# Patient Record
Sex: Male | Born: 1935 | Race: White | Hispanic: No | Marital: Single | Smoking: Current every day smoker
Health system: Southern US, Community
[De-identification: ages and names within clinical notes are randomized; demographics above are authoritative.]

## PROBLEM LIST (undated history)

## (undated) DIAGNOSIS — K219 Gastro-esophageal reflux disease without esophagitis: Secondary | ICD-10-CM

## (undated) DIAGNOSIS — J410 Simple chronic bronchitis: Secondary | ICD-10-CM

## (undated) DIAGNOSIS — N529 Male erectile dysfunction, unspecified: Secondary | ICD-10-CM

## (undated) DIAGNOSIS — N400 Enlarged prostate without lower urinary tract symptoms: Secondary | ICD-10-CM

## (undated) DIAGNOSIS — J449 Chronic obstructive pulmonary disease, unspecified: Secondary | ICD-10-CM

## (undated) DIAGNOSIS — I10 Essential (primary) hypertension: Secondary | ICD-10-CM

## (undated) HISTORY — PX: INGUINAL HERNIA REPAIR: SUR1180

---

## 2012-12-14 ENCOUNTER — Inpatient Hospital Stay: Payer: Self-pay | Admitting: Internal Medicine

## 2012-12-14 LAB — DIFFERENTIAL
Basophil #: 0 10*3/uL (ref 0.0–0.1)
Eosinophil %: 0.3 %
Lymphocyte #: 1.1 10*3/uL (ref 1.0–3.6)
Lymphocyte %: 9.8 %
Monocyte #: 0.6 x10 3/mm (ref 0.2–1.0)
Monocyte %: 5.3 %

## 2012-12-14 LAB — COMPREHENSIVE METABOLIC PANEL
Albumin: 3.3 g/dL — ABNORMAL LOW (ref 3.4–5.0)
Alkaline Phosphatase: 80 U/L (ref 50–136)
Anion Gap: 8 (ref 7–16)
Calcium, Total: 6.8 mg/dL — CL (ref 8.5–10.1)
Chloride: 99 mmol/L (ref 98–107)
Creatinine: 1 mg/dL (ref 0.60–1.30)
EGFR (African American): >60
EGFR (Non-African Amer.): >60
Glucose: 186 mg/dL — ABNORMAL HIGH (ref 65–99)
Potassium: 2.7 mmol/L — ABNORMAL LOW (ref 3.5–5.1)
SGPT (ALT): 22 U/L (ref 12–78)

## 2012-12-14 LAB — CBC
MCH: 30.6 pg (ref 26.0–34.0)
Platelet: 254 10*3/uL (ref 150–440)
RBC: 4.39 10*6/uL — ABNORMAL LOW (ref 4.40–5.90)
RDW: 14.2 % (ref 11.5–14.5)
WBC: 10.9 10*3/uL — ABNORMAL HIGH (ref 3.8–10.6)

## 2012-12-14 LAB — URINALYSIS, COMPLETE
Bacteria: NONE SEEN
Glucose,UR: NEGATIVE mg/dL (ref 0–75)
Ketone: NEGATIVE
Leukocyte Esterase: NEGATIVE
Nitrite: NEGATIVE
Ph: 6 (ref 4.5–8.0)
Protein: NEGATIVE
RBC,UR: <1 /HPF (ref 0–5)
WBC UR: 1 /HPF (ref 0–5)

## 2012-12-14 LAB — MAGNESIUM: Magnesium: <0.3 mg/dL

## 2012-12-14 LAB — TROPONIN I: Troponin-I: 0.03 ng/mL

## 2012-12-14 LAB — ETHANOL: Ethanol %: <0.003 % (ref 0.000–0.080)

## 2012-12-15 LAB — COMPREHENSIVE METABOLIC PANEL
Albumin: 2.9 g/dL — ABNORMAL LOW (ref 3.4–5.0)
Alkaline Phosphatase: 59 U/L (ref 50–136)
BUN: 8 mg/dL (ref 7–18)
Calcium, Total: 7.3 mg/dL — ABNORMAL LOW (ref 8.5–10.1)
Co2: 29 mmol/L (ref 21–32)
Creatinine: 0.88 mg/dL (ref 0.60–1.30)
EGFR (Non-African Amer.): >60
Osmolality: 272 (ref 275–301)
Potassium: 3.8 mmol/L (ref 3.5–5.1)
SGPT (ALT): 20 U/L (ref 12–78)

## 2012-12-15 LAB — CBC WITH DIFFERENTIAL/PLATELET
Basophil %: 0.5 %
Eosinophil #: 0.1 10*3/uL (ref 0.0–0.7)
HCT: 34.8 % — ABNORMAL LOW (ref 40.0–52.0)
Lymphocyte #: 1 10*3/uL (ref 1.0–3.6)
MCH: 30.6 pg (ref 26.0–34.0)
MCHC: 34.2 g/dL (ref 32.0–36.0)
Monocyte %: 6.5 %
Neutrophil #: 7.4 10*3/uL — ABNORMAL HIGH (ref 1.4–6.5)
Neutrophil %: 81.1 %
Platelet: 222 10*3/uL (ref 150–440)
WBC: 9.1 10*3/uL (ref 3.8–10.6)

## 2012-12-15 LAB — MAGNESIUM: Magnesium: 1.6 mg/dL — ABNORMAL LOW

## 2012-12-16 LAB — BASIC METABOLIC PANEL
Calcium, Total: 7.6 mg/dL — ABNORMAL LOW (ref 8.5–10.1)
Chloride: 103 mmol/L (ref 98–107)
EGFR (African American): >60
EGFR (Non-African Amer.): >60
Glucose: 112 mg/dL — ABNORMAL HIGH (ref 65–99)
Potassium: 3.9 mmol/L (ref 3.5–5.1)
Sodium: 134 mmol/L — ABNORMAL LOW (ref 136–145)

## 2013-01-26 ENCOUNTER — Inpatient Hospital Stay: Payer: Self-pay | Admitting: Internal Medicine

## 2013-01-26 LAB — URINALYSIS, COMPLETE
Bilirubin,UR: NEGATIVE
Blood: NEGATIVE
Glucose,UR: NEGATIVE mg/dL (ref 0–75)
Hyaline Cast: 1
Ketone: NEGATIVE
Nitrite: NEGATIVE
Protein: NEGATIVE
Specific Gravity: 1.009 (ref 1.003–1.030)
Squamous Epithelial: NONE SEEN

## 2013-01-26 LAB — CBC
HCT: 29.6 % — ABNORMAL LOW (ref 40.0–52.0)
HGB: 9.2 g/dL — ABNORMAL LOW (ref 13.0–18.0)
MCH: 27.9 pg (ref 26.0–34.0)
MCHC: 31.1 g/dL — ABNORMAL LOW (ref 32.0–36.0)
Platelet: 228 10*3/uL (ref 150–440)

## 2013-01-26 LAB — DRUG SCREEN, URINE
Amphetamines, Ur Screen: NEGATIVE (ref ?–1000)
Barbiturates, Ur Screen: NEGATIVE (ref ?–200)
Methadone, Ur Screen: NEGATIVE (ref ?–300)
Opiate, Ur Screen: NEGATIVE (ref ?–300)
Phencyclidine (PCP) Ur S: NEGATIVE (ref ?–25)
Tricyclic, Ur Screen: NEGATIVE (ref ?–1000)

## 2013-01-26 LAB — COMPREHENSIVE METABOLIC PANEL
Albumin: 2.5 g/dL — ABNORMAL LOW (ref 3.4–5.0)
Alkaline Phosphatase: 167 U/L — ABNORMAL HIGH
Anion Gap: 17 — ABNORMAL HIGH (ref 7–16)
Bilirubin,Total: 0.8 mg/dL (ref 0.2–1.0)
Calcium, Total: 5.6 mg/dL — CL (ref 8.5–10.1)
Creatinine: 1.03 mg/dL (ref 0.60–1.30)
EGFR (Non-African Amer.): >60
Glucose: 142 mg/dL — ABNORMAL HIGH (ref 65–99)
Osmolality: 273 (ref 275–301)
SGOT(AST): 52 U/L — ABNORMAL HIGH (ref 15–37)
SGPT (ALT): 35 U/L (ref 12–78)
Total Protein: 6.7 g/dL (ref 6.4–8.2)

## 2013-01-26 LAB — PRO B NATRIURETIC PEPTIDE: B-Type Natriuretic Peptide: 22886 pg/mL — ABNORMAL HIGH (ref 0–450)

## 2013-01-26 LAB — KETONES, URINE: Ketone: NEGATIVE

## 2013-01-26 LAB — MAGNESIUM: Magnesium: <0.2 mg/dL — ABNORMAL LOW

## 2013-01-26 LAB — ETHANOL: Ethanol %: 0.007 % (ref 0.000–0.080)

## 2013-01-26 LAB — ACETAMINOPHEN LEVEL: Acetaminophen: <2 ug/mL

## 2013-01-26 LAB — TROPONIN I
Troponin-I: 0.09 ng/mL — ABNORMAL HIGH
Troponin-I: 0.08 ng/mL — ABNORMAL HIGH

## 2013-01-26 LAB — SALICYLATE LEVEL: Salicylates, Serum: 4.3 mg/dL — ABNORMAL HIGH

## 2013-01-26 LAB — LIPASE, BLOOD: Lipase: 88 U/L (ref 73–393)

## 2013-01-26 LAB — TSH: Thyroid Stimulating Horm: 1.9 u[IU]/mL

## 2013-01-27 DIAGNOSIS — I059 Rheumatic mitral valve disease, unspecified: Secondary | ICD-10-CM

## 2013-01-27 LAB — MAGNESIUM: Magnesium: 2.2 mg/dL

## 2013-01-27 LAB — HEMOGLOBIN A1C: Hemoglobin A1C: 5 % (ref 4.2–6.3)

## 2013-01-27 LAB — CBC WITH DIFFERENTIAL/PLATELET
Basophil %: 0.2 %
Eosinophil #: 0.1 10*3/uL (ref 0.0–0.7)
Eosinophil %: 0.6 %
HCT: 25.1 % — ABNORMAL LOW (ref 40.0–52.0)
HGB: 8.3 g/dL — ABNORMAL LOW (ref 13.0–18.0)
MCH: 29.5 pg (ref 26.0–34.0)
Neutrophil #: 8.3 10*3/uL — ABNORMAL HIGH (ref 1.4–6.5)
Neutrophil %: 87.7 %
Platelet: 143 10*3/uL — ABNORMAL LOW (ref 150–440)
RBC: 2.81 10*6/uL — ABNORMAL LOW (ref 4.40–5.90)
RDW: 19.8 % — ABNORMAL HIGH (ref 11.5–14.5)
WBC: 9.4 10*3/uL (ref 3.8–10.6)

## 2013-01-27 LAB — BASIC METABOLIC PANEL
BUN: 15 mg/dL (ref 7–18)
Creatinine: 1.01 mg/dL (ref 0.60–1.30)
EGFR (African American): >60
EGFR (Non-African Amer.): >60
Glucose: 84 mg/dL (ref 65–99)
Potassium: 2.8 mmol/L — ABNORMAL LOW (ref 3.5–5.1)

## 2013-01-27 LAB — TSH: Thyroid Stimulating Horm: 0.8 u[IU]/mL

## 2013-01-28 LAB — CALCIUM: Calcium, Total: 6.2 mg/dL — CL (ref 8.5–10.1)

## 2013-01-31 LAB — CULTURE, BLOOD (SINGLE)

## 2013-02-23 LAB — BASIC METABOLIC PANEL
Anion Gap: 4 — ABNORMAL LOW (ref 7–16)
BUN: 11 mg/dL (ref 7–18)
CO2: 31 mmol/L (ref 21–32)
CREATININE: 0.91 mg/dL (ref 0.60–1.30)
Calcium, Total: 8.4 mg/dL — ABNORMAL LOW (ref 8.5–10.1)
Chloride: 95 mmol/L — ABNORMAL LOW (ref 98–107)
EGFR (African American): >60
EGFR (Non-African Amer.): >60
GLUCOSE: 125 mg/dL — AB (ref 65–99)
Osmolality: 262 (ref 275–301)
POTASSIUM: 3.6 mmol/L (ref 3.5–5.1)
SODIUM: 130 mmol/L — AB (ref 136–145)

## 2013-02-23 LAB — CBC
HCT: 32.5 % — ABNORMAL LOW (ref 40.0–52.0)
HGB: 10.6 g/dL — ABNORMAL LOW (ref 13.0–18.0)
MCH: 29.5 pg (ref 26.0–34.0)
MCHC: 32.5 g/dL (ref 32.0–36.0)
MCV: 91 fL (ref 80–100)
PLATELETS: 212 10*3/uL (ref 150–440)
RBC: 3.58 10*6/uL — ABNORMAL LOW (ref 4.40–5.90)
RDW: 19.2 % — ABNORMAL HIGH (ref 11.5–14.5)
WBC: 9.4 10*3/uL (ref 3.8–10.6)

## 2013-02-23 LAB — TROPONIN I: TROPONIN-I: 0.04 ng/mL

## 2013-02-23 LAB — PRO B NATRIURETIC PEPTIDE: B-TYPE NATIURETIC PEPTID: 24147 pg/mL — AB (ref 0–450)

## 2013-02-24 ENCOUNTER — Observation Stay: Payer: Self-pay | Admitting: Internal Medicine

## 2014-05-30 NOTE — Discharge Summary (Signed)
PATIENT NAME:  Daniel Edwards, Daniel Edwards MR#:  [id number] DATE OF BIRTH:  09-29-1934  DATE OF ADMISSION:  01/26/2013 DATE OF DISCHARGE:  01/28/2013  PRIMARY CARE PHYSICIAN:  None local.   DISCHARGE DIAGNOSES:   1.  Severe hypomagnesemia.  2.  Hypocalcemia.  3.  Sepsis with urinary tract infection.  4.  Left-sided pneumonia.  5.  Tobacco abuse.   6.  Anxiety.   7.  Anemia of chronic disease.  8.  Transaminitis secondary to alcohol abuse.  9.  Hypertension.   CONDITION: Stable.   CODE STATUS: Full code.   HOME MEDICATIONS: Please refer to the [facility] physician discharge instruction medication reconciliation list.    The patient needs home health and with physical therapy, rolling walker.   DIET: Low sodium, low fat, low cholesterol diet.   ACTIVITY: As tolerated.   FOLLOW-UP CARE: Follow with PCP within 1 to 2 weeks.   REASON FOR ADMISSION: Weakness and panic attacks.   HOSPITAL COURSE: The patient is a 80 year old Caucasian male with a history of alcohol abuse, hypertension and recent admission for nausea and vomiting, electrolyte abnormality, who presented to the ED with panic attack. In the ED, the patient received 1 gram of Ativan. He was found to have severe hypomagnesemia. Magnesium level is 0.2 for detailed history and physical examination, please refer to the admission note dictated by Dr. Darvin Neighbours.   LABORATORY DATA ON ADMISSION: Showed glucose 140 and BNP 22,806, BUN 13, creatinine 1.03, sodium 135, potassium 3.9, magnesium less than 0.2, calcium 5.6, anion gap 17,  ALT 35, alkaline phosphatase 167. Troponin 0.09. Urine drug screen is negative. WBC 15.3, hemoglobin 9.2. Urinalysis showed 1+ bacteriuria, WBC 50.   MEDICAL PROBLEM LIST:  1.  For severe electrolyte abnormality. After admission, the patient was treated with aggressive supplement of magnesium. The patient's magnesium level increased to 2.0.  2.  For hypokalemia the patient's potassium was at 2.8 and increased to 4.1 today.   3.  Hypocalcemia. The patient's calcium level was 5.6 and increased to 6.3. Corrected calcium is about 7.5. The patient was treated with a vitamin D with calcium supplement.  4. Sepsis with urinary tract infection and left-sided pneumonia. The patient has been treated with Levaquin and clindamycin. The patient has no complaints of cough, sputum, no fever, chills. The patient will be given Levaquin for 7 days to cover both pneumonia and urinary tract infection.  5.  The patient's WBC was decreased to normal range the antibiotics of treatment.  6.  For alcohol abuse. The patient has been treated with CIWA protocol.  7.  Elevated troponin, which is possibly due to cardiomyopathy, the patient got an echocardiograph, which showed ejection fraction 45% to 50%. The patient has been treated with aspirin, lisinopril and atenolol.  8.  Tobacco abuse. The patient was counseled to quit smoking.  9.  Anemia, stable.  10.  The patient has no complaints. Vital signs are stable. He is clinically stable and will be discharged to home with home health and physical therapy today, according to physical therapy recommendations. I discussed the patient's discharge plan with the patient, nurse and case manager.   TIME SPENT: About 38 minutes.   ____________________________ Demetrios Loll, MD qc:cc D: 01/28/2013 15:56:54 ET T: 01/28/2013 22:38:40 ET JOB#: [id number]  cc: Demetrios Loll, MD, <Dictator> Demetrios Loll MD ELECTRONICALLY SIGNED 01/29/2013 17:04

## 2014-05-30 NOTE — H&P (Signed)
PATIENT NAME:  Daniel Edwards, Daniel Edwards MR#:  [id number] DATE OF BIRTH:  [birth year]  DATE OF ADMISSION:  12/14/2012  PRIMARY CARE PHYSICIAN: None.  CHIEF COMPLAINT: Not feeling well.   HISTORY OF PRESENT ILLNESS: This is a 80 year old man who states that he has not been feeling well for the past 3 or 4 weeks. He has been feeling worse and worse. Some vague symptoms of nausea, vomiting once per day, most of the time in the morning. Some dizziness, trouble walking around and steadiness with his walking. He has had poor appetite and he has been drained with very low energy. On further questioning, he does drink 4 to 5 beers per day. In the ER, he was found to have a low potassium of 2.7, calcium that was low at 6.8 and hospitalist services were contacted for further evaluation for electrolyte abnormalities.   PAST MEDICAL HISTORY: Anemia in the past with blood transfusion and acid reflux.   PAST SURGICAL HISTORY: Hernia repair.   ALLERGIES: No known drug allergies.   HOME MEDICATIONS: Include: Omeprazole 20 mg b.i.d., TUMS p.r.n., ferrous sulfate 1 tablet daily, aspirin 325 mg, I am not sure if it is 2 tablets daily. I am not sure why he is taking that.  Lipo-Flavonoid vitamin.   SOCIAL HISTORY: Smokes 1 pack per day for numerous years. Drinks 4 to 5 beers per day. No drug use. He did work in Engineer, building services. He lives alone.   FAMILY HISTORY: Father died at 6 of an MI. Mother died at 67 of lung cancer.   REVIEW OF SYSTEMS:  No fever. Positive for hot and cold feeling. No weight loss. Positive for weakness, generalized.  EYES: He does wear glasses.  EARS, NOSE, MOUTH, AND THROAT:  Positive buzzing in the ears and allergies. No sore throat. No difficulty swallowing.  CARDIOVASCULAR: No chest pain. No palpitations.  RESPIRATORY: Occasional shortness of breath. No coughing. No sputum. No hemoptysis.  GASTROINTESTINAL: Positive for nausea and vomiting usually in the morning. No hematemesis. No abdominal  pain. Occasional diarrhea. No bright red blood per rectum. No melena.  GENITOURINARY: No burning on urination. No hematuria.  MUSCULOSKELETAL: Positive for leg cramps.  NEUROLOGIC: No fainting or blackouts, but unsteadiness with walking  PSYCHIATRIC: No anxiety, depression.  ENDOCRINE: No thyroid problems.  HEMATOLOGIC/LYMPHATIC: History of anemia in the past.   PHYSICAL EXAMINATION: VITAL SIGNS: Temperature 97.4, pulse 86, respirations 20, blood pressure 199/104, pulse oximetry 93% on room air.  GENERAL: No respiratory distress.  EYES: Conjunctivae and lids normal. Pupils equal, round, and reactive to light. Extraocular muscles intact. No nystagmus.  EARS, NOSE, MOUTH, AND THROAT: Tympanic membranes: No erythema. Nasal mucosa: No erythema. Throat: No erythema, no exudate seen. Lips and gums: No lesions.  NECK: No JVD. No bruits. No lymphadenopathy. No thyromegaly. No thyroid nodules palpated.  RESPIRATORY:  Lungs clear to auscultation. No use of accessory muscles to breathe. No rhonchi, rales, or wheeze heard.  CARDIOVASCULAR: S1, S2 normal. No gallops, rubs or murmurs heard. Carotid upstroke 2+ bilaterally. No bruits. Dorsalis pedis pulses 2+ bilaterally. Trace edema of the lower extremity.  ABDOMEN: Soft, nontender. No organomegaly/splenomegaly. Normoactive bowel sounds. No masses felt.  LYMPHATIC: No lymph nodes in the neck.  MUSCULOSKELETAL: Trace edema. No clubbing. No cyanosis.  NEUROLOGIC: Cranial nerves II through XII grossly intact. Deep tendon reflexes 2+ bilateral lower extremities. Power 5/5 upper and lower extremities bilaterally. Finger-nose intact bilaterally. Babinski negative.  PSYCHIATRIC: The patient is oriented to person, place and time.  LABORATORY AND RADIOLOGICAL DATA: EKG shows sinus tachycardia at 102 beats per minute. No acute ST-T wave changes. Troponin negative. Lipase 55, glucose 186, BUN 8, creatinine 1.0, sodium 135, potassium 2.7, chloride 99, CO2 of 28,  calcium 6.8. Liver function tests normal range. Albumin slightly low at 3.3. White blood cell count 10.9, hemoglobin and hematocrit 13.4 and 39.4, platelet count of 254. Chest x-ray: Mild COPD.  No acute abnormality noted. CT scan of the head: Atrophy and chronic microvascular ischemia. I added on ethanol level negative. TSH 0.79, phosphorus 3.2 and a magnesium less than 0.3. Urinalysis negative.   ASSESSMENT AND PLAN: 1.  Severe electrolyte abnormalities with severe hypomagnesemia, hypocalcemia and hypokalemia. ER physician replaced calcium IV. I will also give p.o., calcium supplementation. I will replace potassium IV orally and then IV fluids. For this severe magnesium deficiency, I will give 4 grams IV stat, another 2 grams later, and oral supplementation 400 mg of magnesium sulfate 3 times a day with a dose stat now. The electrolyte abnormalities could be secondary to alcohol abuse. I think a lot of the patient's symptoms are probably secondary to the electrolyte abnormalities. We will replace them and see how he feels.  2.  Alcohol abuse. Will give a stat dose of IV thiamine and put on oral CIWA protocol. No signs of tremor at this time. We will get a physical therapy evaluation for his unsteady gait.  3.  Nausea and vomiting. Supportive care with IV fluids. Could also be secondary to the electrolyte abnormalities. P.r.n. nausea medications. Will hold the aspirin at this time and give IV Protonix and guaiac stools.  4.  Unsteady gait. Could be secondary to alcohol abuse or the electrolyte abnormalities. We will get a physical therapy evaluation.  5.  Tobacco abuse. Smoking cessation counseling done 3 minutes by me. Nicotine patch p.r.n.  6.  Malignant hypertension. We will give a dose of lisinopril. We will add low-dose beta blocker if blood pressure is still elevated and with The CIWA protocol has p.r.n. clonidine.  7.  Impaired fasting glucose. We will put on sliding scale and check a hemoglobin  A1c.   TIME SPENT ON ADMISSION: 55 minutes.    ____________________________ Tana Conch. Leslye Peer, MD rjw:dp D: 12/14/2012 14:27:49 ET T: 12/14/2012 15:15:03 ET JOB#: [id number]  cc: Tana Conch. Leslye Peer, MD, <Dictator> Marisue Brooklyn MD ELECTRONICALLY SIGNED 12/14/2012 19:01

## 2014-05-30 NOTE — H&P (Signed)
PATIENT NAME:  Daniel Edwards, WOMBLES MR#:  [id number] DATE OF BIRTH:  1934/08/24  DATE OF ADMISSION:  01/26/2013  PRIMARY CARE PHYSICIAN:  None.   CHIEF COMPLAINT:  Weakness, panic attack.   HISTORY OF PRESENTING ILLNESS:  This is a 80 year old Caucasian male patient with a history of alcohol abuse, hypertension, a recent admission for nausea or vomiting, electrolyte abnormalities presents to the Emergency Room today complaining of a panic attack. This lasted a few minutes. In the Emergency Room the patient did received 1 g of Ativan. He has been found to be severely hypomagnesemic, hypocalcemic, and is being admitted to the hospitalist service. He mentions he drinks more than a 6 pack every day and states that he drinks a beer whenever he is thirsty.   The patient has been doing well over the last 6 weeks with a loss of weight, although not quantified. He has had gait disturbances, feeling extremely weak and had some nausea a few weeks prior but this has resolved.   He does have poor appetite, mostly drinks beer  all day.   PAST MEDICAL HISTORY:  1.  GERD.  2.  Alcohol abuse.  3.  Tobacco abuse.  4.  Hypertension.    PAST SURGICAL HISTORY:  Hernia repair.   ALLERGIES:  No known drug allergies.   SOCIAL HISTORY:  The patient smokes a pack a day. Drinks more than a 6 pack a day. He used to work in the past on rental properties. He presently lives alone. No illicit drug use.   FAMILY HISTORY:  Father died at age 60 of a MI. Mother died with lung cancer when she was 47.   HOME MEDICATIONS:  Include:  1.  Atenolol 100 mg oral once a day.  2.  Lisinopril 10 mg oral once a day.  3.  Unisom Sleep Gel 150 mg oral once a day at bedtime as needed.  4.  Ventolin HFA 2 puffs inhaled 4 times a day as needed.   REVIEW OF SYSTEMS:  CONSTITUTIONAL:  Complains of fatigue, weakness and anxiety.  EYES:  No blurred vision, pain, redness.  EARS, NOSE, THROAT:  No tinnitus, ear pain, hearing loss.   CARDIOVASCULAR:  No chest pain, orthopnea, did have lower extremity edema, which is better.  RESPIRATORY:  Occasional shortness of breath, wheezing, cough.  GASTROINTESTINAL:  Had nausea and vomiting previously, now resolved. No abdominal pain, diarrhea.  GENITOURINARY:  Has dysuria. No frequency.  MUSCULOSKELETAL:  Has some cramps on and off.  NEUROLOGIC:  Has decreased sensations in the legs with unsteady walk.  PSYCHIATRIC:  Has anxiety and panic attack today.  ENDOCRINE:  No polyuria, nocturia, thyroid problems. HEMATOLOGY AND LYMPHATIC:  Had anemia in the past.   PHYSICAL EXAMINATION:  VITAL SIGNS: Temperature 98.3, pulse of 115, respirations 26, blood pressure 173/103, saturating 97% on room air.  GENERAL:  Elderly Caucasian male patient lying in bed with fine tremors. Seems anxious, restless and fidgety. HEENT:  Atraumatic, normocephalic. Oral mucosa dry and pink. Pallor positive. No icterus. Pupils bilaterally equal and reactive to light.  NECK:  Supple. No thyromegaly or palpable lymph nodes. Trachea midline. No carotid bruits or JVD.  CARDIOVASCULAR:  S1, S2, tachycardic. No murmurs. Peripheral pulses 2+, 1+edema.  RESPIRATORY:  Has normal work of breathing, but left lower lobe bronchial breath sounds. Mild rhonchi.  GASTROINTESTINAL:  Soft abdomen, nontender. Bowel sounds present.  SKIN:  No petechiae, rash, ulcers.  NEUROLOGICAL:  Decreased sensations in the feet. Motor strength 5/5  in upper and lower extremities. Gait not tested. Cerebellar function normal.  LYMPHATIC:  No cervical lymphadenopathy.   LABORATORY STUDIES: Show glucose of 142. BNP of 22,886, BUN 13, creatinine 1.03, sodium 135, potassium 3.9, chloride 100, bicarb of 18 anion gap 17, calcium 5.6, magnesium less than 0.2. Lipase of 88, ethanol level of 7. AST of 52, ALT 35, alk phos of 167, bilirubin 0.8, albumin 2.5 with troponin 0.09. TSH of 1.9. Urine drug screen negative.   CBC shows WBC 15.3, hemoglobin 9.2,  platelets of 228. Urinalysis shows 1+ bacteria and 50. WBC.   Acetaminophen less than 2, salicylate 4.3.   The EKG shows sinus tachycardia with PVCs. Nonspecific ST-T wave changes.   Chest x-ray shows left-sided pneumonia.   ASSESSMENT AND PLAN:  1.  Severe electrolyte abnormalities, which includes severe hypomagnesemia and hypocalcemia. The patient is at high risk for cardiac arrest and death. Will be admitted to Critical Care Unit. We will aggressively replace both IV and p.o. This is likely secondary to his significant beer drinking. We will repeat once replaced to confirm that these are replenished. Vitamin D level was low recently. We will start him on 50,000 units vitamin D weekly along with calcium vitamin D pills. We will repeat an EKG in the morning to see his EKG changes have resolved.  2.  Sepsis with urinary tract infection and left-sided pneumonia. The patient has not had any vomiting but with his gastroesophageal reflux disease and significant alcohol intake, he could have aspiration pneumonia. We will start him on Levaquin but also clindamycin to this.  3.  Sepsis secondary to urinary tract infection and pneumonia.  4.  Generalized weakness secondary to the electrolyte abnormalities.  5.  Anxiety attack, likely alcohol withdrawals. Put him on CIWA protocol. I have counseled him to quit alcohol.  6.  Elevated troponin. I suspect the patient might have underlying cardiomyopathy with his elevated BNP of 22,000 and significant alcohol intake. We will check an echocardiogram. Presently he does not seem fluid overloaded, will need IV fluids. But he is on a beta blocker and ACE inhibitor.  7.  Tobacco abuse. The patient has been counseled to quit smoking greater than 3 minutes. I stressed to him the importance of quitting considering he has a family history of lung cancer and he continues to smoke.  8.  Anemia of chronic disease, stable.   9.  Increased anion gap with low bicarbonate. I  suspect the patient has acute anemia secondary to his alcohol intake and decreased food intake. We will check ketone levels. Glucose levels are normal.  10.  Transaminitis secondary to alcohol intake.  11.  Deep vein thrombosis prophylaxis with Lovenox.   CODE STATUS:  FULL CODE.   TIME SPENT TODAY ON THIS CRITICALLY ILL PATIENT:  Was 68 minutes.   ____________________________ Leia Alf Kayla Deshaies, MD srs:jm D: 01/26/2013 14:25:17 ET T: 01/26/2013 15:52:32 ET JOB#: [id number]  cc: Alveta Heimlich R. Karis Emig, MD, <Dictator> Neita Carp MD ELECTRONICALLY SIGNED 01/27/2013 10:34

## 2014-05-30 NOTE — Discharge Summary (Signed)
PATIENT NAME:  Daniel Edwards, Daniel Edwards MR#:  [id number] DATE OF BIRTH:  [birth year]  DATE OF ADMISSION:  12/14/2012 DATE OF DISCHARGE:  12/17/2012  ADMITTING DIAGNOSIS: Generalized weakness, unsteady gait.   DISCHARGE DIAGNOSES: 1.  Generalized weakness due to severe electrolyte abnormalities.  2.  Severe hypomagnesemia. 3.  Hypocalcemia.   4.  Severe hypokalemia.  5.  Alcohol abuse.  6.  malingant  hypertension.  7.  Nausea and vomiting possibly due to gastroenteritis, now resolved.  8.  Unsteady gait, likely related to electrolyte imbalance as well as dehydration. The patient's ambulation is improved. 9.  Tobacco abuse.  10.  Impaired fasting glucose with normal hemoglobin A1c.  11.  Possible chronic obstructive pulmonary disease.  CONSULTANTS: Physical therapy.   LABS:  Glucose 186, BUN 8, creatinine 1.0, sodium 135, potassium 2.7, chloride 99, CO2 was 28. His calcium was 6.8 on admission. Lipase was 55.  His LFTs were normal except albumin at 3.3. Troponin 0.03.  WBC count 10.9, hemoglobin 13.4. Platelet count was 254.   calcium ionized was low at 3.6.  His vitamin D level was low at 14.2.    CT scan of the head showed atrophy and chronic microvascular ischemia. PA and lateral chest x-ray showed mild COPD. No acute abnormality noted.    HOSPITAL COURSE:  Please refer to H and P done by the admitting physician.  The patient is a 80 year old white male who has not been feeling well for the past 3 to 4 weeks. He had some nausea, vomiting and diarrhea. The patient does drink 4 to 5 beers per day, also has a drink on a daily basis. Prior to that, he reports that 3 weeks prior he was drinking much more. The patient came to the ED with these symptoms and he was noted to have significant electrolyte imbalances, including potassium of 2.7, calcium of 6.8 and magnesium that was low. The patient was unsteady on his feet. He was also dehydrated. The patient was admitted and given IV fluids. Also, his  electrolytes were replaced. The patient started to feel much better once this was done. He had nausea and vomiting that had resolved. The patient also was noted to have elevated blood pressure. He had no history of hypertension, according to him; however, he does not follow up with any primary care provider. His blood pressure was noted to be elevated during hospitalization, and he had to be started on antihypertensives. I strongly recommended to him and his son that he needs to get a primary care physician to follow up with, so his blood pressure and other electrolyte imbalances could be followed. At this time, the patient is stable for discharge.   DISCHARGE MEDICATIONS: Aspirin 325, 2 tabs daily; lisinopril 10 daily, calcium carbonate 500, 3 tabs 4 times a day; atenolol 100 daily, iron sulfate 325 daily, mag-oxide 400 mg 1 tab p.o. q.8, Combivent 1 puff 4 times a day as needed.   Home health services and nurse referral is made.  The patient to have serial blood pressure measurements and these need to be reported to his primary care provider.     DIET: Low sodium.   ACTIVITY: As tolerated. The patient should use a cane at all times.   Follow up with Dr. Elijio Miles as a new patient.   The patient instructed to stop smoking and drinking.   NOTE:  35 minutes spent on this discharge.    ____________________________ Lafonda Mosses. Posey Pronto, MD shp:dmm D: 12/17/2012 20:03:00 ET T: 12/17/2012  21:00:15 ET JOB#: [id number]  cc: Isabellarose Kope H. Posey Pronto, MD, <Dictator> Alric Seton MD ELECTRONICALLY SIGNED 12/21/2012 16:50

## 2014-05-31 NOTE — Discharge Summary (Signed)
PATIENT NAME:  Daniel Edwards, Daniel Edwards MR#:  [id number] DATE OF BIRTH:  [birth year]  DATE OF ADMISSION:  02/24/2013 DATE OF DISCHARGE:  02/25/2013  ADMITTING PHYSICIAN: Dr. Valentino Nose.  DISCHARGING PHYSICIAN: Gladstone Lighter.   PRIMARY CARE PHYSICIAN: Non-local.   [place]: None.   DISCHARGE DIAGNOSES: 1.  Acute on chronic obstructive pulmonary disease exacerbation.  2.  Hypertension.  3.  Tobacco use disorder.   DISCHARGE HOME MEDICATIONS:  1.  Prednisone taper.  2.  Unisom SleepGels over-the-counter at bedtime p.r.n.  3.  Aspirin 81 mg p.o. daily.  4.  Magnesium oxide 400 mg p.o. b.i.d.  5.  Calcium with vitamin D 1 tablet p.o. b.i.d. 500 mg/200 International Unit strength.  6.  Lisinopril 10 mg p.o. daily.  7.  Atenolol 100 mg p.o. daily.  8.  Ventolin inhaler 2 puffs 4 times a day as needed for shortness of breath.  9.  Levaquin 500 mg p.o. once a day for 7 days.  10.  Advair 250/50, 1 puff b.i.d.   DISCHARGE DIET: Low-sodium diet.   DISCHARGE ACTIVITY: As tolerated.     FOLLOWUP INSTRUCTIONS:  PCP followup in 2 to 4 weeks.   LABORATORY DATA AND IMAGING STUDIES: Prior to discharge, sodium 130, potassium 3.6, chloride 95, bicarb 31, BUN 11, creatinine 0.91, glucose 125 and calcium of 8.4. BNP on admission was 24,147.   WBC 9.4, hemoglobin 10.6, hematocrit 32.5, platelet count is 212. Troponins were negative. Chest x-ray on admission showing mild patchy opacity along the lateral view. Lower lobe pneumonia cannot be excluded.   BRIEF HOSPITAL COURSE: [name] is a very pleasant 80 year old man with known history of COPD, ongoing smoking and hypertension, presents to the hospital secondary to worsening of breathing.  1.  Acute on chronic obstructive pulmonary disease exacerbation. Started on IV steroids, inhalers and nebs; improved his breathing tremendously. He was also requiring O2 when he initially came in and has been tapered off to room air. He has been  ambulating well without any further distress, and he is being discharged home on prednisone taper and inhalers as well. However, the patient's x-ray showed some left lower lobe possible opacity, though he did not have any fevers. Because of the cough and his underlying lung disease, he is being discharged on Levaquin.  2.  Hypertension, was well controlled in the hospital. His home medications of lisinopril and atenolol  were continued at the time of discharge.   DISCHARGE CONDITION: Stable.   DISCHARGE DISPOSITION: Home.   TIME SPENT ON DISCHARGE: 40 minutes.   ____________________________ Gladstone Lighter, MD rk:dmm D: 02/27/2013 15:33:34 ET T: 02/27/2013 20:32:03 ET JOB#: [id number]  cc: Gladstone Lighter, MD, <Dictator> Gladstone Lighter MD ELECTRONICALLY SIGNED 03/07/2013 13:33

## 2014-05-31 NOTE — H&P (Signed)
PATIENT NAME:  Daniel Edwards, Daniel Edwards MR#:  [id number] DATE OF BIRTH:  February 20, 1934  DATE OF ADMISSION:  02/24/2013  REFERRING PHYSICIAN:  Dr. Lisa Roca.   PRIMARY CARE PHYSICIAN:  Nonlocal.   CHIEF COMPLAINT:  Shortness of breath.   HISTORY OF PRESENT ILLNESS:  A 80 year old Caucasian gentleman with history of COPD, hypertension, presenting with shortness of breath, described one week duration of gradual onset worsening shortness of breath, mainly described as dyspnea on exertion, however worse the day of admission prompting him to come to the Emergency Department, said he just could not catch his breath.  He had associated nonproductive cough which is chronic in nature.  No fevers, chills, no orthopnea or PND; however, he does have positive edema which has been greater for many weeks, stating that it started whenever he was started on new medication for his hypertension.  When he was attempting to ambulate in the Emergency Department O2 saturations dropping down into the 70s requiring supplemental O2 and thus prompting admission.   REVIEW OF SYSTEMS:  CONSTITUTIONAL:  Denies fever, fatigue, weakness, pain. EYES:  Denies blurred vision, double vision, eye pain. EARS, NOSE, THROAT:  Denies ear pain, hearing loss.  RESPIRATORY:  Positive for chronic cough and shortness of breath.  Denies wheeze, hemoptysis. CARDIOVASCULAR:  Denies chest pain, palpitations.  Positive for edema.  Denies orthopnea.  GASTROINTESTINAL:  Denies nausea, vomiting, diarrhea, abdominal pain. GENITOURINARY:  Denies dysuria, hematuria.  ENDOCRINE:  Denies nocturia or thyroid problems. HEMATOLOGY AND LYMPHATIC:  Denies easy bruising or bleeding.  SKIN:  Denies rash or lesions.  MUSCULOSKELETAL:  Pain in neck, back, shoulder, knees, hips or arthritic symptoms.  NEUROLOGIC:  Denies paralysis, paresthesia.  PSYCHIATRIC:  Denies any anxiety or depressive symptoms.  Otherwise, full review of systems performed by me is negative.    PAST MEDICAL HISTORY:  Hypertension, COPD.   SOCIAL HISTORY:  Every day tobacco use.  Occasional alcohol use.  No drug usage.   FAMILY HISTORY:  Positive for asthma.  No other known cardiovascular or pulmonary diseases.   ALLERGIES:  No known allergies.   HOME MEDICATIONS:  Include lisinopril 10 mg by mouth daily, aspirin 81 mg by mouth daily, atenolol 100 mg by mouth daily.   PHYSICAL EXAMINATION: VITAL SIGNS:  Temperature 97.4, heart rate 88, respirations 24, blood pressure 169/109, saturating 99% on supplemental O2.  Weight 79.4 kg, BMI 25.1.  GENERAL:  Well-nourished, well-developed Caucasian gentleman in no acute distress.  HEAD:  Normocephalic, atraumatic.  EYES:  Pupils equal, round, reactive to light.  Extraocular muscles intact.  No scleral icterus.  MOUTH:  Moist mucous membranes.  Dentition intact.  No abscess noted.  EAR, NOSE, THROAT:  Throat clear without exudates.  No external lesions.  NECK:  Supple.  No thyromegaly.  No nodules.  No JVD.  PULMONARY:  Clear to auscultation bilaterally without wheezes, rubs or rhonchi, however greatly diminished breath sounds throughout.  No use of accessory muscles.  Good effort.  CHEST:  Nontender to palpation.  CARDIOVASCULAR:  S1, S2, regular rate and rhythm.  No murmurs, rubs, or gallops.  No edema.  Pedal pulses 2+ bilaterally.  GASTROINTESTINAL:  Soft, nontender, nondistended.  No masses.  Positive bowel sounds.  No hepatosplenomegaly.  MUSCULOSKELETAL:  No swelling, clubbing.  Positive edema 2+ bilaterally to the shin.  Range of motion full in all extremities.  NEUROLOGIC:  Cranial nerves II through XII intact.  No gross focal neurological deficits.  Reflexes intact.  Sensation intact.  SKIN:  No ulcerations, lesions, rashes, cyanosis.  Skin warm, dry.  Turgor is intact.  PSYCHIATRIC:  Mood and affect within normal limits.  The patient was alert and oriented x 3.  Insight and judgment intact.   LABORATORY DATA:  Chest x-ray  revealing mild patchy opacity along the posterior costophrenic sulcus, only present in the lateral view.  Sodium 130, potassium 3.6, chloride 95, bicarb 31, BUN 11, creatinine 0.91, glucose 125, BNP 24,147, troponin I 0.04, WBC 9.4, hemoglobin is 10.6, platelets of 212.  EKG, normal sinus rhythm.   ASSESSMENT AND PLAN:  A 80 year old gentleman with history of chronic obstructive pulmonary disease, hypertension, presenting with shortness of breath.  1.  Chronic obstructive pulmonary disease exacerbation.  Provide DuoNeb therapy q. 4 hours, supplemental O2 to keep oxygen saturation greater than 92%, Solu-Medrol 60 mg daily.  No indication for azithromycin as no new change in sputum production or cough.  Add incentive spirometry.  The patient recently had echocardiogram revealing ejection fraction of 50%.  Doubt this is changed in the last week.  2.  Hypertension.  Continue lisinopril and atenolol.  3.  VTE prophylaxis with heparin subQ. 4.  CODE STATUS:  THE PATIENT IS A FULL CODE.  TIME SPENT:  45 minutes.   ____________________________ Aaron Mose. Fuquan Wilson, MD dkh:ea D: 02/24/2013 00:51:07 ET T: 02/24/2013 01:46:27 ET JOB#: [id number]  cc: Aaron Mose. Aubryana Vittorio, MD, <Dictator> Jerricka Carvey Woodfin Ganja MD ELECTRONICALLY SIGNED 02/25/2013 1:10

## 2015-02-11 DIAGNOSIS — R0602 Shortness of breath: Secondary | ICD-10-CM | POA: Diagnosis not present

## 2015-02-11 DIAGNOSIS — I10 Essential (primary) hypertension: Secondary | ICD-10-CM | POA: Diagnosis not present

## 2015-02-11 DIAGNOSIS — Z23 Encounter for immunization: Secondary | ICD-10-CM | POA: Diagnosis not present

## 2015-02-11 DIAGNOSIS — F1010 Alcohol abuse, uncomplicated: Secondary | ICD-10-CM | POA: Diagnosis not present

## 2015-02-11 DIAGNOSIS — Z720 Tobacco use: Secondary | ICD-10-CM | POA: Diagnosis not present

## 2015-02-11 DIAGNOSIS — N529 Male erectile dysfunction, unspecified: Secondary | ICD-10-CM | POA: Diagnosis not present

## 2015-02-11 DIAGNOSIS — J410 Simple chronic bronchitis: Secondary | ICD-10-CM | POA: Diagnosis not present

## 2015-02-18 ENCOUNTER — Encounter: Payer: Self-pay | Admitting: *Deleted

## 2015-02-18 ENCOUNTER — Emergency Department: Payer: PPO

## 2015-02-18 ENCOUNTER — Observation Stay
Admission: EM | Admit: 2015-02-18 | Discharge: 2015-02-20 | Disposition: A | Discharge status: Home / Self Care | Payer: PPO | Attending: Internal Medicine | Admitting: Internal Medicine

## 2015-02-18 DIAGNOSIS — K7689 Other specified diseases of liver: Secondary | ICD-10-CM | POA: Diagnosis not present

## 2015-02-18 DIAGNOSIS — R000 Tachycardia, unspecified: Secondary | ICD-10-CM | POA: Insufficient documentation

## 2015-02-18 DIAGNOSIS — I214 Non-ST elevation (NSTEMI) myocardial infarction: Secondary | ICD-10-CM | POA: Diagnosis not present

## 2015-02-18 DIAGNOSIS — R0789 Other chest pain: Secondary | ICD-10-CM | POA: Diagnosis not present

## 2015-02-18 DIAGNOSIS — R0902 Hypoxemia: Secondary | ICD-10-CM | POA: Insufficient documentation

## 2015-02-18 DIAGNOSIS — R531 Weakness: Secondary | ICD-10-CM | POA: Diagnosis not present

## 2015-02-18 DIAGNOSIS — E876 Hypokalemia: Secondary | ICD-10-CM | POA: Insufficient documentation

## 2015-02-18 DIAGNOSIS — R079 Chest pain, unspecified: Secondary | ICD-10-CM | POA: Diagnosis present

## 2015-02-18 DIAGNOSIS — I10 Essential (primary) hypertension: Secondary | ICD-10-CM | POA: Diagnosis not present

## 2015-02-18 DIAGNOSIS — K219 Gastro-esophageal reflux disease without esophagitis: Secondary | ICD-10-CM | POA: Diagnosis not present

## 2015-02-18 DIAGNOSIS — Z7982 Long term (current) use of aspirin: Secondary | ICD-10-CM | POA: Diagnosis not present

## 2015-02-18 DIAGNOSIS — K8020 Calculus of gallbladder without cholecystitis without obstruction: Secondary | ICD-10-CM | POA: Diagnosis not present

## 2015-02-18 DIAGNOSIS — N2889 Other specified disorders of kidney and ureter: Secondary | ICD-10-CM | POA: Diagnosis not present

## 2015-02-18 DIAGNOSIS — R42 Dizziness and giddiness: Secondary | ICD-10-CM | POA: Diagnosis not present

## 2015-02-18 DIAGNOSIS — N281 Cyst of kidney, acquired: Secondary | ICD-10-CM | POA: Diagnosis not present

## 2015-02-18 DIAGNOSIS — I248 Other forms of acute ischemic heart disease: Secondary | ICD-10-CM | POA: Diagnosis not present

## 2015-02-18 DIAGNOSIS — E860 Dehydration: Secondary | ICD-10-CM | POA: Diagnosis not present

## 2015-02-18 DIAGNOSIS — K5730 Diverticulosis of large intestine without perforation or abscess without bleeding: Secondary | ICD-10-CM | POA: Insufficient documentation

## 2015-02-18 DIAGNOSIS — R160 Hepatomegaly, not elsewhere classified: Secondary | ICD-10-CM

## 2015-02-18 DIAGNOSIS — R748 Abnormal levels of other serum enzymes: Secondary | ICD-10-CM | POA: Insufficient documentation

## 2015-02-18 DIAGNOSIS — D1803 Hemangioma of intra-abdominal structures: Secondary | ICD-10-CM | POA: Insufficient documentation

## 2015-02-18 DIAGNOSIS — K529 Noninfective gastroenteritis and colitis, unspecified: Secondary | ICD-10-CM | POA: Diagnosis not present

## 2015-02-18 DIAGNOSIS — R05 Cough: Secondary | ICD-10-CM | POA: Insufficient documentation

## 2015-02-18 DIAGNOSIS — K5790 Diverticulosis of intestine, part unspecified, without perforation or abscess without bleeding: Secondary | ICD-10-CM | POA: Insufficient documentation

## 2015-02-18 DIAGNOSIS — E871 Hypo-osmolality and hyponatremia: Secondary | ICD-10-CM | POA: Diagnosis not present

## 2015-02-18 DIAGNOSIS — R61 Generalized hyperhidrosis: Secondary | ICD-10-CM | POA: Diagnosis not present

## 2015-02-18 DIAGNOSIS — F1010 Alcohol abuse, uncomplicated: Secondary | ICD-10-CM | POA: Diagnosis not present

## 2015-02-18 DIAGNOSIS — Z9889 Other specified postprocedural states: Secondary | ICD-10-CM | POA: Insufficient documentation

## 2015-02-18 HISTORY — DX: Gastro-esophageal reflux disease without esophagitis: K21.9

## 2015-02-18 HISTORY — DX: Essential (primary) hypertension: I10

## 2015-02-18 LAB — TROPONIN I: TROPONIN I: 0.04 ng/mL — AB (ref <0.031)

## 2015-02-18 LAB — COMPREHENSIVE METABOLIC PANEL
ALT: 16 U/L — AB (ref 17–63)
ANION GAP: 14 (ref 5–15)
AST: 25 U/L (ref 15–41)
Albumin: 4 g/dL (ref 3.5–5.0)
Alkaline Phosphatase: 59 U/L (ref 38–126)
BUN: 8 mg/dL (ref 6–20)
CHLORIDE: 93 mmol/L — AB (ref 101–111)
CO2: 24 mmol/L (ref 22–32)
Calcium: 7.2 mg/dL — ABNORMAL LOW (ref 8.9–10.3)
Creatinine, Ser: 0.82 mg/dL (ref 0.61–1.24)
Glucose, Bld: 163 mg/dL — ABNORMAL HIGH (ref 65–99)
POTASSIUM: 3 mmol/L — AB (ref 3.5–5.1)
Sodium: 131 mmol/L — ABNORMAL LOW (ref 135–145)
TOTAL PROTEIN: 7.7 g/dL (ref 6.5–8.1)
Total Bilirubin: 0.8 mg/dL (ref 0.3–1.2)

## 2015-02-18 LAB — CBC WITH DIFFERENTIAL/PLATELET
BASOS ABS: 0.1 10*3/uL (ref 0–0.1)
BASOS PCT: 1 %
Eosinophils Absolute: 0 10*3/uL (ref 0–0.7)
Eosinophils Relative: 0 %
HCT: 41.6 % (ref 40.0–52.0)
HEMOGLOBIN: 13.7 g/dL (ref 13.0–18.0)
Lymphocytes Relative: 4 %
Lymphs Abs: 0.5 10*3/uL — ABNORMAL LOW (ref 1.0–3.6)
MCH: 29 pg (ref 26.0–34.0)
MCHC: 32.9 g/dL (ref 32.0–36.0)
MCV: 87.9 fL (ref 80.0–100.0)
MONOS PCT: 3 %
Monocytes Absolute: 0.5 10*3/uL (ref 0.2–1.0)
NEUTROS PCT: 92 %
Neutro Abs: 12.7 10*3/uL — ABNORMAL HIGH (ref 1.4–6.5)
Platelets: 249 10*3/uL (ref 150–440)
RBC: 4.73 MIL/uL (ref 4.40–5.90)
RDW: 13.5 % (ref 11.5–14.5)
WBC: 13.8 10*3/uL — AB (ref 3.8–10.6)

## 2015-02-18 LAB — LIPASE, BLOOD: LIPASE: 17 U/L (ref 11–51)

## 2015-02-18 MED ORDER — ONDANSETRON 4 MG PO TBDP
4.0000 mg | ORAL_TABLET | Freq: Once | ORAL | Status: AC | PRN
  Administered 2015-02-18: 4 mg via ORAL

## 2015-02-18 MED ORDER — IOHEXOL 240 MG/ML SOLN
25.0000 mL | Freq: Once | INTRAMUSCULAR | Status: AC | PRN
  Administered 2015-02-18: 25 mL via ORAL

## 2015-02-18 MED ORDER — ONDANSETRON HCL 4 MG/2ML IJ SOLN
4.0000 mg | Freq: Once | INTRAMUSCULAR | Status: AC
  Administered 2015-02-18: 4 mg via INTRAVENOUS
  Filled 2015-02-18: qty 2

## 2015-02-18 MED ORDER — SODIUM CHLORIDE 0.9 % IV BOLUS (SEPSIS)
1000.0000 mL | Freq: Once | INTRAVENOUS | Status: AC
  Administered 2015-02-18: 1000 mL via INTRAVENOUS

## 2015-02-18 MED ORDER — POTASSIUM CHLORIDE 20 MEQ PO PACK
40.0000 meq | PACK | Freq: Two times a day (BID) | ORAL | Status: DC
  Administered 2015-02-19 (×3): 40 meq via ORAL
  Filled 2015-02-18 (×3): qty 2

## 2015-02-18 MED ORDER — ONDANSETRON 4 MG PO TBDP
ORAL_TABLET | ORAL | Status: AC
  Filled 2015-02-18: qty 1

## 2015-02-18 NOTE — ED Notes (Signed)
Pt with nausea , meds given.

## 2015-02-18 NOTE — ED Provider Notes (Signed)
[facility] Emergency Department Provider Note  ____________________________________________  Time seen: 11:20 PM  I have reviewed the triage vital signs and the nursing notes.   HISTORY  Chief Complaint Emesis and Diarrhea    HPI Daniel Edwards is a 81 y.o. male presents with episodes of nonbloody vomiting and diarrhea today. Patient also admits to generalized weakness and dizziness. Patient denies any chest pain no shortness of breath or headache.   No past medical history on file.  There are no active problems to display for this patient.   No past surgical history on file.  No current outpatient prescriptions on file.  Allergies Review of patient's allergies indicates no known allergies.  No family history on file.  Social History Social History  Substance Use Topics  . Smoking status: Current Every Day Smoker  . Smokeless tobacco: Not on file  . Alcohol Use: Yes    Review of Systems  Constitutional: Negative for fever. Eyes: Negative for visual changes. ENT: Negative for sore throat. Cardiovascular: Negative for chest pain. Respiratory: Negative for shortness of breath. Gastrointestinal: Negative for abdominal pain, vomiting and diarrhea. Genitourinary: Negative for dysuria. Musculoskeletal: Negative for back pain. Skin: Negative for rash. Neurological:  positive for generalized weakness and dizziness point ROS otherwise negative.  ____________________________________________   PHYSICAL EXAM:  VITAL SIGNS: ED Triage Vitals  Enc Vitals Group     BP 02/18/15 2046 156/75 mmHg     Pulse Rate 02/18/15 2046 98     Resp 02/18/15 2046 20     Temp 02/18/15 2046 98.5 F (36.9 C)     Temp Source 02/18/15 2046 Oral     SpO2 02/18/15 2046 92 %     Weight 02/18/15 2046 186 lb (84.369 kg)     Height 02/18/15 2046 5\' 10"  (1.778 m)     Head Cir --      Peak Flow --      Pain Score 02/18/15 2050 10     Pain Loc --      Pain Edu?  --      Excl. in [place]? --      Constitutional: Alert and oriented. Well appearing and in no distress. Eyes: Conjunctivae are normal. PERRL. Normal extraocular movements. ENT   Head: Normocephalic and atraumatic.   Nose: No congestion/rhinnorhea.   Mouth/Throat: Mucous membranes are moist.   Neck: No stridor. Hematological/Lymphatic/Immunilogical: No cervical lymphadenopathy. Cardiovascular: Normal rate, regular rhythm. Normal and symmetric distal pulses are present in all extremities. No murmurs, rubs, or gallops. Respiratory: Normal respiratory effort without tachypnea nor retractions. Breath sounds are clear and equal bilaterally. No wheezes/rales/rhonchi. Gastrointestinal: Soft and nontender. No distention. There is no CVA tenderness. Genitourinary: deferred Musculoskeletal: Nontender with normal range of motion in all extremities. No joint effusions.  No lower extremity tenderness nor edema. Neurologic:  Normal speech and language. No gross focal neurologic deficits are appreciated. Speech is normal.  Skin:  Skin is warm, dry and intact. No rash noted. Psychiatric: Mood and affect are normal. Speech and behavior are normal. Patient exhibits appropriate insight and judgment.  ____________________________________________    LABS (pertinent positives/negatives)  Labs Reviewed  CBC WITH DIFFERENTIAL/PLATELET - Abnormal; Notable for the following:    WBC 13.8 (*)    Neutro Abs 12.7 (*)    Lymphs Abs 0.5 (*)    All other components within normal limits  COMPREHENSIVE METABOLIC PANEL - Abnormal; Notable for the following:    Sodium 131 (*)    Potassium 3.0 (*)  Chloride 93 (*)    Glucose, Bld 163 (*)    Calcium 7.2 (*)    ALT 16 (*)    All other components within normal limits  TROPONIN I - Abnormal; Notable for the following:    Troponin I 0.04 (*)    All other components within normal limits  LIPASE, BLOOD  URINALYSIS COMPLETEWITH MICROSCOPIC ([facility] ONLY)   FIBRIN DERIVATIVES D-DIMER ([facility] ONLY)     ____________________________________________   EKG  ED ECG REPORT I, BROWN, [place] N, the attending physician, personally viewed and interpreted this ECG.   Date: 02/20/2015  EKG Time: 8:59 PM  Rate: 95  Rhythm: Normal sinus rhythm  Axis: None  Intervals: Normal  ST&T Change: None   ____________________________________________    RADIOLOGY  CT Abdomen Pelvis W Contrast (Final result) Result time: 02/19/15 01:16:29   Final result by Rad Results In Interface (02/19/15 01:16:29)   Narrative:   CLINICAL DATA: Vomiting and diarrhea, onset yesterday. Weakness and dizziness.  EXAM: CT ABDOMEN AND PELVIS WITH CONTRAST  TECHNIQUE: Multidetector CT imaging of the abdomen and pelvis was performed using the standard protocol following bolus administration of intravenous contrast.  CONTRAST: 19mL OMNIPAQUE IOHEXOL 350 MG/ML SOLN  COMPARISON: None.  FINDINGS: Lower chest: The included lung bases are clear.  Liver: There are multiple hypodense liver lesions scattered throughout the right and left lobe. Largest lesion in the dome measures 1.5 cm, with slightly irregular margins. There is question of capsular retraction on the coronal reformat. There is a 1.3 cm lesion in the subcapsular right lobe. Additional smaller lesions are seen scattered throughout both lobes. These are incompletely characterized.  Hepatobiliary: Gallbladder physiologically distended, no calcified stone. No biliary dilatation.  Pancreas: Atrophic pancreas without ductal dilatation or inflammation.  Spleen: Normal.  Adrenal glands: No nodule. Mild thickening bilaterally.  Kidneys: Symmetric renal enhancement. No hydronephrosis. Multiple cystic lesions within both kidneys. Arising from the lower left kidney is a 4.1 cm cyst with septation versus 2 adjacent cysts. Questionable peripheral calcification about the inferior aspect. Exophytic  cystic lesion arises from the upper left kidney measuring 3 cm. All of the cysts measure simple fluid density.  Stomach/Bowel: Stomach distended with enteric contrast There are no dilated or thickened small bowel loops. Oral contrast reaches to the distal small bowel moderate volume of stool in the proximal colon, small volume of stool with distal colon. There is distal colonic diverticulosis without diverticulitis. The appendix is not seen, no pericecal or right lower quadrant inflammatory change.  Vascular/Lymphatic: No retroperitoneal adenopathy. Abdominal aorta is normal in caliber. Dense atherosclerosis of the abdominal aorta without aneurysm.  Reproductive: Heterogeneous prostate gland, prominent size.  Bladder: No wall thickening. Probable left lateral bladder diverticulum.  Other: No free air, free fluid, or intra-abdominal fluid collection.  Musculoskeletal: There are no acute or suspicious osseous abnormalities. Degenerative change in scoliotic curvature in the spine.  IMPRESSION: 1. No acute abnormality in the abdomen/pelvis. 2. Diverticulosis without diverticulitis. No signs of bowel inflammation. 3. Multiple hypodense hepatic lesions. Largest in the hepatic dome measures 1.5 cm with questionable but not definitive subcapsular retraction. These lesions are indeterminate, and further characterization recommended with hepatic protocol MRI without and with contrast. 4. Multiple hypodense renal lesions, majority of represent simple cysts. Lesion arising from the lower left kidney contains internal septa, with question of fine calcification in the periphery. This could be a Bosniak 2 cyst, and no dedicated further follow-up is needed, however could also be assessed at the time of hepatic  lesion characterization with MRI. 5. Atherosclerosis.   Electronically Signed By: Jeb Levering M.D. On: 02/19/2015 01:16          DG Chest Portable 1 View (Final  result) Result time: 02/19/15 00:32:33   Final result by Rad Results In Interface (02/19/15 00:32:33)   Narrative:   CLINICAL DATA: Dyspnea. Hypoxia and cough.  EXAM: PORTABLE CHEST 1 VIEW  COMPARISON: 02/23/2013, report chest radiograph from 02/11/2015, images not available  FINDINGS: The lungs are hyperinflated, and this change. Heart at the upper limits of normal in size. No pulmonary edema, confluent airspace disease, pleural effusion or pneumothorax. No acute osseous abnormalities.  IMPRESSION: Hyperinflation with likely emphysema. No focal abnormality.   Electronically Signed By: Jeb Levering M.D. On: 02/19/2015 00:32          INITIAL IMPRESSION / ASSESSMENT AND PLAN / ED COURSE  Pertinent labs & imaging results that were available during my care of the patient were reviewed by me and considered in my medical decision making (see chart for details).  Patient with vomiting and diarrhea accompanied by generalized weakness and dizziness. Patient received IV normal saline 1 L as well as IV Zofran 4 mg. Patient's troponin noted to be positive as such patient discussed with the hospitalist staff for hospital admission for serial cardiac enzymes and further evaluation and management  ____________________________________________   FINAL CLINICAL IMPRESSION(S) / ED DIAGNOSES  Final diagnoses:  NSTEMI (non-ST elevated myocardial infarction) ([place])      Gregor Hams, MD 02/20/15 [phone]

## 2015-02-18 NOTE — ED Notes (Signed)
Pt reports vomiting and diarrhea today.  No abd pain.  Pt reports feeling weak and dizzy.  No chest pain or sob. Pt alert.  Speech clear.

## 2015-02-19 ENCOUNTER — Emergency Department: Payer: PPO

## 2015-02-19 ENCOUNTER — Encounter: Payer: Self-pay | Admitting: Radiology

## 2015-02-19 ENCOUNTER — Observation Stay: Payer: PPO

## 2015-02-19 DIAGNOSIS — N2889 Other specified disorders of kidney and ureter: Secondary | ICD-10-CM | POA: Diagnosis not present

## 2015-02-19 DIAGNOSIS — N281 Cyst of kidney, acquired: Secondary | ICD-10-CM | POA: Diagnosis not present

## 2015-02-19 DIAGNOSIS — R0789 Other chest pain: Secondary | ICD-10-CM | POA: Diagnosis present

## 2015-02-19 DIAGNOSIS — R7989 Other specified abnormal findings of blood chemistry: Secondary | ICD-10-CM | POA: Diagnosis not present

## 2015-02-19 DIAGNOSIS — K7689 Other specified diseases of liver: Secondary | ICD-10-CM | POA: Diagnosis not present

## 2015-02-19 DIAGNOSIS — R160 Hepatomegaly, not elsewhere classified: Secondary | ICD-10-CM | POA: Diagnosis not present

## 2015-02-19 DIAGNOSIS — R079 Chest pain, unspecified: Secondary | ICD-10-CM | POA: Diagnosis not present

## 2015-02-19 DIAGNOSIS — R112 Nausea with vomiting, unspecified: Secondary | ICD-10-CM | POA: Diagnosis not present

## 2015-02-19 DIAGNOSIS — R000 Tachycardia, unspecified: Secondary | ICD-10-CM | POA: Diagnosis not present

## 2015-02-19 DIAGNOSIS — K529 Noninfective gastroenteritis and colitis, unspecified: Secondary | ICD-10-CM | POA: Diagnosis not present

## 2015-02-19 LAB — URINALYSIS COMPLETE WITH MICROSCOPIC (ARMC ONLY)
BACTERIA UA: NONE SEEN
BILIRUBIN URINE: NEGATIVE
Glucose, UA: NEGATIVE mg/dL
LEUKOCYTES UA: NEGATIVE
Nitrite: NEGATIVE
PH: 5 (ref 5.0–8.0)
Protein, ur: 100 mg/dL — AB
SQUAMOUS EPITHELIAL / LPF: NONE SEEN
Specific Gravity, Urine: 1.01 (ref 1.005–1.030)

## 2015-02-19 LAB — HEMOGLOBIN A1C: Hgb A1c MFr Bld: 5.4 % (ref 4.0–6.0)

## 2015-02-19 LAB — FIBRIN DERIVATIVES D-DIMER (ARMC ONLY): FIBRIN DERIVATIVES D-DIMER (ARMC): 1748 — AB (ref 0–499)

## 2015-02-19 LAB — TROPONIN I
Troponin I: 0.05 ng/mL — ABNORMAL HIGH (ref <0.031)
Troponin I: 0.06 ng/mL — ABNORMAL HIGH (ref <0.031)
Troponin I: 0.05 ng/mL — ABNORMAL HIGH (ref <0.031)

## 2015-02-19 LAB — TSH: TSH: 0.745 u[IU]/mL (ref 0.350–4.500)

## 2015-02-19 MED ORDER — THIAMINE HCL 100 MG/ML IJ SOLN: 100.0000 mg | Freq: Every day | INTRAMUSCULAR | Status: DC

## 2015-02-19 MED ORDER — LORAZEPAM 2 MG PO TABS: 0.0000 mg | ORAL_TABLET | Freq: Four times a day (QID) | ORAL | Status: DC

## 2015-02-19 MED ORDER — MORPHINE SULFATE (PF) 2 MG/ML IV SOLN: 2.0000 mg | INTRAVENOUS | Status: DC | PRN

## 2015-02-19 MED ORDER — ASPIRIN EC 81 MG PO TBEC
81.0000 mg | DELAYED_RELEASE_TABLET | Freq: Every day | ORAL | Status: DC
  Administered 2015-02-19: 81 mg via ORAL
  Filled 2015-02-19: qty 1

## 2015-02-19 MED ORDER — ADULT MULTIVITAMIN W/MINERALS CH
1.0000 | ORAL_TABLET | Freq: Every day | ORAL | Status: DC
  Administered 2015-02-19 – 2015-02-20 (×2): 1 via ORAL
  Filled 2015-02-19 (×2): qty 1

## 2015-02-19 MED ORDER — POTASSIUM CHLORIDE IN NACL 40-0.9 MEQ/L-% IV SOLN
INTRAVENOUS | Status: DC
  Administered 2015-02-19: 125 mL/h via INTRAVENOUS
  Administered 2015-02-20: 50 mL/h via INTRAVENOUS
  Filled 2015-02-19 (×4): qty 1000

## 2015-02-19 MED ORDER — CLONIDINE HCL 0.1 MG PO TABS
0.1000 mg | ORAL_TABLET | Freq: Once | ORAL | Status: AC
  Administered 2015-02-19: 0.1 mg via ORAL

## 2015-02-19 MED ORDER — CLONIDINE HCL 0.1 MG PO TABS
ORAL_TABLET | ORAL | Status: AC
  Administered 2015-02-19: 0.1 mg via ORAL
  Filled 2015-02-19: qty 1

## 2015-02-19 MED ORDER — LORAZEPAM 2 MG/ML IJ SOLN
0.0000 mg | Freq: Four times a day (QID) | INTRAMUSCULAR | Status: DC
  Administered 2015-02-19: 2 mg via INTRAVENOUS
  Filled 2015-02-19: qty 2

## 2015-02-19 MED ORDER — GADOBENATE DIMEGLUMINE 529 MG/ML IV SOLN
20.0000 mL | Freq: Once | INTRAVENOUS | Status: AC | PRN
  Administered 2015-02-19: 17 mL via INTRAVENOUS

## 2015-02-19 MED ORDER — LORAZEPAM 2 MG PO TABS: 0.0000 mg | ORAL_TABLET | Freq: Two times a day (BID) | ORAL | Status: DC

## 2015-02-19 MED ORDER — ONDANSETRON HCL 4 MG/2ML IJ SOLN: 4.0000 mg | Freq: Four times a day (QID) | INTRAMUSCULAR | Status: DC | PRN

## 2015-02-19 MED ORDER — PANTOPRAZOLE SODIUM 40 MG PO TBEC
40.0000 mg | DELAYED_RELEASE_TABLET | Freq: Every day | ORAL | Status: DC
  Administered 2015-02-19 – 2015-02-20 (×2): 40 mg via ORAL
  Filled 2015-02-19 (×2): qty 1

## 2015-02-19 MED ORDER — IOHEXOL 350 MG/ML SOLN
100.0000 mL | Freq: Once | INTRAVENOUS | Status: AC | PRN
  Administered 2015-02-19: 100 mL via INTRAVENOUS

## 2015-02-19 MED ORDER — DOCUSATE SODIUM 100 MG PO CAPS
100.0000 mg | ORAL_CAPSULE | Freq: Two times a day (BID) | ORAL | Status: DC
  Administered 2015-02-19 (×2): 100 mg via ORAL
  Filled 2015-02-19 (×2): qty 1

## 2015-02-19 MED ORDER — ACETAMINOPHEN 650 MG RE SUPP: 650.0000 mg | Freq: Four times a day (QID) | RECTAL | Status: DC | PRN

## 2015-02-19 MED ORDER — VITAMIN B-1 100 MG PO TABS
100.0000 mg | ORAL_TABLET | Freq: Every day | ORAL | Status: DC
  Administered 2015-02-19 – 2015-02-20 (×2): 100 mg via ORAL
  Filled 2015-02-19 (×2): qty 1

## 2015-02-19 MED ORDER — ONDANSETRON HCL 4 MG PO TABS: 4.0000 mg | ORAL_TABLET | Freq: Four times a day (QID) | ORAL | Status: DC | PRN

## 2015-02-19 MED ORDER — HEPARIN SODIUM (PORCINE) 5000 UNIT/ML IJ SOLN
5000.0000 [IU] | Freq: Three times a day (TID) | INTRAMUSCULAR | Status: DC
  Administered 2015-02-19 – 2015-02-20 (×4): 5000 [IU] via SUBCUTANEOUS
  Filled 2015-02-19 (×4): qty 1

## 2015-02-19 MED ORDER — MAGNESIUM SULFATE 2 GM/50ML IV SOLN
2.0000 g | Freq: Once | INTRAVENOUS | Status: AC
  Administered 2015-02-19: 2 g via INTRAVENOUS
  Filled 2015-02-19: qty 50

## 2015-02-19 MED ORDER — ACETAMINOPHEN 325 MG PO TABS: 650.0000 mg | ORAL_TABLET | Freq: Four times a day (QID) | ORAL | Status: DC | PRN

## 2015-02-19 MED ORDER — LORAZEPAM 2 MG/ML IJ SOLN: 0.0000 mg | Freq: Two times a day (BID) | INTRAMUSCULAR | Status: DC

## 2015-02-19 MED ORDER — SODIUM CHLORIDE 0.9 % IJ SOLN
3.0000 mL | Freq: Two times a day (BID) | INTRAMUSCULAR | Status: DC
  Administered 2015-02-19 – 2015-02-20 (×3): 3 mL via INTRAVENOUS

## 2015-02-19 NOTE — Progress Notes (Signed)
I called Echo tech and state they will try to get ECHO done today, but if not, will get it done tomorrow.

## 2015-02-19 NOTE — ED Notes (Signed)
Family left phone numbers to be put into chart. Patient states both his son and daughter-in-law are allowed to be made away of his status and plan of care. They states they can be called at any time if needed.  Joen Millet (son): [phone] Merrick Bemus (daughter-in-law): [phone]

## 2015-02-19 NOTE — Progress Notes (Signed)
Dr Leslye Peer notified of troponin 0.06, and states he is not concerned. Continue to monitor. ECHO has already been ordered.

## 2015-02-19 NOTE — H&P (Addendum)
Daniel Edwards is an 81 y.o. male.   Chief Complaint: Diarrhea HPI: The patient presents emergency department originally complaining of nausea, vomiting and diarrhea. He states that his main complaint is dizziness and weakness that was present multiple days prior to the onset of dizziness and nausea. He denies lightheadedness. The patient admits that his diarrhea began today and is nonbloody. He denies sick contacts. Emesis is also nonbloody and nonbilious. In the emergency department was found to have an elevated troponin though he denied any chest pain. He is vague regarding the onset of discomfort in his chest but admits that it is sometimes related to acid reflux. Due to his persistent tachycardia as well as elevated troponin in the emergency department staff called for admission.  Past Medical History  Diagnosis Date  . Essential hypertension   . GERD (gastroesophageal reflux disease)     Past Surgical History  Procedure Laterality Date  . Inguinal hernia repair      No family history on file. None Social History:  reports that he has been smoking.  He does not have any smokeless tobacco history on file. He reports that he drinks alcohol. His drug history is not on file.  Allergies: No Known Allergies  Prior to Admission medications   Medication Sig Start Date End Date Taking? Authorizing Provider  aspirin EC 81 MG tablet Take 81 mg by mouth daily.   Yes Historical Provider, MD  Multiple Vitamins-Minerals (MULTIVITAMIN WITH MINERALS) tablet Take 1 tablet by mouth daily.   Yes Historical Provider, MD  omeprazole (PRILOSEC) 20 MG capsule Take 20 mg by mouth daily.   Yes Historical Provider, MD     Results for orders placed or performed during the hospital encounter of 02/18/15 (from the past 48 hour(s))  CBC WITH DIFFERENTIAL     Status: Abnormal   Collection Time: 02/18/15  8:59 PM  Result Value Ref Range   WBC 13.8 (H) 3.8 - 10.6 K/uL   RBC 4.73 4.40 - 5.90 MIL/uL   Hemoglobin  13.7 13.0 - 18.0 g/dL   HCT 41.6 40.0 - 52.0 %   MCV 87.9 80.0 - 100.0 fL   MCH 29.0 26.0 - 34.0 pg   MCHC 32.9 32.0 - 36.0 g/dL   RDW 13.5 11.5 - 14.5 %   Platelets 249 150 - 440 K/uL   Neutrophils Relative % 92 %   Neutro Abs 12.7 (H) 1.4 - 6.5 K/uL   Lymphocytes Relative 4 %   Lymphs Abs 0.5 (L) 1.0 - 3.6 K/uL   Monocytes Relative 3 %   Monocytes Absolute 0.5 0.2 - 1.0 K/uL   Eosinophils Relative 0 %   Eosinophils Absolute 0.0 0 - 0.7 K/uL   Basophils Relative 1 %   Basophils Absolute 0.1 0 - 0.1 K/uL  Comprehensive metabolic panel     Status: Abnormal   Collection Time: 02/18/15  8:59 PM  Result Value Ref Range   Sodium 131 (L) 135 - 145 mmol/L   Potassium 3.0 (L) 3.5 - 5.1 mmol/L   Chloride 93 (L) 101 - 111 mmol/L   CO2 24 22 - 32 mmol/L   Glucose, Bld 163 (H) 65 - 99 mg/dL   BUN 8 6 - 20 mg/dL   Creatinine, Ser 0.82 0.61 - 1.24 mg/dL   Calcium 7.2 (L) 8.9 - 10.3 mg/dL   Total Protein 7.7 6.5 - 8.1 g/dL   Albumin 4.0 3.5 - 5.0 g/dL   AST 25 15 - 41 U/L  ALT 16 (L) 17 - 63 U/L   Alkaline Phosphatase 59 38 - 126 U/L   Total Bilirubin 0.8 0.3 - 1.2 mg/dL   GFR calc non Af Amer >60 >60 mL/min   GFR calc Af Amer >60 >60 mL/min    Comment: (NOTE) The eGFR has been calculated using the CKD EPI equation. This calculation has not been validated in all clinical situations. eGFR's persistently <60 mL/min signify possible Chronic Kidney Disease.    Anion gap 14 5 - 15  Lipase, blood     Status: None   Collection Time: 02/18/15  8:59 PM  Result Value Ref Range   Lipase 17 11 - 51 U/L  Troponin I     Status: Abnormal   Collection Time: 02/18/15  8:59 PM  Result Value Ref Range   Troponin I 0.04 (H) <0.031 ng/mL    Comment: READ BACK AND VERIFIED WITH EMMA HUNTER ON 02/18/15 AT [address]        PERSISTENTLY INCREASED TROPONIN VALUES IN THE RANGE OF 0.04-0.49 ng/mL CAN BE SEEN IN:       -UNSTABLE ANGINA       -CONGESTIVE HEART FAILURE       -MYOCARDITIS       -CHEST  TRAUMA       -ARRYHTHMIAS       -LATE PRESENTING MYOCARDIAL INFARCTION       -COPD   CLINICAL FOLLOW-UP RECOMMENDED.   Fibrin derivatives D-Dimer     Status: Abnormal   Collection Time: 02/18/15  8:59 PM  Result Value Ref Range   Fibrin derivatives D-dimer ([facility]) 1748 (H) 0 - 499    Comment: <> Exclusion of Venous Thromboembolism (VTE) - OUTPATIENTS ONLY        (Emergency Department or [place])             0-499 ng/ml (FEU)  : With a low to intermediate pretest                                        probability for VTE this test result                                        excludes the diagnosis of VTE.           > 499 ng/ml (FEU)  : VTE not excluded.  Additional work up                                   for VTE is required.   <>  Testing on Inpatients and Evaluation of Disseminated Intravascular        Coagulation ([facility])             Reference Range:   0-499 ng/ml (FEU)   Urinalysis complete, with microscopic ([facility] only)     Status: Abnormal   Collection Time: 02/19/15 12:08 AM  Result Value Ref Range   Color, Urine YELLOW (A) YELLOW   APPearance CLEAR (A) CLEAR   Glucose, UA NEGATIVE NEGATIVE mg/dL   Bilirubin Urine NEGATIVE NEGATIVE   Ketones, ur 1+ (A) NEGATIVE mg/dL   Specific Gravity, Urine 1.010 1.005 - 1.030   Hgb urine dipstick 1+ (A) NEGATIVE   pH 5.0 5.0 -  8.0   Protein, ur 100 (A) NEGATIVE mg/dL   Nitrite NEGATIVE NEGATIVE   Leukocytes, UA NEGATIVE NEGATIVE   RBC / HPF 0-5 0 - 5 RBC/hpf   WBC, UA 0-5 0 - 5 WBC/hpf   Bacteria, UA NONE SEEN NONE SEEN   Squamous Epithelial / LPF NONE SEEN NONE SEEN   Mucous PRESENT    Ct Abdomen Pelvis W Contrast  02/19/2015  CLINICAL DATA:  Vomiting and diarrhea, onset yesterday. Weakness and dizziness. EXAM: CT ABDOMEN AND PELVIS WITH CONTRAST TECHNIQUE: Multidetector CT imaging of the abdomen and pelvis was performed using the standard protocol following bolus administration of intravenous contrast. CONTRAST:  11m OMNIPAQUE  IOHEXOL 350 MG/ML SOLN COMPARISON:  None. FINDINGS: Lower chest:  The included lung bases are clear. Liver: There are multiple hypodense liver lesions scattered throughout the right and left lobe. Largest lesion in the dome measures 1.5 cm, with slightly irregular margins. There is question of capsular retraction on the coronal reformat. There is a 1.3 cm lesion in the subcapsular right lobe. Additional smaller lesions are seen scattered throughout both lobes. These are incompletely characterized. Hepatobiliary: Gallbladder physiologically distended, no calcified stone. No biliary dilatation. Pancreas: Atrophic pancreas without ductal dilatation or inflammation. Spleen: Normal. Adrenal glands: No nodule.  Mild thickening bilaterally. Kidneys: Symmetric renal enhancement. No hydronephrosis. Multiple cystic lesions within both kidneys. Arising from the lower left kidney is a 4.1 cm cyst with septation versus 2 adjacent cysts. Questionable peripheral calcification about the inferior aspect. Exophytic cystic lesion arises from the upper left kidney measuring 3 cm. All of the cysts measure simple fluid density. Stomach/Bowel: Stomach distended with enteric contrast There are no dilated or thickened small bowel loops. Oral contrast reaches to the distal small bowel moderate volume of stool in the proximal colon, small volume of stool with distal colon. There is distal colonic diverticulosis without diverticulitis. The appendix is not seen, no pericecal or right lower quadrant inflammatory change. Vascular/Lymphatic: No retroperitoneal adenopathy. Abdominal aorta is normal in caliber. Dense atherosclerosis of the abdominal aorta without aneurysm. Reproductive: Heterogeneous prostate gland, prominent size. Bladder: No wall thickening. Probable left lateral bladder diverticulum. Other: No free air, free fluid, or intra-abdominal fluid collection. Musculoskeletal: There are no acute or suspicious osseous abnormalities.  Degenerative change in scoliotic curvature in the spine. IMPRESSION: 1. No acute abnormality in the abdomen/pelvis. 2. Diverticulosis without diverticulitis. No signs of bowel inflammation. 3. Multiple hypodense hepatic lesions. Largest in the hepatic dome measures 1.5 cm with questionable but not definitive subcapsular retraction. These lesions are indeterminate, and further characterization recommended with hepatic protocol MRI without and with contrast. 4. Multiple hypodense renal lesions, majority of represent simple cysts. Lesion arising from the lower left kidney contains internal septa, with question of fine calcification in the periphery. This could be a Bosniak 2 cyst, and no dedicated further follow-up is needed, however could also be assessed at the time of hepatic lesion characterization with MRI. 5. Atherosclerosis. Electronically Signed   By: MJeb LeveringM.D.   On: 02/19/2015 01:16   Dg Chest Portable 1 View  02/19/2015  CLINICAL DATA:  Dyspnea.  Hypoxia and cough. EXAM: PORTABLE CHEST 1 VIEW COMPARISON:  02/23/2013, report chest radiograph from 02/11/2015, images not available FINDINGS: The lungs are hyperinflated, and this change. Heart at the upper limits of normal in size. No pulmonary edema, confluent airspace disease, pleural effusion or pneumothorax. No acute osseous abnormalities. IMPRESSION: Hyperinflation with likely emphysema.  No focal abnormality. Electronically Signed   By:  Jeb Levering M.D.   On: 02/19/2015 00:32    Review of Systems  Constitutional: Negative for fever and chills.  HENT: Negative for sore throat and tinnitus.   Eyes: Negative for blurred vision and redness.  Respiratory: Negative for cough and shortness of breath.   Cardiovascular: Positive for chest pain. Negative for palpitations, orthopnea and PND.  Gastrointestinal: Positive for nausea and diarrhea. Negative for vomiting and abdominal pain.  Genitourinary: Negative for dysuria, urgency and  frequency.  Musculoskeletal: Negative for myalgias and joint pain.  Skin: Negative for rash.       No lesions  Neurological: Positive for dizziness. Negative for speech change, focal weakness and weakness.  Endo/Heme/Allergies: Does not bruise/bleed easily.       No temperature intolerance  Psychiatric/Behavioral: Negative for depression and suicidal ideas.    Blood pressure 141/72, pulse 90, temperature 98.5 F (36.9 C), temperature source Oral, resp. rate 14, height 5' 10"  (1.778 m), weight 84.369 kg (186 lb), SpO2 98 %. Physical Exam  Constitutional: He is oriented to person, place, and time. He appears well-developed and well-nourished. No distress.  Tremulous  HENT:  Head: Normocephalic and atraumatic.  Mouth/Throat: Oropharynx is clear and moist.  Eyes: Conjunctivae and EOM are normal. Pupils are equal, round, and reactive to light. No scleral icterus.  Neck: Normal range of motion. Neck supple. No JVD present. No tracheal deviation present. No thyromegaly present.  Cardiovascular: Normal heart sounds.  Tachycardia present.  Exam reveals no gallop and no friction rub.   No murmur heard. Respiratory: Effort normal and breath sounds normal. No respiratory distress.  GI: Soft. Bowel sounds are normal. He exhibits distension. He exhibits no mass. There is no tenderness. There is no rebound and no guarding.  Genitourinary:  Deferred  Musculoskeletal: Normal range of motion. He exhibits no edema.  Lymphadenopathy:    He has no cervical adenopathy.  Neurological: He is alert and oriented to person, place, and time. No cranial nerve deficit.  Skin: Skin is warm. No rash noted. He is diaphoretic. No erythema.  Psychiatric: He has a normal mood and affect. His behavior is normal. Judgment and thought content normal.     Assessment/Plan This is a 81 year old Caucasian male admitted for atypical chest pain as well as tachycardia, vomiting and diarrhea. 1. Chest pain: Atypical;  apparently the patient has intermittent chest pain even when he is well. He denies associated shortness of breath with this admission. It seems that his chest pain is related to vomiting which supports the potential etiology of esophageal reflux. We will continue to follow the patient's cardiac enzymes and monitor telemetry. EKG does not show any indication of myocardial ischemia at this time. Cardiology consultation at the discretion of the primary team. 2. Tachycardia: Differential diagnosis includes dehydration, pain, pulmonary embolism, controlled hypertension and/or alcohol withdrawal. Notably, the patient has an elevated d-dimer. His pretest probability of pulmonary embolism was very low. Also, his tachycardia has improved significantly with fluid resuscitation. Thus, we will not obtain a contrast study of his chest at this time. 3. Gastroenteritis: Likely viral; place droplet and contact precautions. CT of the abdomen shows diverticulosis without inflammation. Notably the patient also has hypodense lesions within his liver and kidneys. Radiology recommends follow-up with MRI. 4. Essential hypertension: Initially uncontrolled but much improved at this time. We'll continue to monitor and assess for optimal blood pressure control. Will use as needed medication for now. 5. Alcohol abuse: The patient reports that he drinks at least 3 beers  per day. He denies stopping drinking although his diaphoresis and shakiness indicates that he may have cut back on his drinking significantly. I have placed him on a CIWA scale. Ativan as needed. 6. DVT prophylaxis: Lovenox 7. GI prophylaxis: H2 blocker for gastroesophageal reflux disease The patient is a full code. Time spent on admission orders and patient care approximately 45 minutes   Harrie Foreman 02/19/2015, 4:27 AM

## 2015-02-19 NOTE — ED Notes (Signed)
Patient transported to CT 

## 2015-02-19 NOTE — Progress Notes (Signed)
Patient ID: Daniel Edwards, male   DOB: 1934/09/20, 81 y.o.   MRN: [id number] [facility] PROGRESS NOTE  PCP: Madelyn Brunner, MD  HPI/Subjective: Patient feeling better now. Had diarrhea, nausea and vomiting. and possibly chest pain when he came in. Patient has poor appetite now. Potassium was found to be low in the ER. Patient had a borderline troponin in the ER.  Objective: Filed Vitals:   02/19/15 0648 02/19/15 0738  BP: 123/66 136/68  Pulse: 81 92  Temp:  97.6 F (36.4 C)  Resp:  18    Filed Weights   02/18/15 2046  Weight: 84.369 kg (186 lb)    ROS: Review of Systems  Constitutional: Negative for fever and chills.  Eyes: Negative for blurred vision.  Respiratory: Negative for cough and shortness of breath.   Cardiovascular: Negative for chest pain.  Gastrointestinal: Positive for nausea, vomiting and diarrhea. Negative for constipation.  Genitourinary: Negative for dysuria.  Musculoskeletal: Negative for joint pain.  Neurological: Negative for dizziness and headaches.   Exam: Physical Exam  Constitutional: He is oriented to person, place, and time.  HENT:  Nose: No mucosal edema.  Mouth/Throat: No oropharyngeal exudate or posterior oropharyngeal edema.  Eyes: Conjunctivae, EOM and lids are normal. Pupils are equal, round, and reactive to light.  Neck: No JVD present. Carotid bruit is not present. No edema present. No thyroid mass and no thyromegaly present.  Cardiovascular: S1 normal and S2 normal.  Exam reveals no gallop.   Murmur heard.  Systolic murmur is present with a grade of 2/6  Pulses:      Dorsalis pedis pulses are 2+ on the right side, and 2+ on the left side.  Respiratory: No respiratory distress. He has no wheezes. He has no rhonchi. He has no rales.  GI: Soft. Bowel sounds are normal. There is no tenderness.  Musculoskeletal:       Right ankle: He exhibits no swelling.       Left ankle: He exhibits no swelling.  Lymphadenopathy:     He has no cervical adenopathy.  Neurological: He is alert and oriented to person, place, and time. No cranial nerve deficit.  Skin: Skin is warm. No rash noted. Nails show no clubbing.  Psychiatric: He has a normal mood and affect.    Data Reviewed: Basic Metabolic Panel:  Recent Labs Lab 02/18/15 2059  NA 131*  K 3.0*  CL 93*  CO2 24  GLUCOSE 163*  BUN 8  CREATININE 0.82  CALCIUM 7.2*   Liver Function Tests:  Recent Labs Lab 02/18/15 2059  AST 25  ALT 16*  ALKPHOS 59  BILITOT 0.8  PROT 7.7  ALBUMIN 4.0    Recent Labs Lab 02/18/15 2059  LIPASE 17   CBC:  Recent Labs Lab 02/18/15 2059  WBC 13.8*  NEUTROABS 12.7*  HGB 13.7  HCT 41.6  MCV 87.9  PLT 249   Cardiac Enzymes:  Recent Labs Lab 02/18/15 2059 02/19/15 0913 02/19/15 1329  TROPONINI 0.04* 0.05* 0.06*     Studies: Ct Abdomen Pelvis W Contrast  02/19/2015  CLINICAL DATA:  Vomiting and diarrhea, onset yesterday. Weakness and dizziness. EXAM: CT ABDOMEN AND PELVIS WITH CONTRAST TECHNIQUE: Multidetector CT imaging of the abdomen and pelvis was performed using the standard protocol following bolus administration of intravenous contrast. CONTRAST:  134mL OMNIPAQUE IOHEXOL 350 MG/ML SOLN COMPARISON:  None. FINDINGS: Lower chest:  The included lung bases are clear. Liver: There are multiple hypodense liver lesions scattered  throughout the right and left lobe. Largest lesion in the dome measures 1.5 cm, with slightly irregular margins. There is question of capsular retraction on the coronal reformat. There is a 1.3 cm lesion in the subcapsular right lobe. Additional smaller lesions are seen scattered throughout both lobes. These are incompletely characterized. Hepatobiliary: Gallbladder physiologically distended, no calcified stone. No biliary dilatation. Pancreas: Atrophic pancreas without ductal dilatation or inflammation. Spleen: Normal. Adrenal glands: No nodule.  Mild thickening bilaterally. Kidneys:  Symmetric renal enhancement. No hydronephrosis. Multiple cystic lesions within both kidneys. Arising from the lower left kidney is a 4.1 cm cyst with septation versus 2 adjacent cysts. Questionable peripheral calcification about the inferior aspect. Exophytic cystic lesion arises from the upper left kidney measuring 3 cm. All of the cysts measure simple fluid density. Stomach/Bowel: Stomach distended with enteric contrast There are no dilated or thickened small bowel loops. Oral contrast reaches to the distal small bowel moderate volume of stool in the proximal colon, small volume of stool with distal colon. There is distal colonic diverticulosis without diverticulitis. The appendix is not seen, no pericecal or right lower quadrant inflammatory change. Vascular/Lymphatic: No retroperitoneal adenopathy. Abdominal aorta is normal in caliber. Dense atherosclerosis of the abdominal aorta without aneurysm. Reproductive: Heterogeneous prostate gland, prominent size. Bladder: No wall thickening. Probable left lateral bladder diverticulum. Other: No free air, free fluid, or intra-abdominal fluid collection. Musculoskeletal: There are no acute or suspicious osseous abnormalities. Degenerative change in scoliotic curvature in the spine. IMPRESSION: 1. No acute abnormality in the abdomen/pelvis. 2. Diverticulosis without diverticulitis. No signs of bowel inflammation. 3. Multiple hypodense hepatic lesions. Largest in the hepatic dome measures 1.5 cm with questionable but not definitive subcapsular retraction. These lesions are indeterminate, and further characterization recommended with hepatic protocol MRI without and with contrast. 4. Multiple hypodense renal lesions, majority of represent simple cysts. Lesion arising from the lower left kidney contains internal septa, with question of fine calcification in the periphery. This could be a Bosniak 2 cyst, and no dedicated further follow-up is needed, however could also be  assessed at the time of hepatic lesion characterization with MRI. 5. Atherosclerosis. Electronically Signed   By: Jeb Levering M.D.   On: 02/19/2015 01:16   Dg Chest Portable 1 View  02/19/2015  CLINICAL DATA:  Dyspnea.  Hypoxia and cough. EXAM: PORTABLE CHEST 1 VIEW COMPARISON:  02/23/2013, report chest radiograph from 02/11/2015, images not available FINDINGS: The lungs are hyperinflated, and this change. Heart at the upper limits of normal in size. No pulmonary edema, confluent airspace disease, pleural effusion or pneumothorax. No acute osseous abnormalities. IMPRESSION: Hyperinflation with likely emphysema.  No focal abnormality. Electronically Signed   By: Jeb Levering M.D.   On: 02/19/2015 00:32    Scheduled Meds: . aspirin EC  81 mg Oral Daily  . docusate sodium  100 mg Oral BID  . heparin  5,000 Units Subcutaneous 3 times per day  . LORazepam  0-4 mg Intravenous 4 times per day  . LORazepam  0-4 mg Intravenous Q12H  . LORazepam  0-4 mg Oral 4 times per day  . LORazepam  0-4 mg Oral Q12H  . multivitamin with minerals  1 tablet Oral Daily  . pantoprazole  40 mg Oral Daily  . potassium chloride  40 mEq Oral BID  . sodium chloride  3 mL Intravenous Q12H  . thiamine  100 mg Intravenous Daily  . thiamine  100 mg Oral Daily   Continuous Infusions: . 0.9 % NaCl  with KCl 40 mEq / L 50 mL/hr (02/19/15 1213)    Assessment/Plan:  1. Nausea, vomiting, diarrhea. Could be a viral gastroenteritis. Decrease IV fluid hydration 2. Elevated troponin- patient denies chest pain to me and no shortness of breath. I will obtain an echocardiogram. No other workup. Aspirin only. 3. Liver lesions and kidney lesions will get an MRI of the abdomen tomorrow morning 4. Weakness will get physical therapy evaluation 5. Hypokalemia- I will replace potassium in IV fluids and orally. Replace magnesium also and check a magnesium level tomorrow morning. 6. Hyponatremia likely dehydration. Patient also  drinks alcohol which could be contributing  Code Status:     Code Status Orders        Start     Ordered   02/19/15 0725  Full code   Continuous     02/19/15 0724    Code Status History    Date Active Date Inactive Code Status Order ID Comments User Context   This patient has a current code status but no historical code status.    Advance Directive Documentation        Most Recent Value   Type of Advance Directive  Living will   Pre-existing out of facility DNR order (yellow form or pink MOST form)     "MOST" Form in Place?       Family Communication: Family at bedside Disposition Plan: Potentially home tomorrow  Time spent: 35 minutes  Loletha Grayer  [facility] [place] [facility]

## 2015-02-19 NOTE — Care Management Obs Status (Signed)
[place] NOTIFICATION   Patient Details  Name: Daniel Edwards MRN: [id number] Date of Birth: 07-Feb-1935   Medicare Observation Status Notification Given:  Yes    Marshell Garfinkel, RN 02/19/2015, 12:02 PM

## 2015-02-19 NOTE — Care Management (Signed)
Spoke with patient's son Leory Plowman over the phone. He states patient is independent at home with a walker. Leory Plowman lives across the road from patient. He states patient still drives. He does not anticipate any discharge needs.

## 2015-02-19 NOTE — ED Notes (Signed)
Patients daughter-in-law notified this RN that patient is an every day drinker. She states he may get agitated and demanding later on during admission. MD made aware. MD states to use the CIWA scale.

## 2015-02-19 NOTE — Evaluation (Signed)
Physical Therapy Evaluation Patient Details Name: Daniel Edwards MRN: [id number] DOB: [birth year] Today's Date: 02/19/2015   History of Present Illness  81 yo male with onset of N & V, diarrhea and weakness/dizziness for a week.  He is tachycardic and has atrophic pancreas with history of emphysema.  Checked out for chest pain and has no active cardiovascular condition acutely.  Clinical Impression  Pt was able to get up to walk with help but is not safe alone.  Despite his admission status, he is not safe to be home alone.  PT eval was done to create a picture of his safety and needs, and will expect him to need 24/7 assistance to avoid a major fall.    Follow Up Recommendations SNF    Equipment Recommendations  Rolling walker with 5" wheels    Recommendations for Other Services       Precautions / Restrictions Precautions Precautions: Fall (telemetry) Restrictions Weight Bearing Restrictions: No      Mobility  Bed Mobility Overal bed mobility: Needs Assistance Bed Mobility: Supine to Sit;Sit to Supine     Supine to sit: Min assist Sit to supine: Min assist   General bed mobility comments: needs help to scoot forward on bed and help to roll  Transfers Overall transfer level: Needs assistance Equipment used: Rolling walker (2 wheeled);1 person hand held assist Transfers: Sit to/from [facility] Sit to Stand: Min assist Stand pivot transfers: Min guard;Min assist       General transfer comment: reminders for pt to place hands to assist stand up and transition to walker  Ambulation/Gait Ambulation/Gait assistance: Min assist Ambulation Distance (Feet): 30 Feet Assistive device: Rolling walker (2 wheeled);1 person hand held assist Gait Pattern/deviations: Step-through pattern;Step-to pattern;Narrow base of support;Trunk flexed;Shuffle;Decreased dorsiflexion - left;Decreased dorsiflexion - right Gait velocity: reduced Gait velocity interpretation:  Below normal speed for age/gender General Gait Details: Pt is weak and cannot safely control walker alone, turns with min assist   Stairs            Wheelchair Mobility    Modified Rankin (Stroke Patients Only)       Balance Overall balance assessment: Needs assistance Sitting-balance support: Feet supported Sitting balance-Leahy Scale: Fair     Standing balance support: Bilateral upper extremity supported Standing balance-Leahy Scale: Poor                               Pertinent Vitals/Pain Pain Assessment: No/denies pain    Home Living Family/patient expects to be discharged to:: Private residence Living Arrangements: Alone   Type of Home: House Home Access: Level entry     Home Layout: One level Home Equipment: Environmental consultant - 2 wheels;Other (comment) (has no other equipment and not sure where walker is)      Prior Function Level of Independence: Independent               Hand Dominance        Extremity/Trunk Assessment   Upper Extremity Assessment: Overall WFL for tasks assessed           Lower Extremity Assessment: Generalized weakness      Cervical / Trunk Assessment: Normal  Communication   Communication: No difficulties  Cognition Arousal/Alertness: Awake/alert Behavior During Therapy: WFL for tasks assessed/performed Overall Cognitive Status: Within Functional Limits for tasks assessed  General Comments General comments (skin integrity, edema, etc.): Pt is not necessarily confused but does have some lack of awareness of his poor safety with gait.  Expect him to go to SNF but may not agree immediately    Exercises        Assessment/Plan    PT Assessment Patient needs continued PT services  PT Diagnosis Generalized weakness   PT Problem List Decreased strength;Decreased range of motion;Decreased activity tolerance;Decreased balance;Decreased mobility;Decreased coordination;Decreased  knowledge of use of DME;Decreased safety awareness;Decreased knowledge of precautions;Cardiopulmonary status limiting activity;Decreased skin integrity  PT Treatment Interventions DME instruction;Gait training;Functional mobility training;Therapeutic activities;Stair training;Therapeutic exercise;Balance training;Neuromuscular re-education;Patient/family education   PT Goals (Current goals can be found in the Care Plan section) Acute Rehab PT Goals Patient Stated Goal: to get home PT Goal Formulation: With patient Time For Goal Achievement: 03/05/15 Potential to Achieve Goals: Good    Frequency Min 2X/week   Barriers to discharge Inaccessible home environment;Decreased caregiver support home alone and cannot walk alone    Co-evaluation               End of Session Equipment Utilized During Treatment: Gait belt Activity Tolerance: Patient tolerated treatment well;Patient limited by fatigue Patient left: in bed;with call bell/phone within reach;with bed alarm set;Other (comment) (Spoke to nursing as pt is wet, needs bath and change ) Nurse Communication: Mobility status;Other (comment) (Pt is in need of bath)    Functional Assessment Tool Used: clinical judgment Functional Limitation: Mobility: Walking and moving around Mobility: Walking and Moving Around Current Status [phone]): At least 20 percent but less than 40 percent impaired, limited or restricted Mobility: Walking and Moving Around Goal Status [phone]): At least 20 percent but less than 40 percent impaired, limited or restricted    Time: 1741-1805 PT Time Calculation (min) (ACUTE ONLY): 24 min   Charges:   PT Evaluation $PT Eval Moderate Complexity: 1 Procedure PT Treatments $Gait Training: 8-22 mins   PT G Codes:   PT G-Codes **NOT FOR INPATIENT CLASS** Functional Assessment Tool Used: clinical judgment Functional Limitation: Mobility: Walking and moving around Mobility: Walking and Moving Around Current Status  [id number]): At least 20 percent but less than 40 percent impaired, limited or restricted Mobility: Walking and Moving Around Goal Status [phone]): At least 20 percent but less than 40 percent impaired, limited or restricted    Ramond Dial 02/19/2015, 6:31 PM   Mee Hives, PT MS Acute Rehab Dept. Number: [facility] [id number] and [place] [phone]

## 2015-02-19 NOTE — ED Notes (Signed)
Patient placed on 2L [place] due to SpO2 91%. Patient up to 95% on 2L.

## 2015-02-20 ENCOUNTER — Observation Stay
Admit: 2015-02-20 | Discharge: 2015-02-20 | Disposition: A | Discharge status: Home / Self Care | Payer: PPO | Attending: Internal Medicine | Admitting: Internal Medicine

## 2015-02-20 DIAGNOSIS — R7989 Other specified abnormal findings of blood chemistry: Secondary | ICD-10-CM | POA: Diagnosis not present

## 2015-02-20 DIAGNOSIS — K529 Noninfective gastroenteritis and colitis, unspecified: Secondary | ICD-10-CM | POA: Diagnosis not present

## 2015-02-20 DIAGNOSIS — R079 Chest pain, unspecified: Secondary | ICD-10-CM | POA: Diagnosis not present

## 2015-02-20 LAB — BASIC METABOLIC PANEL
Anion gap: 9 (ref 5–15)
BUN: 6 mg/dL (ref 6–20)
CHLORIDE: 104 mmol/L (ref 101–111)
CO2: 24 mmol/L (ref 22–32)
CREATININE: 0.88 mg/dL (ref 0.61–1.24)
Calcium: 6.9 mg/dL — ABNORMAL LOW (ref 8.9–10.3)
GFR calc Af Amer: >60 mL/min (ref >60)
GFR calc non Af Amer: >60 mL/min (ref >60)
Glucose, Bld: 91 mg/dL (ref 65–99)
POTASSIUM: 3.9 mmol/L (ref 3.5–5.1)
Sodium: 137 mmol/L (ref 135–145)

## 2015-02-20 LAB — MAGNESIUM
Magnesium: 0.9 mg/dL — CL (ref 1.7–2.4)
Magnesium: 2.5 mg/dL — ABNORMAL HIGH (ref 1.7–2.4)

## 2015-02-20 MED ORDER — MAGNESIUM SULFATE 4 GM/100ML IV SOLN
4.0000 g | Freq: Once | INTRAVENOUS | Status: AC
  Administered 2015-02-20: 4 g via INTRAVENOUS
  Filled 2015-02-20: qty 100

## 2015-02-20 MED ORDER — AMLODIPINE BESYLATE 5 MG PO TABS
5.0000 mg | ORAL_TABLET | Freq: Every day | ORAL | Status: DC
Start: 2015-02-20 — End: 2015-02-20
  Administered 2015-02-20: 5 mg via ORAL
  Filled 2015-02-20: qty 1

## 2015-02-20 MED ORDER — MAGNESIUM OXIDE 400 (241.3 MG) MG PO TABS
400.0000 mg | ORAL_TABLET | Freq: Two times a day (BID) | ORAL | Status: DC
  Administered 2015-02-20: 400 mg via ORAL
  Filled 2015-02-20: qty 1

## 2015-02-20 MED ORDER — AMLODIPINE BESYLATE 5 MG PO TABS: 5.0000 mg | ORAL_TABLET | Freq: Every day | ORAL | Status: DC

## 2015-02-20 MED ORDER — MAGNESIUM OXIDE 400 (241.3 MG) MG PO TABS: 400.0000 mg | ORAL_TABLET | Freq: Every day | ORAL | Status: DC

## 2015-02-20 NOTE — Care Management (Signed)
Patient refused HHPT and SNF per Dr. Leslye Peer. No further RNCM needs. Case closed.

## 2015-02-20 NOTE — Discharge Instructions (Signed)
Follow up with your medical doctor.  If any further GI symptoms can consider EGD.

## 2015-02-20 NOTE — Progress Notes (Signed)
CSW consult for possible SNF Placement.  Patient is refusing SNF placement at this time.  Care manager has talked with patient about home health needs.  Daniel Edwards. [facility] [phone] 12:52 PM

## 2015-02-20 NOTE — Progress Notes (Signed)
*  PRELIMINARY RESULTS* Echocardiogram 2D Echocardiogram has been performed.  Daniel Edwards 02/20/2015, 9:18 AM

## 2015-02-20 NOTE — Discharge Summary (Signed)
[facility] [place] NAME: Daniel Edwards    MR#:  [id number]  DATE OF BIRTH:  12/08/1934  DATE OF ADMISSION:  02/18/2015 ADMITTING PHYSICIAN: Harrie Foreman, MD  DATE OF DISCHARGE: 02/20/2015  2:57 PM  PRIMARY CARE PHYSICIAN: Madelyn Brunner, MD    ADMISSION DIAGNOSIS:  Gastroenteritis  DISCHARGE DIAGNOSIS:  Active Problems:   Chest pain   Atypical chest pain   SECONDARY DIAGNOSIS:   Past Medical History  Diagnosis Date  . Essential hypertension   . GERD (gastroesophageal reflux disease)     HOSPITAL COURSE:   1. Patient was admitted with a viral gastroenteritis with nausea vomiting abdominal pain and diarrhea. The patient had no further diarrhea upon presentation to the hospital. Patient takes PPI at home 2. Elevated troponin this is demand ischemia from the nausea vomiting diarrhea viral gastroenteritis. 3. Abnormal CT scan showing liver lesions and kidney lesions. An MRI of the abdomen showed a hemangioma in the liver and benign liver cyst. Also showed benign kidney cyst. It also showed some thickening of the wall of the stomach and a endoscopy can be done for clinical correlation. Patient refused endoscopy during the hospital course 4. Weakness physical therapy recommended rehabilitation patient absolutely refused. He also refused home health. This was discussed with the son. 5. Hypokalemia replace potassium in the IV fluids and orally. 6. Severe hypomagnesemia- I had a give 6 g of IV magnesium to get his magnesium in the normal range. Oral supplementation upon discharge. He was advised to stop drinking alcohol 7. Hyponatremia and dehydration- from the gastroenteritis. 8. Essential hypertension- Norvasc prescribed  DISCHARGE CONDITIONS:   Fair  CONSULTS OBTAINED:  None  DRUG ALLERGIES:  No Known Allergies  DISCHARGE MEDICATIONS:   Discharge Medication List as of 02/20/2015  1:49 PM    START taking these  medications   Details  amLODipine (NORVASC) 5 MG tablet Take 1 tablet (5 mg total) by mouth daily., Starting 02/20/2015, Until Discontinued, Normal    magnesium oxide (MAG-OX) 400 (241.3 Mg) MG tablet Take 1 tablet (400 mg total) by mouth daily., Starting 02/20/2015, Until Discontinued, Normal      CONTINUE these medications which have NOT CHANGED   Details  Multiple Vitamins-Minerals (MULTIVITAMIN WITH MINERALS) tablet Take 1 tablet by mouth daily., Until Discontinued, Historical Med    omeprazole (PRILOSEC) 20 MG capsule Take 20 mg by mouth daily., Until Discontinued, Historical Med      STOP taking these medications     aspirin EC 81 MG tablet          DISCHARGE INSTRUCTIONS:   Follow-up with Dr. Lisette Grinder 1 week Consider endoscopy as outpatient.  If you experience worsening of your admission symptoms, develop shortness of breath, life threatening emergency, suicidal or homicidal thoughts you must seek medical attention immediately by calling 911 or calling your MD immediately  if symptoms less severe.  You Must read complete instructions/literature along with all the possible adverse reactions/side effects for all the Medicines you take and that have been prescribed to you. Take any new Medicines after you have completely understood and accept all the possible adverse reactions/side effects.   Please note  You were cared for by a hospitalist during your hospital stay. If you have any questions about your discharge medications or the care you received while you were in the hospital after you are discharged, you can call the unit and asked to speak with the hospitalist on  call if the hospitalist that took care of you is not available. Once you are discharged, your primary care physician will handle any further medical issues. Please note that NO REFILLS for any discharge medications will be authorized once you are discharged, as it is imperative that you return to your primary care  physician (or establish a relationship with a primary care physician if you do not have one) for your aftercare needs so that they can reassess your need for medications and monitor your lab values.    Today   CHIEF COMPLAINT:   Chief Complaint  Patient presents with  . Emesis  . Diarrhea    HISTORY OF PRESENT ILLNESS:  Daniel Edwards  is a 81 y.o. male presented with nausea vomiting and diarrhea.   VITAL SIGNS:  Blood pressure 183/99, pulse 83, temperature 98.3 F (36.8 C), temperature source Oral, resp. rate 17, height 5\' 10"  (1.778 m), weight 65 kg (143 lb 4.8 oz), SpO2 99 %.   PHYSICAL EXAMINATION:  GENERAL:  81 y.o.-year-old patient lying in the bed with no acute distress.  EYES: Pupils equal, round, reactive to light and accommodation. No scleral icterus. Extraocular muscles intact.  HEENT: Head atraumatic, normocephalic. Oropharynx and nasopharynx clear.  NECK:  Supple, no jugular venous distention. No thyroid enlargement, no tenderness.  LUNGS: Normal breath sounds bilaterally, no wheezing, rales,rhonchi or crepitation. No use of accessory muscles of respiration.  CARDIOVASCULAR: S1, S2 normal. No murmurs, rubs, or gallops.  ABDOMEN: Soft, non-tender, non-distended. Bowel sounds present. No organomegaly or mass.  EXTREMITIES: No pedal edema, cyanosis, or clubbing.  NEUROLOGIC: Cranial nerves II through XII are intact. Muscle strength 5/5 in all extremities. Sensation intact. Gait not checked.  PSYCHIATRIC: The patient is alert and oriented x 3.  SKIN: No obvious rash, lesion, or ulcer.   DATA REVIEW:   CBC  Recent Labs Lab 02/18/15 2059  WBC 13.8*  HGB 13.7  HCT 41.6  PLT 249    Chemistries   Recent Labs Lab 02/18/15 2059  02/20/15 0633 02/20/15 1145  NA 131*  --  137  --   K 3.0*  --  3.9  --   CL 93*  --  104  --   CO2 24  --  24  --   GLUCOSE 163*  --  91  --   BUN 8  --  6  --   CREATININE 0.82  --  0.88  --   CALCIUM 7.2*  --  6.9*  --   MG   --   < > 0.9* 2.5*  AST 25  --   --   --   ALT 16*  --   --   --   ALKPHOS 59  --   --   --   BILITOT 0.8  --   --   --   < > = values in this interval not displayed.  Cardiac Enzymes  Recent Labs Lab 02/19/15 1915  TROPONINI 0.05*    Microbiology Results  Results for orders placed or performed in visit on 01/26/13  Culture, blood (single)     Status: None   Collection Time: 01/26/13  1:29 PM  Result Value Ref Range Status   Micro Text Report   Final       COMMENT                   NO GROWTH AEROBICALLY/ANAEROBICALLY IN 5 DAYS   ANTIBIOTIC  Culture, blood (single)     Status: None   Collection Time: 01/26/13  1:29 PM  Result Value Ref Range Status   Micro Text Report   Final       COMMENT                   NO GROWTH AEROBICALLY/ANAEROBICALLY IN 5 DAYS   ANTIBIOTIC                                                        RADIOLOGY:  Mr Abdomen W Wo Contrast  02/19/2015  CLINICAL DATA:  Liver lesions on CT. Multiple probable renal cysts. Nausea/vomiting/ diarrhea. EXAM: MRI ABDOMEN WITHOUT AND WITH CONTRAST TECHNIQUE: Multiplanar multisequence MR imaging of the abdomen was performed both before and after the administration of intravenous contrast. CONTRAST:  65mL MULTIHANCE GADOBENATE DIMEGLUMINE 529 MG/ML IV SOLN COMPARISON:  CT abdomen pelvis dated 02/19/2015. FINDINGS: Motion degraded images. Lower chest:  Lung bases are clear. Hepatobiliary: 12 mm probable hemangioma in the left hepatic dome (series 15/ image 6), poorly evaluated due to motion degradation. 11 mm cyst inferiorly in the right hepatic lobe (series 15/ image 17). No suspicious hepatic lesions. No hepatic steatosis. Layering gallstones (series 15/image 14). No associated inflammatory changes. No intrahepatic or extrahepatic ductal dilatation. Pancreas: Within normal limits. Spleen: Within normal limits. Adrenals/Urinary Tract: Adrenal glands are within normal  limits. Multiple bilateral renal cysts, including: --2.4 cm lateral interpolar right renal cyst (series 15/ image 14), benign (Bosniak I) --3.5 cm complex/irregular medial left upper pole renal cyst (series 15/image 10), benign (Bosniak I) --4.0 cm complex/septated left lower pole renal cyst (series 15/ image 18), benign (Bosniak II) No suspicious/enhancing renal lesions.  No hydronephrosis. Stomach/Bowel: Mass-like wall thickening/prominent rugal folds along the greater curvature of the stomach (series 15/image 17). Visualized bowel is unremarkable. Vascular/Lymphatic: No evidence of abdominal aortic aneurysm. No suspicious abdominal lymphadenopathy. Other: No abdominal ascites. Musculoskeletal: No focal osseous lesions. IMPRESSION: Motion degraded images. 12 mm probable hemangioma in the left hepatic dome. 11 mm cyst inferiorly in the right hepatic lobe. No suspicious hepatic lesions. Multiple bilateral renal cysts, benign (Bosniak I-II). No suspicious/enhancing renal lesions. Mass-like wall thickening/prominent rugal folds along the greater curvature of the stomach, possibly reflecting gastritis. Endoscopic correlation is suggested. Electronically Signed   By: Julian Hy M.D.   On: 02/19/2015 17:08   Ct Abdomen Pelvis W Contrast  02/19/2015  CLINICAL DATA:  Vomiting and diarrhea, onset yesterday. Weakness and dizziness. EXAM: CT ABDOMEN AND PELVIS WITH CONTRAST TECHNIQUE: Multidetector CT imaging of the abdomen and pelvis was performed using the standard protocol following bolus administration of intravenous contrast. CONTRAST:  158mL OMNIPAQUE IOHEXOL 350 MG/ML SOLN COMPARISON:  None. FINDINGS: Lower chest:  The included lung bases are clear. Liver: There are multiple hypodense liver lesions scattered throughout the right and left lobe. Largest lesion in the dome measures 1.5 cm, with slightly irregular margins. There is question of capsular retraction on the coronal reformat. There is a 1.3 cm lesion  in the subcapsular right lobe. Additional smaller lesions are seen scattered throughout both lobes. These are incompletely characterized. Hepatobiliary: Gallbladder physiologically distended, no calcified stone. No biliary dilatation. Pancreas: Atrophic pancreas without ductal dilatation or inflammation. Spleen: Normal. Adrenal glands: No nodule.  Mild thickening bilaterally. Kidneys: Symmetric renal enhancement. No hydronephrosis.  Multiple cystic lesions within both kidneys. Arising from the lower left kidney is a 4.1 cm cyst with septation versus 2 adjacent cysts. Questionable peripheral calcification about the inferior aspect. Exophytic cystic lesion arises from the upper left kidney measuring 3 cm. All of the cysts measure simple fluid density. Stomach/Bowel: Stomach distended with enteric contrast There are no dilated or thickened small bowel loops. Oral contrast reaches to the distal small bowel moderate volume of stool in the proximal colon, small volume of stool with distal colon. There is distal colonic diverticulosis without diverticulitis. The appendix is not seen, no pericecal or right lower quadrant inflammatory change. Vascular/Lymphatic: No retroperitoneal adenopathy. Abdominal aorta is normal in caliber. Dense atherosclerosis of the abdominal aorta without aneurysm. Reproductive: Heterogeneous prostate gland, prominent size. Bladder: No wall thickening. Probable left lateral bladder diverticulum. Other: No free air, free fluid, or intra-abdominal fluid collection. Musculoskeletal: There are no acute or suspicious osseous abnormalities. Degenerative change in scoliotic curvature in the spine. IMPRESSION: 1. No acute abnormality in the abdomen/pelvis. 2. Diverticulosis without diverticulitis. No signs of bowel inflammation. 3. Multiple hypodense hepatic lesions. Largest in the hepatic dome measures 1.5 cm with questionable but not definitive subcapsular retraction. These lesions are indeterminate, and  further characterization recommended with hepatic protocol MRI without and with contrast. 4. Multiple hypodense renal lesions, majority of represent simple cysts. Lesion arising from the lower left kidney contains internal septa, with question of fine calcification in the periphery. This could be a Bosniak 2 cyst, and no dedicated further follow-up is needed, however could also be assessed at the time of hepatic lesion characterization with MRI. 5. Atherosclerosis. Electronically Signed   By: Jeb Levering M.D.   On: 02/19/2015 01:16   Dg Chest Portable 1 View  02/19/2015  CLINICAL DATA:  Dyspnea.  Hypoxia and cough. EXAM: PORTABLE CHEST 1 VIEW COMPARISON:  02/23/2013, report chest radiograph from 02/11/2015, images not available FINDINGS: The lungs are hyperinflated, and this change. Heart at the upper limits of normal in size. No pulmonary edema, confluent airspace disease, pleural effusion or pneumothorax. No acute osseous abnormalities. IMPRESSION: Hyperinflation with likely emphysema.  No focal abnormality. Electronically Signed   By: Jeb Levering M.D.   On: 02/19/2015 00:32    Management plans discussed with the patient, family and they are in agreement.  CODE STATUS:     Code Status Orders        Start     Ordered   02/19/15 0725  Full code   Continuous     02/19/15 0724    Code Status History    Date Active Date Inactive Code Status Order ID Comments User Context   This patient has a current code status but no historical code status.    Advance Directive Documentation        Most Recent Value   Type of Advance Directive  Living will   Pre-existing out of facility DNR order (yellow form or pink MOST form)     "MOST" Form in Place?        TOTAL TIME TAKING CARE OF THIS PATIENT: 35 minutes.    Loletha Grayer M.D on 02/20/2015 at 5:05 PM  Between 7am to 6pm - Pager - [phone]  After 6pm go to [email or web address] - password [email or web address]   Office  [phone]  CC: Primary care physician; Madelyn Brunner, MD

## 2015-02-20 NOTE — Progress Notes (Signed)
Critical lab Mag 0.9 called to Dr Leslye Peer. Patient Status acknowledged.

## 2015-07-20 ENCOUNTER — Encounter: Payer: Self-pay | Admitting: Emergency Medicine

## 2015-07-20 ENCOUNTER — Emergency Department: Payer: PPO

## 2015-07-20 ENCOUNTER — Inpatient Hospital Stay
Admission: EM | Admit: 2015-07-20 | Discharge: 2015-07-21 | DRG: 641 | Disposition: A | Discharge status: Home / Self Care | Payer: PPO | Attending: Internal Medicine | Admitting: Internal Medicine

## 2015-07-20 DIAGNOSIS — I10 Essential (primary) hypertension: Secondary | ICD-10-CM | POA: Diagnosis not present

## 2015-07-20 DIAGNOSIS — F1010 Alcohol abuse, uncomplicated: Secondary | ICD-10-CM | POA: Diagnosis not present

## 2015-07-20 DIAGNOSIS — J440 Chronic obstructive pulmonary disease with acute lower respiratory infection: Secondary | ICD-10-CM | POA: Diagnosis present

## 2015-07-20 DIAGNOSIS — J189 Pneumonia, unspecified organism: Secondary | ICD-10-CM

## 2015-07-20 DIAGNOSIS — A419 Sepsis, unspecified organism: Secondary | ICD-10-CM | POA: Diagnosis not present

## 2015-07-20 DIAGNOSIS — K219 Gastro-esophageal reflux disease without esophagitis: Secondary | ICD-10-CM | POA: Diagnosis present

## 2015-07-20 DIAGNOSIS — I480 Paroxysmal atrial fibrillation: Secondary | ICD-10-CM | POA: Diagnosis present

## 2015-07-20 DIAGNOSIS — E871 Hypo-osmolality and hyponatremia: Secondary | ICD-10-CM | POA: Diagnosis not present

## 2015-07-20 DIAGNOSIS — Z79899 Other long term (current) drug therapy: Secondary | ICD-10-CM | POA: Diagnosis not present

## 2015-07-20 DIAGNOSIS — Z809 Family history of malignant neoplasm, unspecified: Secondary | ICD-10-CM | POA: Diagnosis not present

## 2015-07-20 DIAGNOSIS — N400 Enlarged prostate without lower urinary tract symptoms: Secondary | ICD-10-CM | POA: Diagnosis present

## 2015-07-20 DIAGNOSIS — I482 Chronic atrial fibrillation, unspecified: Secondary | ICD-10-CM | POA: Diagnosis present

## 2015-07-20 DIAGNOSIS — F17210 Nicotine dependence, cigarettes, uncomplicated: Secondary | ICD-10-CM | POA: Diagnosis not present

## 2015-07-20 DIAGNOSIS — I4891 Unspecified atrial fibrillation: Secondary | ICD-10-CM | POA: Diagnosis not present

## 2015-07-20 DIAGNOSIS — R531 Weakness: Secondary | ICD-10-CM | POA: Diagnosis not present

## 2015-07-20 DIAGNOSIS — R42 Dizziness and giddiness: Secondary | ICD-10-CM | POA: Diagnosis not present

## 2015-07-20 DIAGNOSIS — R0989 Other specified symptoms and signs involving the circulatory and respiratory systems: Secondary | ICD-10-CM | POA: Diagnosis not present

## 2015-07-20 HISTORY — DX: Male erectile dysfunction, unspecified: N52.9

## 2015-07-20 HISTORY — DX: Simple chronic bronchitis: J41.0

## 2015-07-20 HISTORY — DX: Benign prostatic hyperplasia without lower urinary tract symptoms: N40.0

## 2015-07-20 HISTORY — DX: Chronic obstructive pulmonary disease, unspecified: J44.9

## 2015-07-20 LAB — TROPONIN I: TROPONIN I: 0.03 ng/mL (ref <0.031)

## 2015-07-20 LAB — URINALYSIS COMPLETE WITH MICROSCOPIC (ARMC ONLY)
Bacteria, UA: NONE SEEN
Bilirubin Urine: NEGATIVE
Bilirubin Urine: NEGATIVE
Glucose, UA: NEGATIVE mg/dL
Glucose, UA: NEGATIVE mg/dL
HGB URINE DIPSTICK: NEGATIVE
KETONES UR: NEGATIVE mg/dL
LEUKOCYTES UA: NEGATIVE
Leukocytes, UA: NEGATIVE
NITRITE: NEGATIVE
Nitrite: NEGATIVE
PH: 5 (ref 5.0–8.0)
PROTEIN: NEGATIVE mg/dL
PROTEIN: NEGATIVE mg/dL
RBC / HPF: NONE SEEN RBC/hpf (ref 0–5)
SPECIFIC GRAVITY, URINE: 1.005 (ref 1.005–1.030)
SPECIFIC GRAVITY, URINE: 1.003 — AB (ref 1.005–1.030)
Squamous Epithelial / LPF: NONE SEEN
Squamous Epithelial / LPF: NONE SEEN
pH: 6 (ref 5.0–8.0)

## 2015-07-20 LAB — ETHANOL: Alcohol, Ethyl (B): <5 mg/dL (ref <5)

## 2015-07-20 LAB — COMPREHENSIVE METABOLIC PANEL
ALT: 42 U/L (ref 17–63)
AST: 37 U/L (ref 15–41)
Albumin: 3.1 g/dL — ABNORMAL LOW (ref 3.5–5.0)
Alkaline Phosphatase: 146 U/L — ABNORMAL HIGH (ref 38–126)
Anion gap: 16 — ABNORMAL HIGH (ref 5–15)
BILIRUBIN TOTAL: 0.8 mg/dL (ref 0.3–1.2)
BUN: 9 mg/dL (ref 6–20)
CHLORIDE: 92 mmol/L — AB (ref 101–111)
CO2: 21 mmol/L — ABNORMAL LOW (ref 22–32)
CREATININE: 0.81 mg/dL (ref 0.61–1.24)
Calcium: 6.7 mg/dL — ABNORMAL LOW (ref 8.9–10.3)
Glucose, Bld: 152 mg/dL — ABNORMAL HIGH (ref 65–99)
POTASSIUM: 3 mmol/L — AB (ref 3.5–5.1)
Sodium: 129 mmol/L — ABNORMAL LOW (ref 135–145)
TOTAL PROTEIN: 6.7 g/dL (ref 6.5–8.1)

## 2015-07-20 LAB — CBC
HCT: 37.2 % — ABNORMAL LOW (ref 40.0–52.0)
HEMOGLOBIN: 12.5 g/dL — AB (ref 13.0–18.0)
MCH: 29.3 pg (ref 26.0–34.0)
MCHC: 33.7 g/dL (ref 32.0–36.0)
MCV: 87 fL (ref 80.0–100.0)
PLATELETS: 370 10*3/uL (ref 150–440)
RBC: 4.28 MIL/uL — AB (ref 4.40–5.90)
RDW: 13.6 % (ref 11.5–14.5)
WBC: 17 10*3/uL — AB (ref 3.8–10.6)

## 2015-07-20 LAB — APTT: aPTT: 32 seconds (ref 24–36)

## 2015-07-20 LAB — LACTIC ACID, PLASMA
LACTIC ACID, VENOUS: 1 mmol/L (ref 0.5–2.0)
LACTIC ACID, VENOUS: 1.2 mmol/L (ref 0.5–2.0)

## 2015-07-20 LAB — PROTIME-INR
INR: 1.16
Prothrombin Time: 15 seconds (ref 11.4–15.0)

## 2015-07-20 LAB — MAGNESIUM: MAGNESIUM: 0.3 mg/dL — AB (ref 1.7–2.4)

## 2015-07-20 LAB — TSH: TSH: 0.638 u[IU]/mL (ref 0.350–4.500)

## 2015-07-20 LAB — LIPASE, BLOOD: Lipase: 16 U/L (ref 11–51)

## 2015-07-20 MED ORDER — MAGNESIUM OXIDE 400 (241.3 MG) MG PO TABS
800.0000 mg | ORAL_TABLET | ORAL | Status: AC
  Administered 2015-07-20 – 2015-07-21 (×3): 800 mg via ORAL
  Filled 2015-07-20 (×3): qty 2

## 2015-07-20 MED ORDER — SODIUM CHLORIDE 0.9 % IV BOLUS (SEPSIS)
1000.0000 mL | Freq: Once | INTRAVENOUS | Status: AC
  Administered 2015-07-20: 1000 mL via INTRAVENOUS

## 2015-07-20 MED ORDER — POTASSIUM CHLORIDE CRYS ER 20 MEQ PO TBCR
40.0000 meq | EXTENDED_RELEASE_TABLET | ORAL | Status: AC
  Administered 2015-07-20: 40 meq via ORAL
  Filled 2015-07-20: qty 2

## 2015-07-20 MED ORDER — METOPROLOL TARTRATE 25 MG PO TABS
25.0000 mg | ORAL_TABLET | Freq: Two times a day (BID) | ORAL | Status: DC
  Administered 2015-07-20 – 2015-07-21 (×3): 25 mg via ORAL
  Filled 2015-07-20 (×3): qty 1

## 2015-07-20 MED ORDER — MAGNESIUM SULFATE 4 GM/100ML IV SOLN
4.0000 g | Freq: Once | INTRAVENOUS | Status: AC
  Administered 2015-07-20: 4 g via INTRAVENOUS
  Filled 2015-07-20: qty 100

## 2015-07-20 MED ORDER — LORAZEPAM 1 MG PO TABS: 0.0000 mg | ORAL_TABLET | Freq: Four times a day (QID) | ORAL | Status: DC

## 2015-07-20 MED ORDER — POLYETHYLENE GLYCOL 3350 17 G PO PACK: 17.0000 g | PACK | Freq: Every day | ORAL | Status: DC | PRN

## 2015-07-20 MED ORDER — SODIUM CHLORIDE 0.9% FLUSH
3.0000 mL | Freq: Two times a day (BID) | INTRAVENOUS | Status: DC
  Administered 2015-07-20: 3 mL via INTRAVENOUS

## 2015-07-20 MED ORDER — SODIUM CHLORIDE 0.9 % IV SOLN: 250.0000 mL | INTRAVENOUS | Status: DC | PRN

## 2015-07-20 MED ORDER — MAGNESIUM OXIDE 400 (241.3 MG) MG PO TABS
ORAL_TABLET | ORAL | Status: AC
  Filled 2015-07-20: qty 2

## 2015-07-20 MED ORDER — MAGNESIUM SULFATE 2 GM/50ML IV SOLN
2.0000 g | Freq: Once | INTRAVENOUS | Status: AC
  Administered 2015-07-20: 2 g via INTRAVENOUS
  Filled 2015-07-20: qty 50

## 2015-07-20 MED ORDER — PANTOPRAZOLE SODIUM 40 MG PO TBEC
40.0000 mg | DELAYED_RELEASE_TABLET | Freq: Every day | ORAL | Status: DC
  Administered 2015-07-21: 40 mg via ORAL
  Filled 2015-07-20: qty 1

## 2015-07-20 MED ORDER — DEXTROSE 5 % IV SOLN
500.0000 mg | Freq: Once | INTRAVENOUS | Status: DC
  Administered 2015-07-20: 500 mg via INTRAVENOUS
  Filled 2015-07-20: qty 500

## 2015-07-20 MED ORDER — ONDANSETRON HCL 4 MG/2ML IJ SOLN: 4.0000 mg | Freq: Four times a day (QID) | INTRAMUSCULAR | Status: DC | PRN

## 2015-07-20 MED ORDER — SODIUM CHLORIDE 0.9 % IV BOLUS (SEPSIS)
250.0000 mL | Freq: Once | INTRAVENOUS | Status: AC
  Administered 2015-07-20: 250 mL via INTRAVENOUS

## 2015-07-20 MED ORDER — LORAZEPAM 2 MG/ML IJ SOLN: 1.0000 mg | Freq: Four times a day (QID) | INTRAMUSCULAR | Status: DC | PRN

## 2015-07-20 MED ORDER — ADULT MULTIVITAMIN W/MINERALS CH
1.0000 | ORAL_TABLET | Freq: Every day | ORAL | Status: DC
  Administered 2015-07-21: 1 via ORAL
  Filled 2015-07-20 (×2): qty 1

## 2015-07-20 MED ORDER — ACETAMINOPHEN 325 MG PO TABS: 650.0000 mg | ORAL_TABLET | Freq: Four times a day (QID) | ORAL | Status: DC | PRN

## 2015-07-20 MED ORDER — DEXTROSE 5 % IV SOLN: 500.0000 mg | INTRAVENOUS | Status: DC

## 2015-07-20 MED ORDER — THIAMINE HCL 100 MG/ML IJ SOLN: 100.0000 mg | Freq: Every day | INTRAMUSCULAR | Status: DC

## 2015-07-20 MED ORDER — TAMSULOSIN HCL 0.4 MG PO CAPS
0.4000 mg | ORAL_CAPSULE | Freq: Every day | ORAL | Status: DC
  Administered 2015-07-21: 0.4 mg via ORAL
  Filled 2015-07-20: qty 1

## 2015-07-20 MED ORDER — ENOXAPARIN SODIUM 40 MG/0.4ML ~~LOC~~ SOLN
40.0000 mg | SUBCUTANEOUS | Status: DC
  Administered 2015-07-20: 40 mg via SUBCUTANEOUS
  Filled 2015-07-20: qty 0.4

## 2015-07-20 MED ORDER — LORAZEPAM 1 MG PO TABS: 0.0000 mg | ORAL_TABLET | Freq: Two times a day (BID) | ORAL | Status: DC

## 2015-07-20 MED ORDER — SODIUM CHLORIDE 0.9 % IV BOLUS (SEPSIS): 1000.0000 mL | Freq: Once | INTRAVENOUS | Status: DC

## 2015-07-20 MED ORDER — BISACODYL 10 MG RE SUPP: 10.0000 mg | Freq: Every day | RECTAL | Status: DC | PRN

## 2015-07-20 MED ORDER — SODIUM CHLORIDE 0.9% FLUSH: 3.0000 mL | INTRAVENOUS | Status: DC | PRN

## 2015-07-20 MED ORDER — DEXTROSE 5 % IV SOLN: 1.0000 g | INTRAVENOUS | Status: DC

## 2015-07-20 MED ORDER — DEXTROSE 5 % IV SOLN
1.0000 g | Freq: Once | INTRAVENOUS | Status: AC
  Administered 2015-07-20: 1 g via INTRAVENOUS
  Filled 2015-07-20: qty 10

## 2015-07-20 MED ORDER — ASPIRIN EC 81 MG PO TBEC
81.0000 mg | DELAYED_RELEASE_TABLET | Freq: Every day | ORAL | Status: DC
  Administered 2015-07-21: 81 mg via ORAL
  Filled 2015-07-20: qty 1

## 2015-07-20 MED ORDER — ACETAMINOPHEN 650 MG RE SUPP: 650.0000 mg | Freq: Four times a day (QID) | RECTAL | Status: DC | PRN

## 2015-07-20 MED ORDER — POTASSIUM CHLORIDE CRYS ER 20 MEQ PO TBCR
EXTENDED_RELEASE_TABLET | ORAL | Status: AC
  Administered 2015-07-20: 20 meq
  Filled 2015-07-20: qty 2

## 2015-07-20 MED ORDER — MAGNESIUM OXIDE 400 (241.3 MG) MG PO TABS
400.0000 mg | ORAL_TABLET | Freq: Every day | ORAL | Status: DC
  Administered 2015-07-21: 400 mg via ORAL
  Filled 2015-07-20: qty 1

## 2015-07-20 MED ORDER — VITAMIN B-1 100 MG PO TABS
100.0000 mg | ORAL_TABLET | Freq: Every day | ORAL | Status: DC
  Administered 2015-07-20 – 2015-07-21 (×2): 100 mg via ORAL
  Filled 2015-07-20 (×2): qty 1

## 2015-07-20 MED ORDER — LORAZEPAM 1 MG PO TABS: 1.0000 mg | ORAL_TABLET | Freq: Four times a day (QID) | ORAL | Status: DC | PRN

## 2015-07-20 MED ORDER — FOLIC ACID 1 MG PO TABS
1.0000 mg | ORAL_TABLET | Freq: Every day | ORAL | Status: DC
  Administered 2015-07-20 – 2015-07-21 (×2): 1 mg via ORAL
  Filled 2015-07-20 (×2): qty 1

## 2015-07-20 MED ORDER — ONDANSETRON HCL 4 MG PO TABS: 4.0000 mg | ORAL_TABLET | Freq: Four times a day (QID) | ORAL | Status: DC | PRN

## 2015-07-20 NOTE — ED Notes (Signed)
Intermittent dizzyness x 2 days, denies chest pain or SOB. Smile and grips symmetrical. Denies headache. Monitor irregular rhythm with some p waves noted rate 120 to 150.

## 2015-07-20 NOTE — H&P (Signed)
Daniel Edwards at [place] NAME: Daniel Edwards    MR#:  [id number]  DATE OF BIRTH:  [birth year]  DATE OF ADMISSION:  07/20/2015  PRIMARY CARE PHYSICIAN: Madelyn Brunner, MD   REQUESTING/REFERRING PHYSICIAN: Dr. Joni Fears  CHIEF COMPLAINT:   Chief Complaint  Patient presents with  . Dizziness    HISTORY OF PRESENT ILLNESS:  Daniel Edwards  is a 81 y.o. male with a known history of Hypertension, COPD, alcohol abuse present to the emergency room complaining of 2 days of dizziness. He was found to be in atrial fibrillation. Initially fluids given with which his tachycardia improved. Presently in normal sinus rhythm. Patient has been found to have severe hypomagnesemia of 0.3. He has had low magnesium levels of 0.6 in the past on admission. He drinks 5-6 cans of beer every day for many years. Continues to smoke. He has no shortness of breath or chest pain or vomiting or abdominal pain or diarrhea. No recent change in medications. No history of atrial fibrillation or recent change in medications.  PAST MEDICAL HISTORY:   Past Medical History  Diagnosis Date  . Essential hypertension   . GERD (gastroesophageal reflux disease)   . Simple chronic bronchitis ([place])   . Erectile dysfunction   . BPH (benign prostatic hyperplasia)   . COPD (chronic obstructive pulmonary disease) ([place])     PAST SURGICAL HISTORY:   Past Surgical History  Procedure Laterality Date  . Inguinal hernia repair      SOCIAL HISTORY:   Social History  Substance Use Topics  . Smoking status: Current Every Day Smoker -- 1.00 packs/day    Types: Cigarettes  . Smokeless tobacco: Not on file  . Alcohol Use: 0.0 oz/week    0 Standard drinks or equivalent per week    FAMILY HISTORY:   Family History  Problem Relation Age of Onset  . Cancer Mother     DRUG ALLERGIES:  No Known Allergies  REVIEW OF SYSTEMS:   Review of Systems  Constitutional: Positive for  malaise/fatigue. Negative for fever and chills.  HENT: Negative for sore throat.   Eyes: Negative for blurred vision, double vision and pain.  Respiratory: Negative for cough, hemoptysis, shortness of breath and wheezing.   Cardiovascular: Positive for palpitations. Negative for chest pain, orthopnea and leg swelling.  Gastrointestinal: Negative for heartburn, nausea, vomiting, abdominal pain, diarrhea and constipation.  Genitourinary: Negative for dysuria and hematuria.  Musculoskeletal: Negative for back pain and joint pain.  Skin: Negative for rash.  Neurological: Positive for dizziness and weakness. Negative for sensory change, speech change, focal weakness and headaches.  Endo/Heme/Allergies: Does not bruise/bleed easily.  Psychiatric/Behavioral: Negative for depression. The patient is not nervous/anxious.     MEDICATIONS AT HOME:   Prior to Admission medications   Medication Sig Start Date End Date Taking? Authorizing Provider  amLODipine (NORVASC) 5 MG tablet Take 1 tablet (5 mg total) by mouth daily. 02/20/15  Yes Richard Leslye Peer, MD  omeprazole (PRILOSEC) 20 MG capsule Take 20 mg by mouth daily.   Yes Historical Provider, MD  tamsulosin (FLOMAX) 0.4 MG CAPS capsule Take 0.4 mg by mouth daily. 07/19/15  Yes Historical Provider, MD  magnesium oxide (MAG-OX) 400 (241.3 Mg) MG tablet Take 1 tablet (400 mg total) by mouth daily. Patient not taking: Reported on 07/20/2015 02/20/15   Loletha Grayer, MD     VITAL SIGNS:  Blood pressure 119/83, pulse 120, temperature 98.1 F (36.7 C), temperature  source Oral, resp. rate 18, height 5\' 10"  (1.778 m), weight 66.225 kg (146 lb), SpO2 94 %.  PHYSICAL EXAMINATION:  Physical Exam  GENERAL:  81 y.o.-year-old patient lying in the bed with no acute distress.  EYES: Pupils equal, round, reactive to light and accommodation. No scleral icterus. Extraocular muscles intact.  HEENT: Head atraumatic, normocephalic. Oropharynx and nasopharynx clear. No  oropharyngeal erythema, moist oral mucosa  NECK:  Supple, no jugular venous distention. No thyroid enlargement, no tenderness.  LUNGS: Normal breath sounds bilaterally, no wheezing, rales, rhonchi. No use of accessory muscles of respiration.  CARDIOVASCULAR: S1, S2 , Tachycardia. ABDOMEN: Soft, nontender, nondistended. Bowel sounds present. No organomegaly or mass.  EXTREMITIES: No pedal edema, cyanosis, or clubbing. + 2 pedal & radial pulses b/l.   NEUROLOGIC: Cranial nerves II through XII are intact. No focal Motor or sensory deficits appreciated b/l PSYCHIATRIC: The patient is alert and oriented x 3. Good affect.  SKIN: No obvious rash, lesion, or ulcer.   LABORATORY PANEL:   CBC  Recent Labs Lab 07/20/15 1235  WBC 17.0*  HGB 12.5*  HCT 37.2*  PLT 370   ------------------------------------------------------------------------------------------------------------------  Chemistries   Recent Labs Lab 07/20/15 1235  NA 129*  K 3.0*  CL 92*  CO2 21*  GLUCOSE 152*  BUN 9  CREATININE 0.81  CALCIUM 6.7*  MG 0.3*  AST 37  ALT 42  ALKPHOS 146*  BILITOT 0.8   ------------------------------------------------------------------------------------------------------------------  Cardiac Enzymes  Recent Labs Lab 07/20/15 1235  TROPONINI <0.03   ------------------------------------------------------------------------------------------------------------------  RADIOLOGY:  Dg Chest 2 View  07/20/2015  CLINICAL DATA:  Abnormal breath sounds left base. Dizziness. Cardiac arrhythmia. EXAM: CHEST  2 VIEW COMPARISON:  February 18, 2015 FINDINGS: There is no edema or consolidation. The heart size and pulmonary vascularity are normal. No adenopathy. There is mild degenerative change in the thoracic spine. IMPRESSION: No edema or consolidation. Electronically Signed   By: Lowella Grip III M.D.   On: 07/20/2015 13:56     IMPRESSION AND PLAN:   * Severe hypomagnesemia This is  due to excessive beer drinking. Will admit patient with telemetry monitoring. High risk for cardiac arrhythmias. Replace with IV 6 g magnesium sulfate. Also added oral replacement. Repeat magnesium tomorrow morning. Replace potassium.  * Paroxysmal atrial fibrillation This is likely due to hypomagnesemia. Now resolved. Patient has PACs. Had echocardiogram earlier this year which showed nothing acute or significant. Ejection fraction was 55%. TSH levels. Start metoprolol 25 twice a day with first dose now. Consult cardiology with [facility].  * COPD is stable. Continue inhalers.  * Hypertension Stop Norvasc as patient is being started on metoprolol.  * BPH. Continue Flomax.  * Alcohol abuse Counseled to quit. We will start patient on CIWA protocol.  * Mild hyponatremia likely due to alcohol.  * DVT prophylaxis with Lovenox.  All the records are reviewed and case discussed with ED provider. Management plans discussed with the patient, family and they are in agreement.  CODE STATUS: FULL CODE  TOTAL TIME TAKING CARE OF THIS PATIENT: 40 minutes.   Hillary Bow R M.D on 07/20/2015 at 3:15 PM  Between 7am to 6pm - Pager - [phone]  After 6pm go to [email or web address] - password [email or web address] [facility]  Office  [phone]  CC: Primary care physician; Madelyn Brunner, MD  Note: This dictation was prepared with Dragon dictation along with smaller phrase technology. Any transcriptional errors that result from this process are unintentional.

## 2015-07-20 NOTE — Progress Notes (Signed)
Pharmacy Antibiotic Note  Daniel Edwards is a 81 y.o. male admitted on 07/20/2015 with possible PNA/CAP?Marland Kitchen  Pharmacy has been consulted for Azithromycin/Ceftriaxone dosing.  Plan: Patient has received 1st doses of Ceftriaxone 1 gram and Azithromycin 500mg  IV in ER. Will continue with Ceftriaxone 1 gram IV q24h and Azithromycin 500mg  IV q24h.   Height: 5\' 10"  (177.8 cm) Weight: 146 lb (66.225 kg) IBW/kg (Calculated) : 73  Temp (24hrs), Avg:98.1 F (36.7 C), Min:98.1 F (36.7 C), Max:98.1 F (36.7 C)   Recent Labs Lab 07/20/15 1235 07/20/15 1331  WBC 17.0*  --   CREATININE 0.81  --   LATICACIDVEN  --  1.0    Estimated Creatinine Clearance: 70.4 mL/min (by C-G formula based on Cr of 0.81).    No Known Allergies  Antimicrobials this admission: CTX 6/12 >>   Azith 6/12 >>    Dose adjustments this admission:    Microbiology results: 6/12 BCx: pending 6/12 UCx: pending ,  UA negative 6/12 Sputum:    6/12 CXR negative   MRSA PCR:    Thank you for allowing pharmacy to be a part of this patient's care.  Lovada Barwick A 07/20/2015 2:54 PM

## 2015-07-20 NOTE — ED Provider Notes (Signed)
[facility] Emergency Department Provider Note  ____________________________________________  Time seen: 1240  I have reviewed the triage vital signs and the nursing notes.   HISTORY  Chief Complaint Dizziness Level 5 caveat:  Portions of the history and physical were unable to be obtained due to the patient's acute illness and being a poor historian    HPI Daniel Edwards is a 81 y.o. male brought to the ED for intermittent dizziness worse with standing and generalized weakness for the past 2 days. No headache numbness tingling or weakness. Denies chest pain but has some shortness of breath and nonproductive cough. Noted by EMS to have a truck fibrillation with a heart rate of about 150 on scene. Patient denies history of atrial fibrillation.     Past Medical History  Diagnosis Date  . Essential hypertension   . GERD (gastroesophageal reflux disease)      Patient Active Problem List   Diagnosis Date Noted  . Chest pain 02/19/2015  . Atypical chest pain 02/19/2015     Past Surgical History  Procedure Laterality Date  . Inguinal hernia repair       Current Outpatient Rx  Name  Route  Sig  Dispense  Refill  . amLODipine (NORVASC) 5 MG tablet   Oral   Take 1 tablet (5 mg total) by mouth daily.   30 tablet   0   . omeprazole (PRILOSEC) 20 MG capsule   Oral   Take 20 mg by mouth daily.         . tamsulosin (FLOMAX) 0.4 MG CAPS capsule   Oral   Take 0.4 mg by mouth daily.         . magnesium oxide (MAG-OX) 400 (241.3 Mg) MG tablet   Oral   Take 1 tablet (400 mg total) by mouth daily. Patient not taking: Reported on 07/20/2015   30 tablet   0      Allergies Review of patient's allergies indicates no known allergies.   No family history on file.  Social History Social History  Substance Use Topics  . Smoking status: Current Every Day Smoker -- 1.00 packs/day    Types: Cigarettes  . Smokeless tobacco: None  . Alcohol  Use: Yes    Review of Systems  Constitutional:   No fever or chills.  Eyes:   No vision changes.  ENT:   No sore throat. No rhinorrhea. Cardiovascular:   No chest pain. Respiratory:   Positive dyspnea and nonproductive cough. Gastrointestinal:   Negative for abdominal pain, vomiting and diarrhea.  Genitourinary:   Negative for dysuria or difficulty urinating. Musculoskeletal:   Negative for focal pain or swelling Neurological:   Negative for headaches 10-point ROS otherwise negative.  ____________________________________________   PHYSICAL EXAM:  VITAL SIGNS: ED Triage Vitals  Enc Vitals Group     BP 07/20/15 1239 119/83 mmHg     Pulse Rate 07/20/15 1239 120     Resp 07/20/15 1239 18     Temp 07/20/15 1239 98.1 F (36.7 C)     Temp Source 07/20/15 1239 Oral     SpO2 07/20/15 1239 94 %     Weight 07/20/15 1239 146 lb (66.225 kg)     Height 07/20/15 1239 5\' 10"  (1.778 m)     Head Cir --      Peak Flow --      Pain Score --      Pain Loc --      Pain Edu? --  Excl. in [place]? --     Vital signs reviewed, nursing assessments reviewed.   Constitutional:   Alert and orientedTo person and place. Ill-appearing. Eyes:   No scleral icterus. No conjunctival pallor. PERRL. EOMI.  No nystagmus. ENT   Head:   Normocephalic and atraumatic.   Nose:   No congestion/rhinnorhea. No septal hematoma   Mouth/Throat:   Dry mucous membranes, no pharyngeal erythema. No peritonsillar mass.    Neck:   No stridor. No SubQ emphysema. No meningismus. Hematological/Lymphatic/Immunilogical:   No cervical lymphadenopathy. Cardiovascular:   Irregularly irregular rhythm, tachycardic heart rate 120. Symmetric bilateral radial and DP pulses.  No murmurs.  Respiratory:   Normal respiratory effort without tachypnea nor retractions. Positive inspiratory crackles in the left base. Gastrointestinal:   Soft and nontender. Non distended. There is no CVA tenderness.  No rebound, rigidity, or  guarding. Genitourinary:   deferred Musculoskeletal:   Nontender with normal range of motion in all extremities. No joint effusions.  No lower extremity tenderness.  No edema. Neurologic:   Normal speech and language.  CN 2-10 normal. Motor grossly intact. No gross focal neurologic deficits are appreciated.  Skin:    Skin is hot to the touch, dry and intact. No rash noted.  No petechiae, purpura, or bullae.  I repeated the oral temperature measurement on my exam, 98.0 Fahrenheit ____________________________________________    LABS (pertinent positives/negatives) (all labs ordered are listed, but only abnormal results are displayed) Labs Reviewed  CBC - Abnormal; Notable for the following:    WBC 17.0 (*)    RBC 4.28 (*)    Hemoglobin 12.5 (*)    HCT 37.2 (*)    All other components within normal limits  URINALYSIS COMPLETEWITH MICROSCOPIC ([facility] ONLY) - Abnormal; Notable for the following:    Color, Urine YELLOW (*)    APPearance CLEAR (*)    Ketones, ur TRACE (*)    Bacteria, UA RARE (*)    All other components within normal limits  COMPREHENSIVE METABOLIC PANEL - Abnormal; Notable for the following:    Sodium 129 (*)    Potassium 3.0 (*)    Chloride 92 (*)    CO2 21 (*)    Glucose, Bld 152 (*)    Calcium 6.7 (*)    Albumin 3.1 (*)    Alkaline Phosphatase 146 (*)    Anion gap 16 (*)    All other components within normal limits  MAGNESIUM - Abnormal; Notable for the following:    Magnesium 0.3 (*)    All other components within normal limits  CULTURE, BLOOD (ROUTINE X 2)  CULTURE, BLOOD (ROUTINE X 2)  CULTURE, EXPECTORATED SPUTUM-ASSESSMENT  URINE CULTURE  TROPONIN I  LACTIC ACID, PLASMA  LIPASE, BLOOD  APTT  PROTIME-INR  ETHANOL  LACTIC ACID, PLASMA   ____________________________________________   EKG  EKG interpreted by me Atrial fibrillation rapid ventricular response with a heart rate of 142. Normal axis and intervals. Normal ST segments and T waves.  Normal QRS complexes.  ____________________________________________    RADIOLOGY  Chest x-ray unremarkable  ____________________________________________   PROCEDURES CRITICAL CARE Performed by: Joni Fears, Franchelle Foskett   Total critical care time: 35 minutes  Critical care time was exclusive of separately billable procedures and treating other patients.  Critical care was necessary to treat or prevent imminent or life-threatening deterioration.  Critical care was time spent personally by me on the following activities: development of treatment plan with patient and/or surrogate as well as nursing, discussions with consultants, evaluation  of patient's response to treatment, examination of patient, obtaining history from patient or surrogate, ordering and performing treatments and interventions, ordering and review of laboratory studies, ordering and review of radiographic studies, pulse oximetry and re-evaluation of patient's condition.   ____________________________________________   INITIAL IMPRESSION / ASSESSMENT AND PLAN / ED COURSE  Pertinent labs & imaging results that were available during my care of the patient were reviewed by me and considered in my medical decision making (see chart for details).  Patient initially evaluated, concern for pneumonia but not qualifying as a code sepsis at this time with isolated A. fib with RVR and tachycardia. Other vital signs unremarkable. However, CBC result obtained at 1:05 PM which shows a white blood cell count of 17,000. I therefore initiated a code sepsis at that time. IV antibiotics with ceftriaxone and azithromycin, IV fluid bolus.   ----------------------------------------- 2:22 PM on 07/20/2015 ----------------------------------------- Heart rate has improved with IV fluids, now atrial fibrillation with a heart rate between 90 and 110, rate controlled. Blood pressure remained stable. Rest of the labs show a metabolic acidosis with  anion gap of 16, hyponatremia 129, hypokalemia to 3.0, hypomagnesemia to 0.3. Lactate is normal. No obvious infiltrate on chest x-ray but clinically the patient has pneumonia and I'm treating him as sepsis with IV fluid resuscitation. A bolus of IV magnesium. Case will be discussed with hospitalist for admission.     ____________________________________________   FINAL CLINICAL IMPRESSION(S) / ED DIAGNOSES  Final diagnoses:  Hypomagnesemia  CAP (community acquired pneumonia)  Generalized weakness  Hyponatremia New-onset atrial fibrillation with rapid ventricular response Sepsis    Portions of this note were generated with dragon dictation software. Dictation errors may occur despite best attempts at proofreading.   Carrie Mew, MD 07/20/15 [phone]

## 2015-07-21 DIAGNOSIS — I4891 Unspecified atrial fibrillation: Secondary | ICD-10-CM | POA: Diagnosis not present

## 2015-07-21 DIAGNOSIS — F1010 Alcohol abuse, uncomplicated: Secondary | ICD-10-CM | POA: Diagnosis not present

## 2015-07-21 DIAGNOSIS — I10 Essential (primary) hypertension: Secondary | ICD-10-CM | POA: Diagnosis not present

## 2015-07-21 LAB — CBC
HEMATOCRIT: 34 % — AB (ref 40.0–52.0)
Hemoglobin: 11.3 g/dL — ABNORMAL LOW (ref 13.0–18.0)
MCH: 29.2 pg (ref 26.0–34.0)
MCHC: 33.2 g/dL (ref 32.0–36.0)
MCV: 88 fL (ref 80.0–100.0)
PLATELETS: 338 10*3/uL (ref 150–440)
RBC: 3.87 MIL/uL — AB (ref 4.40–5.90)
RDW: 14.1 % (ref 11.5–14.5)
WBC: 12.1 10*3/uL — AB (ref 3.8–10.6)

## 2015-07-21 LAB — BASIC METABOLIC PANEL
Anion gap: 10 (ref 5–15)
BUN: 9 mg/dL (ref 6–20)
CHLORIDE: 105 mmol/L (ref 101–111)
CO2: 22 mmol/L (ref 22–32)
CREATININE: 0.9 mg/dL (ref 0.61–1.24)
Calcium: 6.6 mg/dL — ABNORMAL LOW (ref 8.9–10.3)
GFR calc Af Amer: >60 mL/min (ref >60)
GFR calc non Af Amer: >60 mL/min (ref >60)
Glucose, Bld: 162 mg/dL — ABNORMAL HIGH (ref 65–99)
POTASSIUM: 3.8 mmol/L (ref 3.5–5.1)
SODIUM: 137 mmol/L (ref 135–145)

## 2015-07-21 LAB — MAGNESIUM: Magnesium: 1.8 mg/dL (ref 1.7–2.4)

## 2015-07-21 LAB — TROPONIN I: Troponin I: <0.03 ng/mL (ref <0.031)

## 2015-07-21 MED ORDER — MAGNESIUM OXIDE 400 (241.3 MG) MG PO TABS: 400.0000 mg | ORAL_TABLET | Freq: Two times a day (BID) | ORAL | Status: DC

## 2015-07-21 MED ORDER — APIXABAN 5 MG PO TABS: 5.0000 mg | ORAL_TABLET | Freq: Two times a day (BID) | ORAL | Status: DC

## 2015-07-21 MED ORDER — METOPROLOL TARTRATE 25 MG PO TABS: 25.0000 mg | ORAL_TABLET | Freq: Two times a day (BID) | ORAL | Status: DC

## 2015-07-21 MED ORDER — APIXABAN 5 MG PO TABS
5.0000 mg | ORAL_TABLET | Freq: Two times a day (BID) | ORAL | Status: DC
  Administered 2015-07-21: 5 mg via ORAL
  Filled 2015-07-21: qty 1

## 2015-07-21 NOTE — Consult Note (Signed)
[facility] Cardiology Consultation Note  Patient ID: Daniel Edwards, MRN: [id number], DOB/AGE: [birth year] 81 y.o. Admit date: 07/20/2015   Date of Consult: 07/21/2015 Primary Physician: Madelyn Brunner, MD Primary Cardiologist:None  Chief Complaint:  Chief Complaint  Patient presents with  . Dizziness   Reason for Consult: atrial fibrillation with rapid ventricular rate  HPI: 81 y.o. male with known essential hypertension and chronic obstructive pulmonary disease with the new onset of dizziness weakness palpitations irregular heart beat over the last several days. The patient has been weak but not had any syncopal episodes chest pain or congestive heart failure type symptoms. When seen in the emergency room he had an EKG showing atrial fibrillation with rapid ventricular rate. There was no evidence of myocardial infarction with normal troponin. The patient did have hypomagnesemia which was replaced. After given appropriate medication management for heart rate control the patient spontaneously converted to normal sinus rhythm and is currently feeling well. She does not O contraindication to anticoagulation at this time and should be on anticoagulation as well  Past Medical History  Diagnosis Date  . Essential hypertension   . GERD (gastroesophageal reflux disease)   . Simple chronic bronchitis ([place])   . Erectile dysfunction   . BPH (benign prostatic hyperplasia)   . COPD (chronic obstructive pulmonary disease) [facility])       Surgical History:  Past Surgical History  Procedure Laterality Date  . Inguinal hernia repair       Home Meds: Prior to Admission medications   Medication Sig Start Date End Date Taking? Authorizing Provider  amLODipine (NORVASC) 5 MG tablet Take 1 tablet (5 mg total) by mouth daily. 02/20/15  Yes Richard Leslye Peer, MD  omeprazole (PRILOSEC) 20 MG capsule Take 20 mg by mouth daily.   Yes Historical Provider, MD  tamsulosin (FLOMAX) 0.4 MG CAPS capsule Take  0.4 mg by mouth daily. 07/19/15  Yes Historical Provider, MD  magnesium oxide (MAG-OX) 400 (241.3 Mg) MG tablet Take 1 tablet (400 mg total) by mouth daily. Patient not taking: Reported on 07/20/2015 02/20/15   Loletha Grayer, MD    Inpatient Medications:  . aspirin EC  81 mg Oral Daily  . enoxaparin (LOVENOX) injection  40 mg Subcutaneous Q24H  . folic acid  1 mg Oral Daily  . LORazepam  0-4 mg Oral Q6H   Followed by  . [START ON 07/22/2015] LORazepam  0-4 mg Oral Q12H  . magnesium oxide  400 mg Oral Daily  . metoprolol tartrate  25 mg Oral BID  . multivitamin with minerals  1 tablet Oral Daily  . pantoprazole  40 mg Oral Daily  . sodium chloride flush  3 mL Intravenous Q12H  . sodium chloride flush  3 mL Intravenous Q12H  . tamsulosin  0.4 mg Oral Daily  . thiamine  100 mg Oral Daily   Or  . thiamine  100 mg Intravenous Daily      Allergies: No Known Allergies  Social History   Social History  . Marital Status: Single    Spouse Name: N/A  . Number of Children: N/A  . Years of Education: N/A   Occupational History  . Not on file.   Social History Main Topics  . Smoking status: Current Every Day Smoker -- 1.00 packs/day    Types: Cigarettes  . Smokeless tobacco: Not on file  . Alcohol Use: 0.0 oz/week    0 Standard drinks or equivalent per week  . Drug Use: Not on  file  . Sexual Activity: Not on file   Other Topics Concern  . Not on file   Social History Narrative     Family History  Problem Relation Age of Onset  . Cancer Mother      Review of Systems Positive forWeakness fatigue get irregular heart beat dizziness Negative for: General:  chills, fever, night sweats or weight changes.  Cardiovascular: PND orthopnea syncope   Dermatological skin lesions rashes Respiratory: Cough congestion Urologic: Frequent urination urination at night and hematuria Abdominal: negative for nausea, vomiting, diarrhea, bright red blood per rectum, melena, or  hematemesis Neurologic: negative for visual changes, and/or hearing changes  All other systems reviewed and are otherwise negative except as noted above.  Labs:  Recent Labs  07/20/15 1235 07/20/15 1815 07/21/15 0001  TROPONINI <0.03 0.03 <0.03   Lab Results  Component Value Date   WBC 12.1* 07/21/2015   HGB 11.3* 07/21/2015   HCT 34.0* 07/21/2015   MCV 88.0 07/21/2015   PLT 338 07/21/2015    Recent Labs Lab 07/20/15 1235  NA 129*  K 3.0*  CL 92*  CO2 21*  BUN 9  CREATININE 0.81  CALCIUM 6.7*  PROT 6.7  BILITOT 0.8  ALKPHOS 146*  ALT 42  AST 37  GLUCOSE 152*   No results found for: CHOL, HDL, LDLCALC, TRIG No results found for: DDIMER  Radiology/Studies:  Dg Chest 2 View  07/20/2015  CLINICAL DATA:  Abnormal breath sounds left base. Dizziness. Cardiac arrhythmia. EXAM: CHEST  2 VIEW COMPARISON:  February 18, 2015 FINDINGS: There is no edema or consolidation. The heart size and pulmonary vascularity are normal. No adenopathy. There is mild degenerative change in the thoracic spine. IMPRESSION: No edema or consolidation. Electronically Signed   By: Lowella Grip III M.D.   On: 07/20/2015 13:56    [id number] fibrillation with rapid ventricular rate and nonspecific ST and T-wave changes  Weights: Filed Weights   07/20/15 1239 07/20/15 1700 07/21/15 0511  Weight: 146 lb (66.225 kg) 144 lb 8 oz (65.545 kg) 134 lb 3.2 oz (60.873 kg)     Physical Exam: Blood pressure 122/59, pulse 73, temperature 98 F (36.7 C), temperature source Oral, resp. rate 16, height 5\' 10"  (1.778 m), weight 134 lb 3.2 oz (60.873 kg), SpO2 90 %. Body mass index is 19.26 kg/(m^2). General: Well developed, well nourished, in no acute distress. Head eyes ears nose throat: Normocephalic, atraumatic, sclera non-icteric, no xanthomas, nares are without discharge. No apparent thyromegaly and/or mass  Lungs: Normal respiratory effort.  no wheezes, no rales, no rhonchi.  Heart: RRR with normal  S1 S2. no murmur gallop, no rub, PMI is normal size and placement, carotid upstroke normal without bruit, jugular venous pressure is normal Abdomen: Soft, non-tender, non-distended with normoactive bowel sounds. No hepatomegaly. No rebound/guarding. No obvious abdominal masses. Abdominal aorta is normal size without bruit Extremities: No edema. no cyanosis, no clubbing, no ulcers  Peripheral : 2+ bilateral upper extremity pulses, 2+ bilateral femoral pulses, 2+ bilateral dorsal pedal pulse Neuro: Alert and oriented. No facial asymmetry. No focal deficit. Moves all extremities spontaneously. Musculoskeletal: Normal muscle tone without kyphosis Psych:  Responds to questions appropriately with a normal affect.    Assessment: 81 year old male with essential hypertension and new onset atrial fibrillation with rapid ventricular rate with multiple symptoms and no current evidence of myocardial infarction and or congestive heart failure  Plan: Continue metoprolol which has helped him with spontaneous conversion to normal sinus rhythm and now  keeping in normal rhythm with appropriate blood pressure and heart rate control 2. Anticoagulation with eliquis 5 mg twice per day for further risk reduction in stroke with atrial fibrillation until further evaluation length of amount of anticoagulation needed 3. Consider outpatient echocardiogram and or other workup as necessary 4. Okay for discharge home today if ambulating well on appropriate medication management and tolerating well  Signed, Corey Skains M.D. [facility] 07/21/2015, 8:10 AM

## 2015-07-21 NOTE — Progress Notes (Signed)
Patient being discharge home as per MD order, denies any pain at this time, vss, mood calm, discharge instruction provided, iv removed, tele removed . Patient discharged home as per order

## 2015-07-21 NOTE — Care Management (Signed)
Patient admitted with new onset atrial fib which converted  to sinus rhythm.  It is thought his symptoms are related to excessive consumption of beer which cause his magnesium to be critical.  Patient denies need for home health follow up  He is to  discharge home on new Eliquis.  Provided him prescription.  he confirms that he does have medicare D coverage through his health [employer].  Provided him with 30 day coupon and instructed on use.

## 2015-07-21 NOTE — Discharge Instructions (Signed)
°  DIET:  Cardiac diet  DISCHARGE CONDITION:  Stable  ACTIVITY:  Activity as tolerated  OXYGEN:  Home Oxygen: No.   Oxygen Delivery: room air  DISCHARGE LOCATION:  home   If you experience worsening of your admission symptoms, develop shortness of breath, life threatening emergency, suicidal or homicidal thoughts you must seek medical attention immediately by calling 911 or calling your MD immediately  if symptoms less severe.  You Must read complete instructions/literature along with all the possible adverse reactions/side effects for all the Medicines you take and that have been prescribed to you. Take any new Medicines after you have completely understood and accpet all the possible adverse reactions/side effects.   Please note  You were cared for by a hospitalist during your hospital stay. If you have any questions about your discharge medications or the care you received while you were in the hospital after you are discharged, you can call the unit and asked to speak with the hospitalist on call if the hospitalist that took care of you is not available. Once you are discharged, your primary care physician will handle any further medical issues. Please note that NO REFILLS for any discharge medications will be authorized once you are discharged, as it is imperative that you return to your primary care physician (or establish a relationship with a primary care physician if you do not have one) for your aftercare needs so that they can reassess your need for medications and monitor your lab values.   QUIT BEER/ALCOHOL

## 2015-07-22 LAB — URINE CULTURE: SPECIAL REQUESTS: NORMAL

## 2015-07-24 NOTE — Discharge Summary (Signed)
[facility] at [place] NAME: Daniel Edwards    MR#:  [id number]  DATE OF BIRTH:  [birth year]  DATE OF ADMISSION:  07/20/2015 ADMITTING PHYSICIAN: Hillary Bow, MD  DATE OF DISCHARGE: 07/21/2015  1:01 PM  PRIMARY CARE PHYSICIAN: Madelyn Brunner, MD   ADMISSION DIAGNOSIS:  Hypomagnesemia [E83.42] CAP (community acquired pneumonia) [J18.9] Generalized weakness [R53.1]  DISCHARGE DIAGNOSIS:  Active Problems:   A-fib ([place])   SECONDARY DIAGNOSIS:   Past Medical History  Diagnosis Date  . Essential hypertension   . GERD (gastroesophageal reflux disease)   . Simple chronic bronchitis ([place])   . Erectile dysfunction   . BPH (benign prostatic hyperplasia)   . COPD (chronic obstructive pulmonary disease) ([place])      ADMITTING HISTORY  Daniel Edwards is a 81 y.o. male with a known history of Hypertension, COPD, alcohol abuse present to the emergency room complaining of 2 days of dizziness. He was found to be in atrial fibrillation. Initially fluids given with which his tachycardia improved. Presently in normal sinus rhythm. Patient has been found to have severe hypomagnesemia of 0.3. He has had low magnesium levels of 0.6 in the past on admission. He drinks 5-6 cans of beer every day for many years. Continues to smoke. He has no shortness of breath or chest pain or vomiting or abdominal pain or diarrhea. No recent change in medications. No history of atrial fibrillation or recent change in medications.   HOSPITAL COURSE:   * Severe hypomagnesemia This is due to excessive beer drinking. Replaced aggressively through IV and oral. Normal at time of discharge. Prescription for outpatient magnesium oxide given.  * Paroxysmal atrial fibrillation This is likely due to hypomagnesemia. Now resolved. Patient has PACs. Had echocardiogram earlier this year which showed nothing acute or significant. Ejection fraction was 55%. TSH levels  Normal Started metoprolol 25 twice a day  Patient seen by cardiology. Advised starting on Eliquis. Prescription given. Follow up with PCP and cardiology as outpatient.  * COPD is stable. Continue inhalers.  * Hypertension Stop Norvasc as patient is being started on metoprolol.  * BPH. Continue Flomax.  * Alcohol abuse Counseled to quit.  No withdrawals in the hospital  * Mild hyponatremia likely due to alcohol.  * DVT prophylaxis with Lovenox.  CONSULTS OBTAINED:  Treatment Team:  Corey Skains, MD  DRUG ALLERGIES:  No Known Allergies  DISCHARGE MEDICATIONS:   Discharge Medication List as of 07/21/2015 11:11 AM    START taking these medications   Details  apixaban (ELIQUIS) 5 MG TABS tablet Take 1 tablet (5 mg total) by mouth 2 (two) times daily., Starting 07/21/2015, Until Discontinued, Normal    metoprolol tartrate (LOPRESSOR) 25 MG tablet Take 1 tablet (25 mg total) by mouth 2 (two) times daily., Starting 07/21/2015, Until Discontinued, Normal      CONTINUE these medications which have CHANGED   Details  magnesium oxide (MAG-OX) 400 (241.3 Mg) MG tablet Take 1 tablet (400 mg total) by mouth 2 (two) times daily., Starting 07/21/2015, Until Discontinued, Normal      CONTINUE these medications which have NOT CHANGED   Details  omeprazole (PRILOSEC) 20 MG capsule Take 20 mg by mouth daily., Until Discontinued, Historical Med    tamsulosin (FLOMAX) 0.4 MG CAPS capsule Take 0.4 mg by mouth daily., Starting 07/19/2015, Until Discontinued, Historical Med      STOP taking these medications     amLODipine (NORVASC) 5 MG tablet  Today   VITAL SIGNS:  Blood pressure 118/84, pulse 72, temperature 98.1 F (36.7 C), temperature source Oral, resp. rate 18, height 5\' 10"  (1.778 m), weight 60.873 kg (134 lb 3.2 oz), SpO2 91 %.  I/O:  No intake or output data in the 24 hours ending 07/24/15 1229  PHYSICAL EXAMINATION:  Physical Exam  GENERAL:  81  y.o.-year-old patient lying in the bed with no acute distress.  LUNGS: Normal breath sounds bilaterally, no wheezing, rales,rhonchi or crepitation. No use of accessory muscles of respiration.  CARDIOVASCULAR: S1, S2 normal. No murmurs, rubs, or gallops.  ABDOMEN: Soft, non-tender, non-distended. Bowel sounds present. No organomegaly or mass.  NEUROLOGIC: Moves all 4 extremities. PSYCHIATRIC: The patient is alert and oriented x 3.  SKIN: No obvious rash, lesion, or ulcer.   DATA REVIEW:   CBC  Recent Labs Lab 07/21/15 0644  WBC 12.1*  HGB 11.3*  HCT 34.0*  PLT 338    Chemistries   Recent Labs Lab 07/20/15 1235 07/21/15 0644  NA 129* 137  K 3.0* 3.8  CL 92* 105  CO2 21* 22  GLUCOSE 152* 162*  BUN 9 9  CREATININE 0.81 0.90  CALCIUM 6.7* 6.6*  MG 0.3* 1.8  AST 37  --   ALT 42  --   ALKPHOS 146*  --   BILITOT 0.8  --     Cardiac Enzymes  Recent Labs Lab 07/21/15 0001  TROPONINI <0.03    Microbiology Results  Results for orders placed or performed during the hospital encounter of 07/20/15  Blood Culture (routine x 2)     Status: None (Preliminary result)   Collection Time: 07/20/15  1:37 PM  Result Value Ref Range Status   Specimen Description BLOOD RFA  Final   Special Requests   Final    BOTTLES DRAWN AEROBIC AND ANAEROBIC AER 2ML ANA 1ML   Culture NO GROWTH 4 DAYS  Final   Report Status PENDING  Incomplete  Urine culture     Status: Abnormal   Collection Time: 07/20/15  6:27 PM  Result Value Ref Range Status   Specimen Description URINE, RANDOM  Final   Special Requests Normal  Final   Culture (A)  Final    <10,000 COLONIES/mL INSIGNIFICANT GROWTH Performed at [facility]    Report Status 07/22/2015 FINAL  Final    RADIOLOGY:  No results found.  Follow up with PCP in 1 week.  Management plans discussed with the patient, family and they are in agreement.  CODE STATUS:  Code Status History    Date Active Date Inactive Code Status  Order ID Comments User Context   07/20/2015  3:12 PM 07/21/2015  4:01 PM Full Code [id number]  Hillary Bow, MD ED   02/19/2015  7:24 AM 02/20/2015  5:57 PM Full Code [id number]  Harrie Foreman, MD Inpatient      TOTAL TIME TAKING CARE OF THIS PATIENT ON DAY OF DISCHARGE: more than 30 minutes.   Hillary Bow R M.D on 07/24/2015 at 12:29 PM  Between 7am to 6pm - Pager - [phone]  After 6pm go to [email or web address] - password [email or web address] [facility]  Office  [phone]  CC: Primary care physician; Madelyn Brunner, MD  Note: This dictation was prepared with Dragon dictation along with smaller phrase technology. Any transcriptional errors that result from this process are unintentional.

## 2015-07-28 LAB — CULTURE, BLOOD (ROUTINE X 2): CULTURE: NO GROWTH

## 2015-08-05 DIAGNOSIS — I482 Chronic atrial fibrillation: Secondary | ICD-10-CM | POA: Diagnosis not present

## 2015-09-23 LAB — CULTURE, BLOOD (ROUTINE X 2): Culture: NO GROWTH

## 2016-02-04 DIAGNOSIS — J410 Simple chronic bronchitis: Secondary | ICD-10-CM | POA: Diagnosis not present

## 2016-02-04 DIAGNOSIS — Z720 Tobacco use: Secondary | ICD-10-CM | POA: Diagnosis not present

## 2016-02-04 DIAGNOSIS — I482 Chronic atrial fibrillation: Secondary | ICD-10-CM | POA: Diagnosis not present

## 2016-02-04 DIAGNOSIS — Z0000 Encounter for general adult medical examination without abnormal findings: Secondary | ICD-10-CM | POA: Diagnosis not present

## 2016-02-04 DIAGNOSIS — I10 Essential (primary) hypertension: Secondary | ICD-10-CM | POA: Diagnosis not present

## 2016-08-04 DIAGNOSIS — Z0001 Encounter for general adult medical examination with abnormal findings: Secondary | ICD-10-CM | POA: Diagnosis not present

## 2016-08-04 DIAGNOSIS — J410 Simple chronic bronchitis: Secondary | ICD-10-CM | POA: Diagnosis not present

## 2016-08-04 DIAGNOSIS — I10 Essential (primary) hypertension: Secondary | ICD-10-CM | POA: Diagnosis not present

## 2016-08-04 DIAGNOSIS — I482 Chronic atrial fibrillation: Secondary | ICD-10-CM | POA: Diagnosis not present

## 2016-08-04 DIAGNOSIS — Z720 Tobacco use: Secondary | ICD-10-CM | POA: Diagnosis not present

## 2016-08-05 DIAGNOSIS — I10 Essential (primary) hypertension: Secondary | ICD-10-CM | POA: Diagnosis not present

## 2018-05-15 DIAGNOSIS — J0100 Acute maxillary sinusitis, unspecified: Secondary | ICD-10-CM | POA: Diagnosis not present

## 2018-05-15 DIAGNOSIS — I10 Essential (primary) hypertension: Secondary | ICD-10-CM | POA: Diagnosis not present

## 2018-09-21 DIAGNOSIS — J069 Acute upper respiratory infection, unspecified: Secondary | ICD-10-CM | POA: Diagnosis not present

## 2018-11-15 ENCOUNTER — Emergency Department: Payer: Medicare HMO

## 2018-11-15 ENCOUNTER — Inpatient Hospital Stay
Admission: EM | Admit: 2018-11-15 | Discharge: 2018-11-21 | DRG: 557 | Disposition: A | Discharge status: Skilled Nursing Facility | Payer: Medicare HMO | Attending: Internal Medicine | Admitting: Internal Medicine

## 2018-11-15 ENCOUNTER — Other Ambulatory Visit: Payer: Self-pay

## 2018-11-15 DIAGNOSIS — Y92009 Unspecified place in unspecified non-institutional (private) residence as the place of occurrence of the external cause: Secondary | ICD-10-CM | POA: Diagnosis not present

## 2018-11-15 DIAGNOSIS — I480 Paroxysmal atrial fibrillation: Secondary | ICD-10-CM | POA: Diagnosis present

## 2018-11-15 DIAGNOSIS — R42 Dizziness and giddiness: Secondary | ICD-10-CM | POA: Diagnosis not present

## 2018-11-15 DIAGNOSIS — S4991XA Unspecified injury of right shoulder and upper arm, initial encounter: Secondary | ICD-10-CM | POA: Diagnosis not present

## 2018-11-15 DIAGNOSIS — Z20828 Contact with and (suspected) exposure to other viral communicable diseases: Secondary | ICD-10-CM | POA: Diagnosis present

## 2018-11-15 DIAGNOSIS — I5031 Acute diastolic (congestive) heart failure: Secondary | ICD-10-CM | POA: Diagnosis not present

## 2018-11-15 DIAGNOSIS — N401 Enlarged prostate with lower urinary tract symptoms: Secondary | ICD-10-CM | POA: Diagnosis present

## 2018-11-15 DIAGNOSIS — S59911A Unspecified injury of right forearm, initial encounter: Secondary | ICD-10-CM | POA: Diagnosis not present

## 2018-11-15 DIAGNOSIS — J90 Pleural effusion, not elsewhere classified: Secondary | ICD-10-CM | POA: Diagnosis not present

## 2018-11-15 DIAGNOSIS — D72829 Elevated white blood cell count, unspecified: Secondary | ICD-10-CM

## 2018-11-15 DIAGNOSIS — Z720 Tobacco use: Secondary | ICD-10-CM | POA: Diagnosis not present

## 2018-11-15 DIAGNOSIS — E876 Hypokalemia: Secondary | ICD-10-CM | POA: Diagnosis present

## 2018-11-15 DIAGNOSIS — Z79899 Other long term (current) drug therapy: Secondary | ICD-10-CM | POA: Diagnosis not present

## 2018-11-15 DIAGNOSIS — R197 Diarrhea, unspecified: Secondary | ICD-10-CM | POA: Diagnosis not present

## 2018-11-15 DIAGNOSIS — W19XXXA Unspecified fall, initial encounter: Secondary | ICD-10-CM | POA: Diagnosis present

## 2018-11-15 DIAGNOSIS — S41112A Laceration without foreign body of left upper arm, initial encounter: Secondary | ICD-10-CM | POA: Diagnosis not present

## 2018-11-15 DIAGNOSIS — S59902A Unspecified injury of left elbow, initial encounter: Secondary | ICD-10-CM | POA: Diagnosis not present

## 2018-11-15 DIAGNOSIS — K219 Gastro-esophageal reflux disease without esophagitis: Secondary | ICD-10-CM | POA: Diagnosis present

## 2018-11-15 DIAGNOSIS — F17210 Nicotine dependence, cigarettes, uncomplicated: Secondary | ICD-10-CM | POA: Diagnosis not present

## 2018-11-15 DIAGNOSIS — J449 Chronic obstructive pulmonary disease, unspecified: Secondary | ICD-10-CM | POA: Diagnosis not present

## 2018-11-15 DIAGNOSIS — R531 Weakness: Secondary | ICD-10-CM | POA: Diagnosis not present

## 2018-11-15 DIAGNOSIS — M6282 Rhabdomyolysis: Secondary | ICD-10-CM | POA: Diagnosis not present

## 2018-11-15 DIAGNOSIS — R0902 Hypoxemia: Secondary | ICD-10-CM

## 2018-11-15 DIAGNOSIS — T796XXD Traumatic ischemia of muscle, subsequent encounter: Secondary | ICD-10-CM | POA: Diagnosis not present

## 2018-11-15 DIAGNOSIS — M25511 Pain in right shoulder: Secondary | ICD-10-CM | POA: Diagnosis not present

## 2018-11-15 DIAGNOSIS — M6281 Muscle weakness (generalized): Secondary | ICD-10-CM | POA: Diagnosis not present

## 2018-11-15 DIAGNOSIS — J410 Simple chronic bronchitis: Secondary | ICD-10-CM | POA: Diagnosis not present

## 2018-11-15 DIAGNOSIS — M2550 Pain in unspecified joint: Secondary | ICD-10-CM | POA: Diagnosis not present

## 2018-11-15 DIAGNOSIS — M25521 Pain in right elbow: Secondary | ICD-10-CM | POA: Diagnosis not present

## 2018-11-15 DIAGNOSIS — S59901A Unspecified injury of right elbow, initial encounter: Secondary | ICD-10-CM | POA: Diagnosis not present

## 2018-11-15 DIAGNOSIS — S41111A Laceration without foreign body of right upper arm, initial encounter: Secondary | ICD-10-CM | POA: Diagnosis not present

## 2018-11-15 DIAGNOSIS — S8992XA Unspecified injury of left lower leg, initial encounter: Secondary | ICD-10-CM | POA: Diagnosis not present

## 2018-11-15 DIAGNOSIS — S0990XA Unspecified injury of head, initial encounter: Secondary | ICD-10-CM | POA: Diagnosis not present

## 2018-11-15 DIAGNOSIS — I110 Hypertensive heart disease with heart failure: Secondary | ICD-10-CM | POA: Diagnosis present

## 2018-11-15 DIAGNOSIS — N39498 Other specified urinary incontinence: Secondary | ICD-10-CM | POA: Diagnosis present

## 2018-11-15 DIAGNOSIS — M79631 Pain in right forearm: Secondary | ICD-10-CM | POA: Diagnosis not present

## 2018-11-15 DIAGNOSIS — R778 Other specified abnormalities of plasma proteins: Secondary | ICD-10-CM | POA: Diagnosis not present

## 2018-11-15 DIAGNOSIS — Z03818 Encounter for observation for suspected exposure to other biological agents ruled out: Secondary | ICD-10-CM | POA: Diagnosis not present

## 2018-11-15 DIAGNOSIS — R748 Abnormal levels of other serum enzymes: Secondary | ICD-10-CM

## 2018-11-15 DIAGNOSIS — Z7901 Long term (current) use of anticoagulants: Secondary | ICD-10-CM

## 2018-11-15 DIAGNOSIS — S6991XA Unspecified injury of right wrist, hand and finger(s), initial encounter: Secondary | ICD-10-CM | POA: Diagnosis not present

## 2018-11-15 DIAGNOSIS — S199XXA Unspecified injury of neck, initial encounter: Secondary | ICD-10-CM | POA: Diagnosis not present

## 2018-11-15 DIAGNOSIS — Z7401 Bed confinement status: Secondary | ICD-10-CM | POA: Diagnosis not present

## 2018-11-15 DIAGNOSIS — M79641 Pain in right hand: Secondary | ICD-10-CM | POA: Diagnosis not present

## 2018-11-15 DIAGNOSIS — I10 Essential (primary) hypertension: Secondary | ICD-10-CM | POA: Diagnosis not present

## 2018-11-15 LAB — CBC WITH DIFFERENTIAL/PLATELET
Abs Immature Granulocytes: 0.2 10*3/uL — ABNORMAL HIGH (ref 0.00–0.07)
Basophils Absolute: 0 10*3/uL (ref 0.0–0.1)
Basophils Relative: 0 %
Eosinophils Absolute: 0 10*3/uL (ref 0.0–0.5)
Eosinophils Relative: 0 %
HCT: 40.8 % (ref 39.0–52.0)
Hemoglobin: 14.3 g/dL (ref 13.0–17.0)
Immature Granulocytes: 1 %
Lymphocytes Relative: 2 %
Lymphs Abs: 0.4 10*3/uL — ABNORMAL LOW (ref 0.7–4.0)
MCH: 30.6 pg (ref 26.0–34.0)
MCHC: 35 g/dL (ref 30.0–36.0)
MCV: 87.2 fL (ref 80.0–100.0)
Monocytes Absolute: 1.5 10*3/uL — ABNORMAL HIGH (ref 0.1–1.0)
Monocytes Relative: 7 %
Neutro Abs: 20.2 10*3/uL — ABNORMAL HIGH (ref 1.7–7.7)
Neutrophils Relative %: 90 %
Platelets: 286 10*3/uL (ref 150–400)
RBC: 4.68 MIL/uL (ref 4.22–5.81)
RDW: 12.8 % (ref 11.5–15.5)
WBC: 22.2 10*3/uL — ABNORMAL HIGH (ref 4.0–10.5)
nRBC: 0 % (ref 0.0–0.2)

## 2018-11-15 LAB — COMPREHENSIVE METABOLIC PANEL
ALT: 26 U/L (ref 0–44)
AST: 97 U/L — ABNORMAL HIGH (ref 15–41)
Albumin: 3.7 g/dL (ref 3.5–5.0)
Alkaline Phosphatase: 63 U/L (ref 38–126)
Anion gap: 17 — ABNORMAL HIGH (ref 5–15)
BUN: 18 mg/dL (ref 8–23)
CO2: 25 mmol/L (ref 22–32)
Calcium: 6.7 mg/dL — ABNORMAL LOW (ref 8.9–10.3)
Chloride: 93 mmol/L — ABNORMAL LOW (ref 98–111)
Creatinine, Ser: 1.03 mg/dL (ref 0.61–1.24)
GFR calc Af Amer: >60 mL/min (ref >60)
GFR calc non Af Amer: >60 mL/min (ref >60)
Glucose, Bld: 101 mg/dL — ABNORMAL HIGH (ref 70–99)
Potassium: 2.9 mmol/L — ABNORMAL LOW (ref 3.5–5.1)
Sodium: 135 mmol/L (ref 135–145)
Total Bilirubin: 1.3 mg/dL — ABNORMAL HIGH (ref 0.3–1.2)
Total Protein: 6.6 g/dL (ref 6.5–8.1)

## 2018-11-15 LAB — SARS CORONAVIRUS 2 BY RT PCR (HOSPITAL ORDER, PERFORMED IN ~~LOC~~ HOSPITAL LAB): SARS Coronavirus 2: NEGATIVE

## 2018-11-15 LAB — TROPONIN I (HIGH SENSITIVITY)
Troponin I (High Sensitivity): 263 ng/L (ref <18)
Troponin I (High Sensitivity): 234 ng/L (ref <18)
Troponin I (High Sensitivity): 227 ng/L (ref <18)

## 2018-11-15 LAB — CK: Total CK: 3695 U/L — ABNORMAL HIGH (ref 49–397)

## 2018-11-15 LAB — MAGNESIUM: Magnesium: 0.7 mg/dL — CL (ref 1.7–2.4)

## 2018-11-15 MED ORDER — SODIUM CHLORIDE 0.9 % IV SOLN
INTRAVENOUS | Status: DC
  Administered 2018-11-15 – 2018-11-18 (×5): via INTRAVENOUS

## 2018-11-15 MED ORDER — METOPROLOL TARTRATE 25 MG PO TABS
25.0000 mg | ORAL_TABLET | Freq: Two times a day (BID) | ORAL | Status: DC
  Administered 2018-11-16 – 2018-11-21 (×11): 25 mg via ORAL
  Filled 2018-11-15 (×12): qty 1

## 2018-11-15 MED ORDER — SENNOSIDES-DOCUSATE SODIUM 8.6-50 MG PO TABS: 1.0000 | ORAL_TABLET | Freq: Every evening | ORAL | Status: DC | PRN

## 2018-11-15 MED ORDER — HYDROCODONE-ACETAMINOPHEN 5-325 MG PO TABS
1.0000 | ORAL_TABLET | ORAL | Status: DC | PRN
  Administered 2018-11-17: 2 via ORAL
  Filled 2018-11-15: qty 2

## 2018-11-15 MED ORDER — SODIUM CHLORIDE 0.9 % IV BOLUS
500.0000 mL | Freq: Once | INTRAVENOUS | Status: AC
  Administered 2018-11-15: 14:00:00 500 mL via INTRAVENOUS

## 2018-11-15 MED ORDER — PANTOPRAZOLE SODIUM 40 MG PO TBEC
40.0000 mg | DELAYED_RELEASE_TABLET | Freq: Every day | ORAL | Status: DC
  Administered 2018-11-15 – 2018-11-21 (×7): 40 mg via ORAL
  Filled 2018-11-15 (×7): qty 1

## 2018-11-15 MED ORDER — ASPIRIN 81 MG PO CHEW
324.0000 mg | CHEWABLE_TABLET | Freq: Once | ORAL | Status: AC
  Administered 2018-11-15: 14:00:00 324 mg via ORAL
  Filled 2018-11-15: qty 4

## 2018-11-15 MED ORDER — TAMSULOSIN HCL 0.4 MG PO CAPS
0.4000 mg | ORAL_CAPSULE | Freq: Every day | ORAL | Status: DC
  Administered 2018-11-15 – 2018-11-21 (×7): 0.4 mg via ORAL
  Filled 2018-11-15 (×7): qty 1

## 2018-11-15 MED ORDER — MAGNESIUM SULFATE 4 GM/100ML IV SOLN
4.0000 g | Freq: Once | INTRAVENOUS | Status: AC
  Administered 2018-11-15: 18:00:00 4 g via INTRAVENOUS
  Filled 2018-11-15: qty 100

## 2018-11-15 MED ORDER — ENOXAPARIN SODIUM 40 MG/0.4ML ~~LOC~~ SOLN
40.0000 mg | SUBCUTANEOUS | Status: DC
  Administered 2018-11-16 – 2018-11-20 (×5): 40 mg via SUBCUTANEOUS
  Filled 2018-11-15 (×6): qty 0.4

## 2018-11-15 MED ORDER — ACETAMINOPHEN 650 MG RE SUPP: 650.0000 mg | Freq: Four times a day (QID) | RECTAL | Status: DC | PRN

## 2018-11-15 MED ORDER — ACETAMINOPHEN 325 MG PO TABS: 650.0000 mg | ORAL_TABLET | Freq: Four times a day (QID) | ORAL | Status: DC | PRN

## 2018-11-15 MED ORDER — ONDANSETRON HCL 4 MG PO TABS: 4.0000 mg | ORAL_TABLET | Freq: Four times a day (QID) | ORAL | Status: DC | PRN

## 2018-11-15 MED ORDER — NICOTINE 14 MG/24HR TD PT24
14.0000 mg | MEDICATED_PATCH | Freq: Every day | TRANSDERMAL | Status: DC
  Administered 2018-11-15 – 2018-11-21 (×6): 14 mg via TRANSDERMAL
  Filled 2018-11-15 (×8): qty 1

## 2018-11-15 MED ORDER — BISACODYL 5 MG PO TBEC: 5.0000 mg | DELAYED_RELEASE_TABLET | Freq: Every day | ORAL | Status: DC | PRN

## 2018-11-15 MED ORDER — SODIUM CHLORIDE 0.9 % IV BOLUS
500.0000 mL | Freq: Once | INTRAVENOUS | Status: AC
  Administered 2018-11-15: 13:00:00 500 mL via INTRAVENOUS

## 2018-11-15 MED ORDER — ALBUTEROL SULFATE (2.5 MG/3ML) 0.083% IN NEBU: 2.5000 mg | INHALATION_SOLUTION | RESPIRATORY_TRACT | Status: DC | PRN

## 2018-11-15 MED ORDER — POTASSIUM CHLORIDE CRYS ER 20 MEQ PO TBCR
40.0000 meq | EXTENDED_RELEASE_TABLET | ORAL | Status: AC
  Administered 2018-11-15 (×2): 40 meq via ORAL
  Filled 2018-11-15 (×2): qty 2

## 2018-11-15 MED ORDER — ONDANSETRON HCL 4 MG/2ML IJ SOLN: 4.0000 mg | Freq: Four times a day (QID) | INTRAMUSCULAR | Status: DC | PRN

## 2018-11-15 MED ORDER — MAGNESIUM OXIDE 400 (241.3 MG) MG PO TABS
400.0000 mg | ORAL_TABLET | Freq: Two times a day (BID) | ORAL | Status: DC
  Administered 2018-11-15 – 2018-11-18 (×6): 400 mg via ORAL
  Filled 2018-11-15 (×6): qty 1

## 2018-11-15 MED ORDER — ASPIRIN EC 325 MG PO TBEC
325.0000 mg | DELAYED_RELEASE_TABLET | Freq: Every day | ORAL | Status: DC
  Administered 2018-11-16 – 2018-11-21 (×6): 325 mg via ORAL
  Filled 2018-11-15 (×6): qty 1

## 2018-11-15 NOTE — Progress Notes (Signed)
Mag 0.7. MD notified new order received

## 2018-11-15 NOTE — H&P (Addendum)
[facility] [facility] at [place] NAME: Daniel Edwards    MR#:  [id number]  DATE OF BIRTH:  04/16/1934  DATE OF ADMISSION:  11/15/2018  PRIMARY CARE PHYSICIAN: Patient, No Pcp Per   REQUESTING/REFERRING PHYSICIAN:Dr. Monks.  CHIEF COMPLAINT:   Chief Complaint  Patient presents with  . Fall   Fall at home. HISTORY OF PRESENT ILLNESS:  Oron Poppen  is a 84 y.o. male with a known history of hypertension, COPD, BPH, GERD, and chronic bronchitis.  The patient needs to be weaned along himself.  He felt dizzy and fell at home last night.  Back and head his head.  He denies any syncope or loss consciousness or focal weakness.  He complains of bilateral leg pain.  His stay on the floor for about 12 hours until policeman came to his home.  His CK is elevated at 3695, troponin is elevated at 263.  Potassium is 2.9.  WBC 22.2.  Of the head and cervical spine did not show any fracture or dislocation or hemorrhage. PAST MEDICAL HISTORY:   Past Medical History:  Diagnosis Date  . BPH (benign prostatic hyperplasia)   . COPD (chronic obstructive pulmonary disease) ([place])   . Erectile dysfunction   . Essential hypertension   . GERD (gastroesophageal reflux disease)   . Simple chronic bronchitis ([place])     PAST SURGICAL HISTORY:   Past Surgical History:  Procedure Laterality Date  . INGUINAL HERNIA REPAIR      SOCIAL HISTORY:   Social History   Tobacco Use  . Smoking status: Current Every Day Smoker    Packs/day: 1.00    Types: Cigarettes  Substance Use Topics  . Alcohol use: Yes    Alcohol/week: 0.0 standard drinks    FAMILY HISTORY:   Family History  Problem Relation Age of Onset  . Cancer Mother     DRUG ALLERGIES:  No Known Allergies  REVIEW OF SYSTEMS:   Review of Systems  Constitutional: Positive for malaise/fatigue. Negative for chills and fever.  HENT: Negative for sore throat.   Eyes: Negative for blurred vision and double  vision.  Respiratory: Negative for cough, hemoptysis, shortness of breath, wheezing and stridor.   Cardiovascular: Negative for chest pain, palpitations, orthopnea and leg swelling.  Gastrointestinal: Negative for abdominal pain, blood in stool, diarrhea, melena, nausea and vomiting.  Genitourinary: Negative for dysuria, flank pain and hematuria.  Musculoskeletal: Negative for back pain and joint pain.       Bilateral leg pain.  Skin: Negative for rash.  Neurological: Positive for dizziness. Negative for sensory change, focal weakness, seizures, loss of consciousness, weakness and headaches.  Endo/Heme/Allergies: Negative for polydipsia.  Psychiatric/Behavioral: Negative for depression. The patient is not nervous/anxious.     MEDICATIONS AT HOME:   Prior to Admission medications   Medication Sig Start Date End Date Taking? Authorizing Provider  apixaban (ELIQUIS) 5 MG TABS tablet Take 1 tablet (5 mg total) by mouth 2 (two) times daily. 07/21/15   Hillary Bow, MD  magnesium oxide (MAG-OX) 400 (241.3 Mg) MG tablet Take 1 tablet (400 mg total) by mouth 2 (two) times daily. 07/21/15   Hillary Bow, MD  metoprolol tartrate (LOPRESSOR) 25 MG tablet Take 1 tablet (25 mg total) by mouth 2 (two) times daily. 07/21/15   Hillary Bow, MD  omeprazole (PRILOSEC) 20 MG capsule Take 20 mg by mouth daily.    [provider]  tamsulosin (FLOMAX) 0.4 MG CAPS capsule Take 0.4  mg by mouth daily. 07/19/15   [provider]      VITAL SIGNS:  Blood pressure 119/86, pulse 87, temperature 97.6 F (36.4 C), temperature source Oral, resp. rate 18, height 5\' 10"  (1.778 m), weight 63.5 kg, SpO2 94 %.  PHYSICAL EXAMINATION:  Physical Exam  GENERAL:  84 y.o.-year-old patient lying in the bed with no acute distress.  EYES: Pupils equal, round, reactive to light and accommodation. No scleral icterus. Extraocular muscles intact.  HEENT: Head atraumatic, normocephalic. NECK:  Supple, no jugular  venous distention. No thyroid enlargement, no tenderness.  LUNGS: Normal breath sounds bilaterally, no wheezing, rales,rhonchi or crepitation. No use of accessory muscles of respiration.  CARDIOVASCULAR: S1, S2 normal. No murmurs, rubs, or gallops.  ABDOMEN: Soft, nontender, nondistended. Bowel sounds present. No organomegaly or mass.  EXTREMITIES: No pedal edema, cyanosis, or clubbing.  Some bruises and skin tear on bilateral leg. NEUROLOGIC: Cranial nerves II through XII are intact. Muscle strength 4/5 in all extremities. Sensation intact. Gait not checked.  PSYCHIATRIC: The patient is alert and oriented x 2.  SKIN: No obvious rash, lesion, or ulcer.   LABORATORY PANEL:   CBC Recent Labs  Lab 11/15/18 1149  WBC 22.2*  HGB 14.3  HCT 40.8  PLT 286   ------------------------------------------------------------------------------------------------------------------  Chemistries  Recent Labs  Lab 11/15/18 1149  NA 135  K 2.9*  CL 93*  CO2 25  GLUCOSE 101*  BUN 18  CREATININE 1.03  CALCIUM 6.7*  AST 97*  ALT 26  ALKPHOS 63  BILITOT 1.3*   ------------------------------------------------------------------------------------------------------------------  Cardiac Enzymes No results for input(s): TROPONINI in the last 168 hours. ------------------------------------------------------------------------------------------------------------------  RADIOLOGY:  No results found.    IMPRESSION AND PLAN:   Rhabdomyolysis due to fall. The patient will be admitted to medical floor, IV fluid support and follow-up CK. Follow-up extremity x-rays.  Elevated troponin, possible due to rhabdomyolysis.  Continue aspirin and follow-up troponin level.  Echocardiograph.  Fall and weakness.  Fall precaution PT. Hypokalemia.  Potassium supplement, follow-up potassium level.  Follow magnesium level.  Leukocytosis.  Unclear etiology, follow-up urine analysis. Hypertension.  Blood pressure  is normal.  Continue Lopressor but hold Norvasc. COPD.  Stable.  Albuterol as needed. Tobacco abuse.  Smoking cessation was counseled for 3 to 4 minutes.  Nicotine patch.  All the records are reviewed and case discussed with ED provider. Management plans discussed with the patient, his son and they are in agreement.  CODE STATUS: Full code.  TOTAL TIME TAKING CARE OF THIS PATIENT: 55 minutes.    Demetrios Loll M.D on 11/15/2018 at 1:36 PM  Between 7am to 6pm - Pager - [phone]  After 6pm go to [email or web address] - Proofreader  [facility]  Office  [phone]  CC: Primary care physician; Patient, No Pcp Per   Note: This dictation was prepared with Dragon dictation along with smaller phrase technology. Any transcriptional errors that result from this process are unin

## 2018-11-15 NOTE — ED Notes (Signed)
Attempted to call report

## 2018-11-15 NOTE — ED Triage Notes (Addendum)
Pt to ED via [facility] from home for chief complaint of fall that occurred at 1130 last night, denies head injury or LOC. EMS reports that Police were sent to check on pt when family couldn't get in touch with pt, was found on the floor and had been lying in the floor for approx 12 hours. Pt appears alert and oriented x4. Skin tears on both elbows, abrasions on both legs, skin dry, mucous membranes dry. Appears in NAD. Family/son at bedside now.

## 2018-11-15 NOTE — ED Provider Notes (Signed)
[facility] Emergency Department Provider Note  ____________________________________________   First MD Initiated Contact with Patient 11/15/18 1135     (approximate)  I have reviewed the triage vital signs and the nursing notes.  History  Chief Complaint Fall    HPI Daniel Edwards is a 84 y.o. male with a history of COPD, hypertension, GERD, BPH who presents emergency department for a fall with prolonged downtime.  Patient states he was walking in his home last night when he began to feel lightheaded, causing him to fall back and hit his head.  He denies any associated loss of consciousness after the head injury.  However, he was unable to get up by himself, and therefore laid on the floor for approximately 12 hours until police came in for a well check.    He is alert and oriented on arrival.  He reports some mild pain to the right shoulder without radiation.  He has skin tears and scattered abrasions and ecchymosis to his extremities.  Mucous membranes are extremely dry.  He denies any recent illnesses, no recent fevers, chills, difficulty breathing, nausea, vomiting, diarrhea.   Past Medical Hx Past Medical History:  Diagnosis Date  . BPH (benign prostatic hyperplasia)   . COPD (chronic obstructive pulmonary disease) ([place])   . Erectile dysfunction   . Essential hypertension   . GERD (gastroesophageal reflux disease)   . Simple chronic bronchitis ([place])     Problem List Patient Active Problem List   Diagnosis Date Noted  . A-fib ([place]) 07/20/2015  . Chest pain 02/19/2015  . Atypical chest pain 02/19/2015    Past Surgical Hx Past Surgical History:  Procedure Laterality Date  . INGUINAL HERNIA REPAIR      Medications Prior to Admission medications   Medication Sig Start Date End Date Taking? Authorizing Provider  apixaban (ELIQUIS) 5 MG TABS tablet Take 1 tablet (5 mg total) by mouth 2 (two) times daily. 07/21/15   Hillary Bow, MD   magnesium oxide (MAG-OX) 400 (241.3 Mg) MG tablet Take 1 tablet (400 mg total) by mouth 2 (two) times daily. 07/21/15   Hillary Bow, MD  metoprolol tartrate (LOPRESSOR) 25 MG tablet Take 1 tablet (25 mg total) by mouth 2 (two) times daily. 07/21/15   Hillary Bow, MD  omeprazole (PRILOSEC) 20 MG capsule Take 20 mg by mouth daily.    [provider]  tamsulosin (FLOMAX) 0.4 MG CAPS capsule Take 0.4 mg by mouth daily. 07/19/15   [provider]    Allergies Patient has no known allergies.  Family Hx Family History  Problem Relation Age of Onset  . Cancer Mother     Social Hx Social History   Tobacco Use  . Smoking status: Current Every Day Smoker    Packs/day: 1.00    Types: Cigarettes  Substance Use Topics  . Alcohol use: Yes    Alcohol/week: 0.0 standard drinks  . Drug use: Not on file     Review of Systems  Constitutional: Negative for fever, chills. Eyes: Negative for visual changes. ENT: Negative for sore throat. Cardiovascular: Negative for chest pain. Respiratory: Negative for shortness of breath. Gastrointestinal: Negative for nausea, vomiting.  Genitourinary: Negative for dysuria. Musculoskeletal: Negative for leg swelling. Skin: + Skin tears, ecchymosis Neurological: Negative for for headaches.   Physical Exam  Vital Signs: ED Triage Vitals  Enc Vitals Group     BP 11/15/18 1134 119/86     Pulse Rate 11/15/18 1137 (!) 105  Resp 11/15/18 1137 17     Temp 11/15/18 1137 97.6 F (36.4 C)     Temp Source 11/15/18 1137 Oral     SpO2 11/15/18 1126 96 %     Weight 11/15/18 1129 140 lb (63.5 kg)     Height 11/15/18 1129 5\' 10"  (1.778 m)     Head Circumference --      Peak Flow --      Pain Score 11/15/18 1129 0     Pain Loc --      Pain Edu? --      Excl. in [place]? --     Constitutional: Alert and oriented.  Head: Normocephalic. Atraumatic. Eyes: Conjunctivae clear. Sclera anicteric. Nose: No congestion. No rhinorrhea.  Mouth/Throat: Mucous membranes are extremely dry.  Neck: No stridor.  No midline C-spine tenderness. Cardiovascular: Normal rate. Extremities well perfused. Respiratory: Normal respiratory effort.  Lungs CTAB. Chest: Stable and nontender to palpation, no crepitance. Gastrointestinal: Soft. Non-tender. Non-distended.  Pelvis: Stable and nontender with AP and lateral compression. Musculoskeletal: No obvious deformities.  Tender about the right shoulder without obvious deformity, otherwise full range of motion without discomfort to the contralateral shoulder, as well as bilateral elbows, wrists, hips, knees, ankles. Back: No midline C/T/L-spine tenderness.  No step-offs or deformities. Neurologic:  Normal speech and language. No gross focal neurologic deficits are appreciated.  Skin: Scattered skin tears and ecchymosis to the extremities, notably ecchymosis to the right anterior shoulder, ecchymosis/skin tear to bilateral elbows, ecchymosis to right forearm and hand, and bilateral anterior knees. Psychiatric: Mood and affect are appropriate for situation.  EKG  Personally reviewed.   Rate: 97 Rhythm: sinus Axis: normal Intervals: prolonged QTc PVC Significant artifact Question incomplete RBBB V1-V3    Radiology  XR R shoulder: negative  XR R forearm: negative  XR R elbow: negative  XR R hand: negative  XR R knee: negative  XR L knee: pending  XR L elbow: negative  CT head/CS: IMPRESSION:  1. Progressive cerebral atrophy, ventriculomegaly and periventricular white matter disease.  2. No acute intracranial findings or skull fracture.  3. Degenerative cervical spondylosis with multilevel disc disease and facet disease but no acute cervical spine fracture or spinal canal compromise.    Procedures  Procedure(s) performed (including critical care):  .Critical Care Performed by: Lilia Pro., MD Authorized by: Lilia Pro., MD   Critical care provider statement:     Critical care time (minutes):  96   Critical care was time spent personally by me on the following activities:  Discussions with consultants, evaluation of patient's response to treatment, examination of patient, ordering and performing treatments and interventions, ordering and review of laboratory studies, ordering and review of radiographic studies, pulse oximetry, re-evaluation of patient's condition, obtaining history from patient or surrogate and review of old charts     Initial Impression / Assessment and Plan / ED Course  84 y.o. male who presents to the ED for fall with significant down time, as above.   Exam as above. Will obtain imaging to r/o traumatic injury. Will obtain labs, urine to evaluate for underlying etiology for fall, as well as potential complications as a results, such as AKI or CK elevation.   Labs notable for leukocytosis to 22, CK 3695, high-sensitivity troponin 263. Awaiting urine.   Discussed HS troponin/EKG with cardiology, suspect likely 2/2 demand in setting of fall and prolonged down time. No need for heparin at this time. Will trend and if repeat is significantly elevated,  could consider. IVF ordered. Discussed with hospitalist for admission.   Final Clinical Impression(s) / ED Diagnosis  Final diagnoses:  Fall, initial encounter  Elevated CK  Elevated troponin       Note:  This document was prepared using Dragon voice recognition software and may include unintentional dictation errors.   Lilia Pro., MD 11/15/18 [phone]

## 2018-11-15 NOTE — Progress Notes (Signed)
Advanced Care Plan.  Purpose of Encounter: CODE STATUS. Parties in Attendance: The patient, his son and me. Patient's Decisional Capacity: Yes. Medical Story: Daniel Edwards  is a 84 y.o. male with a known history of hypertension, COPD, BPH, GERD, and chronic bronchitis.  He is being admitted for rhabdo myelitis and elevated troponin.  I discussed with patient about his current condition, prognosis and CODE STATUS.  He does want to be resuscitated and intubated if he has cardiopulmonary arrest.  Plan:  Code Status: Full code. Time spent discussing advance care planning: 17 minutes.

## 2018-11-15 NOTE — ED Notes (Signed)
Patient transported to CT 

## 2018-11-15 NOTE — ED Notes (Signed)
ED TO INPATIENT HANDOFF REPORT  ED Nurse Name and Phone #: Lanetra Hartley [phone]  S Name/Age/Gender Daniel Edwards 84 y.o. male Room/Bed: ED02A/ED02A  Code Status   Code Status: Prior  Home/SNF/Other Home Patient oriented to: self, place and situation Is this baseline? Yes   Triage Complete: Triage complete  Chief Complaint fall  Triage Note Pt to ED via [facility] from home for chief complaint of fall that occurred at 1130 last night, denies head injury or LOC. EMS reports that Police were sent to check on pt when family couldn't get in touch with pt, was found on the floor and had been lying in the floor for approx 12 hours. Pt appears alert and oriented x4. Skin tears on both elbows, abrasions on both legs, skin dry, mucous membranes dry. Appears in NAD. Family/son at bedside now.    Allergies No Known Allergies  Level of Care/Admitting Diagnosis ED Disposition    ED Disposition Condition [facility]: [place] [[id number]]  Level of Care: Telemetry [5]  Covid Evaluation: Asymptomatic Screening Protocol (No Symptoms)  Diagnosis: Rhabdomyolysis [728.88.ICD-9-CM]  Admitting Physician: Demetrios Loll [id number]  Attending Physician: Demetrios Loll [id number]  Estimated length of stay: 3 - 4 days  Certification:: I certify this patient will need inpatient services for at least 2 midnights  PT Class (Do Not Modify): Inpatient [101]  PT Acc Code (Do Not Modify): Private [1]       B Medical/Surgery History Past Medical History:  Diagnosis Date  . BPH (benign prostatic hyperplasia)   . COPD (chronic obstructive pulmonary disease) ([place])   . Erectile dysfunction   . Essential hypertension   . GERD (gastroesophageal reflux disease)   . Simple chronic bronchitis ([place])    Past Surgical History:  Procedure Laterality Date  . INGUINAL HERNIA REPAIR       A IV Location/Drains/Wounds Patient Lines/Drains/Airways Status   Active Line/Drains/Airways     Name:   Placement date:   Placement time:   Site:   Days:   Peripheral IV 11/15/18 Right Antecubital   11/15/18    1127    Antecubital   less than 1          Intake/Output Last 24 hours No intake or output data in the 24 hours ending 11/15/18 1518  Labs/Imaging Results for orders placed or performed during the hospital encounter of 11/15/18 (from the past 48 hour(s))  Comprehensive metabolic panel     Status: Abnormal   Collection Time: 11/15/18 11:49 AM  Result Value Ref Range   Sodium 135 135 - 145 mmol/L   Potassium 2.9 (L) 3.5 - 5.1 mmol/L   Chloride 93 (L) 98 - 111 mmol/L   CO2 25 22 - 32 mmol/L   Glucose, Bld 101 (H) 70 - 99 mg/dL   BUN 18 8 - 23 mg/dL   Creatinine, Ser 1.03 0.61 - 1.24 mg/dL   Calcium 6.7 (L) 8.9 - 10.3 mg/dL   Total Protein 6.6 6.5 - 8.1 g/dL   Albumin 3.7 3.5 - 5.0 g/dL   AST 97 (H) 15 - 41 U/L   ALT 26 0 - 44 U/L   Alkaline Phosphatase 63 38 - 126 U/L   Total Bilirubin 1.3 (H) 0.3 - 1.2 mg/dL   GFR calc non Af Amer >60 >60 mL/min   GFR calc Af Amer >60 >60 mL/min   Anion gap 17 (H) 5 - 15    Comment: Performed at [facility],  [place], [place] [address]  CBC with Differential     Status: Abnormal   Collection Time: 11/15/18 11:49 AM  Result Value Ref Range   WBC 22.2 (H) 4.0 - 10.5 K/uL   RBC 4.68 4.22 - 5.81 MIL/uL   Hemoglobin 14.3 13.0 - 17.0 g/dL   HCT 40.8 39.0 - 52.0 %   MCV 87.2 80.0 - 100.0 fL   MCH 30.6 26.0 - 34.0 pg   MCHC 35.0 30.0 - 36.0 g/dL   RDW 12.8 11.5 - 15.5 %   Platelets 286 150 - 400 K/uL   nRBC 0.0 0.0 - 0.2 %   Neutrophils Relative % 90 %   Neutro Abs 20.2 (H) 1.7 - 7.7 K/uL   Lymphocytes Relative 2 %   Lymphs Abs 0.4 (L) 0.7 - 4.0 K/uL   Monocytes Relative 7 %   Monocytes Absolute 1.5 (H) 0.1 - 1.0 K/uL   Eosinophils Relative 0 %   Eosinophils Absolute 0.0 0.0 - 0.5 K/uL   Basophils Relative 0 %   Basophils Absolute 0.0 0.0 - 0.1 K/uL   Immature Granulocytes 1 %   Abs Immature  Granulocytes 0.20 (H) 0.00 - 0.07 K/uL    Comment: Performed at [facility], [address] [place], [place] [address]  Troponin I (High Sensitivity)     Status: Abnormal   Collection Time: 11/15/18 11:49 AM  Result Value Ref Range   Troponin I (High Sensitivity) 263 (HH) <18 ng/L    Comment: CRITICAL RESULT CALLED TO, READ BACK BY AND VERIFIED WITH AMBER PAYNE AT 1248 11/15/2018 DAS (NOTE) Elevated high sensitivity troponin I (hsTnI) values and significant  changes across serial measurements may suggest ACS but many other  chronic and acute conditions are known to elevate hsTnI results.  Refer to the "Links" section for chest pain algorithms and additional  guidance. Performed at [facility], [address] [place], [place] [address]   CK     Status: Abnormal   Collection Time: 11/15/18 11:49 AM  Result Value Ref Range   Total CK 3,695 (H) 49 - 397 U/L    Comment: Performed at [facility], [place], [place] [address]  Troponin I (High Sensitivity)     Status: Abnormal   Collection Time: 11/15/18  2:29 PM  Result Value Ref Range   Troponin I (High Sensitivity) 234 (HH) <18 ng/L    Comment: CRITICAL VALUE NOTED. VALUE IS CONSISTENT WITH PREVIOUSLY REPORTED/CALLED VALUE KMP/MJU (NOTE) Elevated high sensitivity troponin I (hsTnI) values and significant  changes across serial measurements may suggest ACS but many other  chronic and acute conditions are known to elevate hsTnI results.  Refer to the "Links" section for chest pain algorithms and additional  guidance. Performed at [facility], [address] [place],  [address]    Dg Shoulder Right  Result Date: 11/15/2018 CLINICAL DATA:  Status post fall, pain EXAM: RIGHT SHOULDER - 2+ VIEW COMPARISON:  None. FINDINGS: There is no fracture or dislocation. There are moderate degenerative changes of the acromioclavicular joint. There is mild mineralization of the right  supraspinatus tendon likely reflecting calcific tendinosis. IMPRESSION: No acute osseous injury of the right shoulder. Electronically Signed   By: Kathreen Devoid   On: 11/15/2018 14:28   Dg Elbow Complete Left  Result Date: 11/15/2018 CLINICAL DATA:  Status post fall EXAM: LEFT ELBOW - COMPLETE 3+ VIEW COMPARISON:  None. FINDINGS: There is no evidence of fracture, dislocation,  or joint effusion. There is no evidence of arthropathy or other focal bone abnormality. Soft tissues are unremarkable. IMPRESSION: No acute osseous injury of the left elbow. Electronically Signed   By: Kathreen Devoid   On: 11/15/2018 14:33   Dg Elbow Complete Right  Result Date: 11/15/2018 CLINICAL DATA:  Right elbow pain status post fall EXAM: RIGHT ELBOW - COMPLETE 3+ VIEW COMPARISON:  None. FINDINGS: There is no evidence of fracture, dislocation, or joint effusion. There is no evidence of arthropathy or other focal bone abnormality. Soft tissues are unremarkable. IMPRESSION: No acute osseous injury of the right elbow. Electronically Signed   By: Kathreen Devoid   On: 11/15/2018 14:34   Dg Forearm Right  Result Date: 11/15/2018 CLINICAL DATA:  Status post fall.  Forearm pain. EXAM: RIGHT FOREARM - 2 VIEW COMPARISON:  None. FINDINGS: There is no evidence of fracture or other focal bone lesions. There is generalized osteopenia. There is osteoarthritis of the scaphotrapeziotrapezoid joint and first [facility] joint. Soft tissues are unremarkable. IMPRESSION: No acute osseous injury of the right forearm. Electronically Signed   By: Kathreen Devoid   On: 11/15/2018 14:31   Ct Head Wo Contrast  Result Date: 11/15/2018 CLINICAL DATA:  [place] last evening.  Found down. EXAM: CT HEAD WITHOUT CONTRAST CT CERVICAL SPINE WITHOUT CONTRAST TECHNIQUE: Multidetector CT imaging of the head and cervical spine was performed following the standard protocol without intravenous contrast. Multiplanar CT image reconstructions of the cervical spine were also generated.  COMPARISON:  12/14/2012 FINDINGS: CT HEAD FINDINGS Brain: Progressive cerebral atrophy, ventriculomegaly and periventricular white matter disease since 2014. No acute intracranial findings such as hemispheric infarction or intracranial hemorrhage. No extra-axial fluid collections are identified. The brainstem and cerebellum are grossly normal. Vascular: Moderate vascular calcifications. No aneurysm or hyperdense vessels. Skull: No skull fracture or bone lesions. Sinuses/Orbits: Scattered ethmoid and right half sphenoid sinus disease. The mastoid air cells and middle ear cavities are clear. The globes are intact. Other: No scalp lesions or hematoma. CT CERVICAL SPINE FINDINGS Alignment: The overall alignment is maintained. There is mild degenerative posterior subluxation of C5 noted. Skull base and vertebrae: No acute fracture. No primary bone lesion or focal pathologic process. Moderate C1-2 degenerative changes with partially calcified pannus but no significant mass effect on the upper cervical cord. Soft tissues and spinal canal: No prevertebral fluid or swelling. No visible canal hematoma. Disc levels: The spinal canal is fairly generous. No significant spinal stenosis. No large disc protrusions. There is moderate right foraminal stenosis at C4-5 due to uncinate spurring and severe right-sided facet disease. Mild bilateral foraminal stenosis at C5-6 and C6-7. Upper chest: The lung apices demonstrate emphysematous changes and pulmonary scarring. Other: No neck mass or adenopathy or hematoma. Carotid artery calcifications are noted bilaterally. IMPRESSION: 1. Progressive cerebral atrophy, ventriculomegaly and periventricular white matter disease. 2. No acute intracranial findings or skull fracture. 3. Degenerative cervical spondylosis with multilevel disc disease and facet disease but no acute cervical spine fracture or spinal canal compromise. Electronically Signed   By: Marijo Sanes M.D.   On: 11/15/2018 13:44    Ct Cervical Spine Wo Contrast  Result Date: 11/15/2018 CLINICAL DATA:  [place] last evening.  Found down. EXAM: CT HEAD WITHOUT CONTRAST CT CERVICAL SPINE WITHOUT CONTRAST TECHNIQUE: Multidetector CT imaging of the head and cervical spine was performed following the standard protocol without intravenous contrast. Multiplanar CT image reconstructions of the cervical spine were also generated. COMPARISON:  12/14/2012 FINDINGS: CT HEAD FINDINGS Brain:  Progressive cerebral atrophy, ventriculomegaly and periventricular white matter disease since 2014. No acute intracranial findings such as hemispheric infarction or intracranial hemorrhage. No extra-axial fluid collections are identified. The brainstem and cerebellum are grossly normal. Vascular: Moderate vascular calcifications. No aneurysm or hyperdense vessels. Skull: No skull fracture or bone lesions. Sinuses/Orbits: Scattered ethmoid and right half sphenoid sinus disease. The mastoid air cells and middle ear cavities are clear. The globes are intact. Other: No scalp lesions or hematoma. CT CERVICAL SPINE FINDINGS Alignment: The overall alignment is maintained. There is mild degenerative posterior subluxation of C5 noted. Skull base and vertebrae: No acute fracture. No primary bone lesion or focal pathologic process. Moderate C1-2 degenerative changes with partially calcified pannus but no significant mass effect on the upper cervical cord. Soft tissues and spinal canal: No prevertebral fluid or swelling. No visible canal hematoma. Disc levels: The spinal canal is fairly generous. No significant spinal stenosis. No large disc protrusions. There is moderate right foraminal stenosis at C4-5 due to uncinate spurring and severe right-sided facet disease. Mild bilateral foraminal stenosis at C5-6 and C6-7. Upper chest: The lung apices demonstrate emphysematous changes and pulmonary scarring. Other: No neck mass or adenopathy or hematoma. Carotid artery calcifications  are noted bilaterally. IMPRESSION: 1. Progressive cerebral atrophy, ventriculomegaly and periventricular white matter disease. 2. No acute intracranial findings or skull fracture. 3. Degenerative cervical spondylosis with multilevel disc disease and facet disease but no acute cervical spine fracture or spinal canal compromise. Electronically Signed   By: Marijo Sanes M.D.   On: 11/15/2018 13:44   Dg Knee Complete 4 Views Right  Result Date: 11/15/2018 CLINICAL DATA:  Status post fall EXAM: RIGHT KNEE - COMPLETE 4+ VIEW COMPARISON:  None. FINDINGS: No acute fracture or dislocation. No aggressive osseous lesion. No joint effusion. Chondrocalcinosis of the medial and lateral femorotibial compartments as can be seen with CPPD. IMPRESSION: 1.  No acute osseous injury of the right knee. Electronically Signed   By: Kathreen Devoid   On: 11/15/2018 14:30   Dg Hand Complete Right  Result Date: 11/15/2018 CLINICAL DATA:  Status post fall, right hand pain EXAM: RIGHT HAND - COMPLETE 3+ VIEW COMPARISON:  None. FINDINGS: Generalized osteopenia. No fracture or dislocation. No aggressive osseous lesion. Moderate osteoarthritis of the scaphotrapeziotrapezoid joint. Mild osteoarthritis of the first [facility] joint and first MCP joint. Moderate osteoarthritis of the first IP joint. 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 Chondrocalcinosis of the third MCP joint. Mild joint space narrowing of the second and third MCP joints with small marginal osteophytes. No soft tissue abnormality. IMPRESSION: 1.  No acute osseous injury of the right hand. 2. Osteoarthritis of the right hand as described above. Electronically Signed   By: Kathreen Devoid   On: 11/15/2018 14:32    Pending Labs Unresulted Labs (From admission, onward)    Start     Ordered   11/15/18 1259  SARS Coronavirus 2 by RT PCR (hospital order, performed in [facility] hospital lab) Nasopharyngeal Nasopharyngeal Swab  (Symptomatic/High Risk of Exposure/Tier 1  Patients Labs with Precautions)  ONCE - STAT,   STAT    Question Answer Comment  Is this test for diagnosis or screening Diagnosis of ill patient   Symptomatic for COVID-19 as defined by CDC Yes   Date of Symptom Onset 11/15/2018   Hospitalized for COVID-19 No   Admitted to ICU for COVID-19 No   Previously tested for COVID-19 No   Resident in a congregate (group) care  setting No   Employed in healthcare setting No      11/15/18 1258   11/15/18 1213  Urine culture  Once,   STAT     11/15/18 1212   11/15/18 1149  Urinalysis, Complete w Microscopic  ONCE - STAT,   STAT     11/15/18 1149   Signed and Held  Basic metabolic panel  Tomorrow morning,   R     Signed and Held   Signed and Held  CBC  Tomorrow morning,   R     Signed and Held   Signed and Held  CK  Tomorrow morning,   R     Signed and Held   Signed and Held  Magnesium  Add-on,   R     Signed and Held   Signed and Held  Lipid panel  Tomorrow morning,   R     Signed and Held          Vitals/Pain Today's Vitals   11/15/18 1245 11/15/18 1300 11/15/18 1431 11/15/18 1436  BP:  118/62 102/75   Pulse: 83 74  85  Resp: 19 16 (!) 25 (!) 24  Temp:      TempSrc:      SpO2: 96% 95%  95%  Weight:      Height:      PainSc:        Isolation Precautions No active isolations  Medications Medications  sodium chloride 0.9 % bolus 500 mL (0 mLs Intravenous Stopped 11/15/18 1301)  sodium chloride 0.9 % bolus 500 mL (500 mLs Intravenous New Bag/Given 11/15/18 1412)  aspirin chewable tablet 324 mg (324 mg Oral Given 11/15/18 1413)    Mobility walks with device High fall risk   Focused Assessments Cardiac Assessment Handoff:    Lab Results  Component Value Date   CKTOTAL 3,695 (H) 11/15/2018   TROPONINI <0.03 07/21/2015   No results found for: DDIMER Does the Patient currently have chest pain? No     R Recommendations: See Admitting Provider Note  Report given to:   Additional Notes:

## 2018-11-16 ENCOUNTER — Inpatient Hospital Stay (HOSPITAL_COMMUNITY)
Admit: 2018-11-16 | Discharge: 2018-11-16 | Disposition: A | Discharge status: Home / Self Care | Payer: Medicare HMO | Attending: Internal Medicine | Admitting: Internal Medicine

## 2018-11-16 DIAGNOSIS — I10 Essential (primary) hypertension: Secondary | ICD-10-CM

## 2018-11-16 LAB — CBC
HCT: 35.1 % — ABNORMAL LOW (ref 39.0–52.0)
Hemoglobin: 11.8 g/dL — ABNORMAL LOW (ref 13.0–17.0)
MCH: 30.1 pg (ref 26.0–34.0)
MCHC: 33.6 g/dL (ref 30.0–36.0)
MCV: 89.5 fL (ref 80.0–100.0)
Platelets: 191 10*3/uL (ref 150–400)
RBC: 3.92 MIL/uL — ABNORMAL LOW (ref 4.22–5.81)
RDW: 13.2 % (ref 11.5–15.5)
WBC: 12.7 10*3/uL — ABNORMAL HIGH (ref 4.0–10.5)
nRBC: 0 % (ref 0.0–0.2)

## 2018-11-16 LAB — BASIC METABOLIC PANEL
Anion gap: 11 (ref 5–15)
BUN: 21 mg/dL (ref 8–23)
CO2: 25 mmol/L (ref 22–32)
Calcium: 5.9 mg/dL — CL (ref 8.9–10.3)
Chloride: 103 mmol/L (ref 98–111)
Creatinine, Ser: 1.01 mg/dL (ref 0.61–1.24)
GFR calc Af Amer: >60 mL/min (ref >60)
GFR calc non Af Amer: >60 mL/min (ref >60)
Glucose, Bld: 75 mg/dL (ref 70–99)
Potassium: 3.5 mmol/L (ref 3.5–5.1)
Sodium: 139 mmol/L (ref 135–145)

## 2018-11-16 LAB — ECHOCARDIOGRAM COMPLETE
Height: 70 in
Weight: 1964.8 oz

## 2018-11-16 LAB — LIPID PANEL
Cholesterol: 176 mg/dL (ref 0–200)
HDL: 69 mg/dL (ref >40)
LDL Cholesterol: 95 mg/dL (ref 0–99)
Total CHOL/HDL Ratio: 2.6 RATIO
Triglycerides: 59 mg/dL (ref <150)
VLDL: 12 mg/dL (ref 0–40)

## 2018-11-16 LAB — MAGNESIUM: Magnesium: 1.9 mg/dL (ref 1.7–2.4)

## 2018-11-16 LAB — CK: Total CK: 3648 U/L — ABNORMAL HIGH (ref 49–397)

## 2018-11-16 MED ORDER — CALCIUM GLUCONATE-NACL 1-0.675 GM/50ML-% IV SOLN
1.0000 g | Freq: Once | INTRAVENOUS | Status: AC
  Administered 2018-11-16: 1000 mg via INTRAVENOUS
  Filled 2018-11-16: qty 50

## 2018-11-16 MED ORDER — TRAZODONE HCL 50 MG PO TABS
50.0000 mg | ORAL_TABLET | Freq: Every evening | ORAL | Status: DC | PRN
  Administered 2018-11-16: 21:00:00 50 mg via ORAL
  Filled 2018-11-16: qty 1

## 2018-11-16 MED ORDER — ADULT MULTIVITAMIN W/MINERALS CH
1.0000 | ORAL_TABLET | Freq: Every day | ORAL | Status: DC
  Administered 2018-11-17 – 2018-11-21 (×5): 1 via ORAL
  Filled 2018-11-16 (×5): qty 1

## 2018-11-16 MED ORDER — ENSURE ENLIVE PO LIQD
237.0000 mL | Freq: Two times a day (BID) | ORAL | Status: DC
  Administered 2018-11-16 – 2018-11-18 (×2): 237 mL via ORAL

## 2018-11-16 NOTE — Progress Notes (Signed)
*  PRELIMINARY RESULTS* Echocardiogram 2D Echocardiogram has been performed.  Sherrie Sport 11/16/2018, 8:55 AM

## 2018-11-16 NOTE — Evaluation (Signed)
Physical Therapy Evaluation Patient Details Name: Daniel Edwards MRN: [id number] DOB: March 20, 1934 Today's Date: 11/16/2018   History of Present Illness  presented to ER secondary to fall in home environment, in floor x12 hours prior to assist; admitted for management of acute rhabdomyolysis.  Imaging (CTH, c-spine, bilat elbows, bilat knees, R hand/forearm/shoulder) negative for acute injury.  Clinical Impression  Upon evaluation, patient alert and oriented; follows commands, but generally impulsive requiring frequent cuing for safety needs.  Bilat UE/LE strength generally weakened (4-/5) and slightly tremulous/discoordinated, but no asymmetry in strength, sensation or coordination noted.  Currently requiring min assist for bed mobility; min assist for unsupported sitting balance; heavy mod assist for sit/stand and static standing balance without assist device; heavy mod assist for SPT with and without RW.  Very unsteady and unsafe; requires RW and +1 assist at all times.  Would benefit from skilled PT to address above deficits and promote optimal return to PLOF; recommend transition to STR upon discharge from acute hospitalization.     Follow Up Recommendations SNF    Equipment Recommendations  Rolling walker with 5" wheels;3in1 (PT)    Recommendations for Other Services       Precautions / Restrictions Precautions Precautions: Fall Restrictions Weight Bearing Restrictions: No      Mobility  Bed Mobility Overal bed mobility: Needs Assistance Bed Mobility: Supine to Sit     Supine to sit: Min assist        Transfers Overall transfer level: Needs assistance   Transfers: Sit to/from Stand Sit to Stand: Mod assist         General transfer comment: assist for lift off, balance and correction of heavy posterior lean  Ambulation/Gait Ambulation/Gait assistance: Mod assist Gait Distance (Feet): (5'x2) Assistive device: None       General Gait Details: initial attempt  with R HHA, heavy mod assist-very poor balance/midline orientation, constantly/impulsively reaching towards furniture for additional stabilization; additional trial with RW, mod assist-very impulsive with constant cuing for walker position use (urge incontinence/bowel during transfer)  Stairs            Wheelchair Mobility    Modified Rankin (Stroke Patients Only)       Balance Overall balance assessment: Needs assistance Sitting-balance support: No upper extremity supported;Feet supported Sitting balance-Leahy Scale: Fair     Standing balance support: No upper extremity supported Standing balance-Leahy Scale: Zero Standing balance comment: heavy posterior lean, limited ability to self-correct                             Pertinent Vitals/Pain Pain Assessment: Faces Faces Pain Scale: Hurts little more Pain Location: generalized soreness Pain Descriptors / Indicators: Aching;Sore Pain Intervention(s): Limited activity within patient's tolerance;Monitored during session;Repositioned    Home Living Family/patient expects to be discharged to:: Private residence Living Arrangements: Alone   Type of Home: House Home Access: Level entry     Home Layout: One level Home Equipment: Walker - 2 wheels      Prior Function Level of Independence: Independent         Comments: Indep with ADLs, household and limited community mobilization; has access to RW and collects canes, but does not consistently utilize either.  + driving.  Denies additional fall history.     Hand Dominance        Extremity/Trunk Assessment   Upper Extremity Assessment Upper Extremity Assessment: Generalized weakness(grossly 4-/5 bilat, generalized soreness; mildly tremulous)  Lower Extremity Assessment Lower Extremity Assessment: Generalized weakness(grossly 4-/5, generalized soreness; mildly tremulous)       Communication   Communication: No difficulties  Cognition  Arousal/Alertness: Awake/alert Behavior During Therapy: Impulsive Overall Cognitive Status: No family/caregiver present to determine baseline cognitive functioning                                 General Comments: oriented to basic information; follows commands, impulsive with limited insight      General Comments      Exercises Other Exercises Other Exercises: Toilet transfer, SPT with RW, mod/max assist-poor management of RW (limited, in part, due to bowel incontinence), very impulsive, unbalanced and unsteady.   Assessment/Plan    PT Assessment Patient needs continued PT services  PT Problem List Decreased strength;Decreased range of motion;Decreased activity tolerance;Decreased balance;Decreased mobility;Decreased coordination;Decreased cognition;Decreased knowledge of use of DME;Decreased safety awareness;Decreased knowledge of precautions;Decreased skin integrity;Pain       PT Treatment Interventions DME instruction;Gait training;Functional mobility training;Therapeutic activities;Therapeutic exercise;Balance training;Cognitive remediation;Patient/family education    PT Goals (Current goals can be found in the Care Plan section)  Acute Rehab PT Goals Patient Stated Goal: to go to the bathroom PT Goal Formulation: With patient Time For Goal Achievement: 11/30/18 Potential to Achieve Goals: Good    Frequency Min 2X/week   Barriers to discharge Decreased caregiver support      Co-evaluation               AM-PAC PT "6 Clicks" Mobility  Outcome Measure Help needed turning from your back to your side while in a flat bed without using bedrails?: A Little Help needed moving from lying on your back to sitting on the side of a flat bed without using bedrails?: A Little Help needed moving to and from a bed to a chair (including a wheelchair)?: A Lot Help needed standing up from a chair using your arms (e.g., wheelchair or bedside chair)?: A Lot Help needed  to walk in hospital room?: A Lot Help needed climbing 3-5 steps with a railing? : Total 6 Click Score: 13    End of Session Equipment Utilized During Treatment: Gait belt Activity Tolerance: Patient tolerated treatment well Patient left: (on BSC with CNA at bedside to complete ADL and assist with return to chair once complete) Nurse Communication: Mobility status PT Visit Diagnosis: Muscle weakness (generalized) (M62.81);Difficulty in walking, not elsewhere classified (R26.2);History of falling (Z91.81);Unsteadiness on feet (R26.81)    Time: [id number] PT Time Calculation (min) (ACUTE ONLY): 27 min   Charges:   PT Evaluation $PT Eval Moderate Complexity: 1 Mod PT Treatments $Therapeutic Activity: 8-22 mins        Aubryanna Nesheim H. Owens Shark, PT, DPT, NCS 11/16/18, 11:29 AM [phone]

## 2018-11-16 NOTE — Progress Notes (Signed)
Initial Nutrition Assessment  DOCUMENTATION CODES:   Underweight  INTERVENTION:   Ensure Enlive po BID, each supplement provides 350 kcal and 20 grams of protein  MVI daily   Liberalize diet   Pt likely at refeed risk; recommend monitor K, Mg and P labs daily until stable.   NUTRITION DIAGNOSIS:   Increased nutrient needs related to catabolic illness(COPD) as evidenced by increased estimated needs.  GOAL:   Patient will meet greater than or equal to 90% of their needs  MONITOR:   PO intake, Supplement acceptance, Labs, Weight trends, Skin, I & O's  REASON FOR ASSESSMENT:   Malnutrition Screening Tool    ASSESSMENT:   84 y.o. male with a known history of hypertension, COPD, BPH, GERD, and chronic bronchitis who is admitted for rhabdomyelitis and elevated troponin s/p fall.  RD working remotely.  Pt with increased estimated needs r/t COPD. Suspect pt with poor appetite and oral intake at baseline as pt is underweight. RD will add supplements and MVI to help pt meet his estimated needs. RD will also liberalize pt's diet. Suspect pt is a refeed risk. There is no weight history in chart to determine if any recent significant weight loss.   Pt at high risk for malnutrition but unable to diagnose at this time as NFPE cannot be performed.   Medications reviewed and include: aspirin, lovenox, Mg oxide, nicotine, protonix, NaCl @75ml /hr  Labs reviewed: Ca 5.9(L), Mg 1.9 wnl Wbc- 12.7(H)  Unable to complete Nutrition-Focused physical exam at this time.   Diet Order:   Diet Order            Diet Heart Room service appropriate? Yes; Fluid consistency: Thin  Diet effective now             EDUCATION NEEDS:   No education needs have been identified at this time  Skin:  Skin Assessment: Reviewed RN Assessment(abrasions arms and legs s/p fall)  Last BM:  10/6  Height:   Ht Readings from Last 1 Encounters:  11/15/18 5\' 10"  (1.778 m)    Weight:   Wt Readings  from Last 1 Encounters:  11/15/18 55.7 kg    Ideal Body Weight:  75.4 kg  BMI:  Body mass index is 17.62 kg/m.  Estimated Nutritional Needs:   Kcal:  1700-1900kcal/day  Protein:  85-95g/day  Fluid:  >1.4L/day  Koleen Distance MS, RD, LDN Pager #- [phone] Office#- [phone] After Hours Pager: [phone]

## 2018-11-16 NOTE — Evaluation (Signed)
Occupational Therapy Evaluation Patient Details Name: Daniel Edwards MRN: [id number] DOB: 1934/11/14 Today's Date: 11/16/2018    History of Present Illness presented to ER secondary to fall in home environment, in floor x12 hours prior to assist; admitted for management of acute rhabdomyolysis.  Imaging (CTH, c-spine, bilat elbows, bilat knees, R hand/forearm/shoulder) negative for acute injury.   Clinical Impression   Pt seen for OT evaluation this date. Prior to hospital admission, pt reports he was independent with no other falls history.  Pt lives alone and reports his son calls to check on him every other day approximately. Pt denies difficulty being independent at home, driving, running errands, and medication mgt. Currently pt demonstrates impairments in strength, balance, activity tolerance, insight/awareness of deficits and safety requiring min-mod assist for ADL and functional mobility. Pt refuses to reach out to nursing for assist if he needs to get up, despite education in pt's falls risk and hospital policy. Chair alarm in place and turned on. RN notified, for pt safety at end of session. Pt would benefit from skilled OT to address noted impairments and functional limitations (see below for any additional details) in order to maximize safety and independence while minimizing falls risk and caregiver burden.  Upon hospital discharge, recommend pt discharge to [place] prior to return home with emphasis on edu/training in ADL/IADL mgt skills to ensure pt is able to perform not only basic ADL safely, but also IADL tasks such as meal prep, financial mgt, and medication mgt safely. Pt may require increased supervision for safety if/when he does return home.    Follow Up Recommendations  SNF    Equipment Recommendations  Other (comment)(life alert pendant if he returns home)    Recommendations for Other Services       Precautions / Restrictions Precautions Precautions:  Fall Restrictions Weight Bearing Restrictions: No      Mobility Bed Mobility               General bed mobility comments: deferred, received in recliner  Transfers                      Balance Overall balance assessment: Needs assistance Sitting-balance support: No upper extremity supported;Feet supported Sitting balance-Leahy Scale: Fair     Standing balance support: No upper extremity supported Standing balance-Leahy Scale: Zero Standing balance comment: heavy posterior lean, limited ability to self-correct                           ADL either performed or assessed with clinical judgement   ADL Overall ADL's : Needs assistance/impaired                                       General ADL Comments: Min A for LB ADL, CGA to Min A for dynamic sitting balance, Mod A for functional transfers     Vision Patient Visual Report: No change from baseline Vision Assessment?: No apparent visual deficits     Perception     Praxis      Pertinent Vitals/Pain Pain Assessment: Faces Faces Pain Scale: Hurts a little bit Pain Location: generalized soreness, LUE Pain Descriptors / Indicators: Aching;Sore Pain Intervention(s): Limited activity within patient's tolerance;Monitored during session     Hand Dominance     Extremity/Trunk Assessment Upper Extremity Assessment Upper Extremity Assessment: Generalized weakness(grossly 4-/5 bilat, generalized  soreness; mildly tremulous)   Lower Extremity Assessment Lower Extremity Assessment: Generalized weakness(grossly 4-/5, generalized soreness; mildly tremulous)       Communication Communication Communication: No difficulties   Cognition Arousal/Alertness: Awake/alert Behavior During Therapy: Impulsive Overall Cognitive Status: No family/caregiver present to determine baseline cognitive functioning                                 General Comments: oriented to basic  information; follows commands, impulsive with limited insight into deficits and safety awareness   General Comments  multiple bruises, thin mottled skin    Exercises     Shoulder Instructions      Home Living Family/patient expects to be discharged to:: Private residence Living Arrangements: Alone Available Help at Discharge: Family;Neighbor;Available PRN/intermittently(Pt reports he has people, son calls to check on in every other day) Type of Home: House Home Access: Level entry     Home Layout: One level               Home Equipment: Walker - 2 wheels          Prior Functioning/Environment Level of Independence: Independent        Comments: Indep with ADLs, household and limited community mobilization; has access to RW and collects canes, but does not consistently utilize either.  + driving.  Denies additional fall history.        OT Problem List: Decreased strength;Decreased cognition;Decreased safety awareness;Impaired balance (sitting and/or standing);Decreased knowledge of use of DME or AE      OT Treatment/Interventions: Self-care/ADL training;Therapeutic exercise;Therapeutic activities;DME and/or AE instruction;Patient/family education;Cognitive remediation/compensation;Balance training    OT Goals(Current goals can be found in the care plan section) Acute Rehab OT Goals Patient Stated Goal: go home OT Goal Formulation: With patient Time For Goal Achievement: 11/30/18 Potential to Achieve Goals: Fair ADL Goals Pt Will Perform Lower Body Dressing: with min guard assist;sit to/from stand Pt Will Transfer to Toilet: with min guard assist;ambulating(BSC over toilet, LRAD for amb, no LOB) Pt Will Perform Toileting - Clothing Manipulation and hygiene: with min guard assist;sit to/from stand Additional ADL Goal #1: Pt will perform medication mgt exercise with set up and supervision.  OT Frequency: Min 1X/week   Barriers to D/C: Decreased caregiver  support          Co-evaluation              AM-PAC OT "6 Clicks" Daily Activity     Outcome Measure Help from another person eating meals?: None Help from another person taking care of personal grooming?: None Help from another person toileting, which includes using toliet, bedpan, or urinal?: A Lot Help from another person bathing (including washing, rinsing, drying)?: A Lot Help from another person to put on and taking off regular upper body clothing?: None Help from another person to put on and taking off regular lower body clothing?: A Lot 6 Click Score: 18   End of Session Nurse Communication: Other (comment)(pt status, decreased safety awareness)  Activity Tolerance: Patient tolerated treatment well Patient left: in chair;with call bell/phone within reach;with chair alarm set  OT Visit Diagnosis: Other abnormalities of gait and mobility (R26.89);History of falling (Z91.81);Muscle weakness (generalized) (M62.81)                Time: [id number] OT Time Calculation (min): 18 min Charges:  OT General Charges $OT Visit: 1 Visit OT Evaluation $OT Eval Moderate Complexity: 1 Mod  Jeni Salles, MPH, MS, OTR/L ascom [phone] 11/16/18, 4:00 PM

## 2018-11-17 LAB — BASIC METABOLIC PANEL
Anion gap: 6 (ref 5–15)
BUN: 25 mg/dL — ABNORMAL HIGH (ref 8–23)
CO2: 26 mmol/L (ref 22–32)
Calcium: 6.1 mg/dL — CL (ref 8.9–10.3)
Chloride: 106 mmol/L (ref 98–111)
Creatinine, Ser: 0.95 mg/dL (ref 0.61–1.24)
GFR calc Af Amer: >60 mL/min (ref >60)
GFR calc non Af Amer: >60 mL/min (ref >60)
Glucose, Bld: 74 mg/dL (ref 70–99)
Potassium: 3.9 mmol/L (ref 3.5–5.1)
Sodium: 138 mmol/L (ref 135–145)

## 2018-11-17 LAB — CBC
HCT: 30.8 % — ABNORMAL LOW (ref 39.0–52.0)
Hemoglobin: 10.2 g/dL — ABNORMAL LOW (ref 13.0–17.0)
MCH: 30.3 pg (ref 26.0–34.0)
MCHC: 33.1 g/dL (ref 30.0–36.0)
MCV: 91.4 fL (ref 80.0–100.0)
Platelets: 150 10*3/uL (ref 150–400)
RBC: 3.37 MIL/uL — ABNORMAL LOW (ref 4.22–5.81)
RDW: 13 % (ref 11.5–15.5)
WBC: 10.4 10*3/uL (ref 4.0–10.5)
nRBC: 0 % (ref 0.0–0.2)

## 2018-11-17 LAB — URINALYSIS, COMPLETE (UACMP) WITH MICROSCOPIC
Bilirubin Urine: NEGATIVE
Glucose, UA: NEGATIVE mg/dL
Hgb urine dipstick: NEGATIVE
Ketones, ur: NEGATIVE mg/dL
Leukocytes,Ua: NEGATIVE
Nitrite: NEGATIVE
Protein, ur: NEGATIVE mg/dL
Specific Gravity, Urine: 1.018 (ref 1.005–1.030)
pH: 5 (ref 5.0–8.0)

## 2018-11-17 LAB — PHOSPHORUS: Phosphorus: 2 mg/dL — ABNORMAL LOW (ref 2.5–4.6)

## 2018-11-17 LAB — CK: Total CK: 2813 U/L — ABNORMAL HIGH (ref 49–397)

## 2018-11-17 MED ORDER — CALCIUM GLUCONATE-NACL 2-0.675 GM/100ML-% IV SOLN
2.0000 g | Freq: Once | INTRAVENOUS | Status: AC
  Administered 2018-11-17: 09:00:00 2000 mg via INTRAVENOUS
  Filled 2018-11-17: qty 100

## 2018-11-17 MED ORDER — K PHOS MONO-SOD PHOS DI & MONO 155-852-130 MG PO TABS
500.0000 mg | ORAL_TABLET | ORAL | Status: AC
  Administered 2018-11-17 – 2018-11-18 (×3): 500 mg via ORAL
  Filled 2018-11-17 (×4): qty 2

## 2018-11-17 NOTE — Progress Notes (Signed)
[place] at [place] NAME: Daniel Edwards    MR#:  [id number]  DATE OF BIRTH:  July 25, 1934  SUBJECTIVE:  CHIEF COMPLAINT:   Chief Complaint  Patient presents with  . Fall   Came after a fall at home and could not get up.  Noted to have rhabdomyolysis.  Also has some urinary incontinence symptoms. Says that he lives independent and feels slightly better today.  REVIEW OF SYSTEMS:  CONSTITUTIONAL: No fever, have fatigue or weakness.  EYES: No blurred or double vision.  EARS, NOSE, AND THROAT: No tinnitus or ear pain.  RESPIRATORY: No cough, shortness of breath, wheezing or hemoptysis.  CARDIOVASCULAR: No chest pain, orthopnea, edema.  GASTROINTESTINAL: No nausea, vomiting, diarrhea or abdominal pain.  GENITOURINARY: No dysuria, hematuria.  ENDOCRINE: No polyuria, nocturia,  HEMATOLOGY: No anemia, easy bruising or bleeding SKIN: No rash or lesion. MUSCULOSKELETAL: No joint pain or arthritis.   NEUROLOGIC: No tingling, numbness, weakness.  PSYCHIATRY: No anxiety or depression.   ROS  DRUG ALLERGIES:  No Known Allergies  VITALS:  Blood pressure 112/63, pulse 69, temperature 98 F (36.7 C), temperature source Oral, resp. rate (!) 24, height 5\' 10"  (D34-534 m), weight 58.3 kg, SpO2 90 %.  PHYSICAL EXAMINATION:  GENERAL:  84 y.o.-year-old patient lying in the bed with no acute distress.  There is some smell of urine in his room while I entered. EYES: Pupils equal, round, reactive to light and accommodation. No scleral icterus. Extraocular muscles intact.  HEENT: Head atraumatic, normocephalic. Oropharynx and nasopharynx clear.  NECK:  Supple, no jugular venous distention. No thyroid enlargement, no tenderness.  LUNGS: Normal breath sounds bilaterally, no wheezing, rales,rhonchi or crepitation. No use of accessory muscles of respiration.  CARDIOVASCULAR: S1, S2 normal. No murmurs, rubs, or gallops.  ABDOMEN: Soft, nontender, nondistended.  Bowel sounds present. No organomegaly or mass.  EXTREMITIES: No pedal edema, cyanosis, or clubbing.  NEUROLOGIC: Cranial nerves II through XII are intact. Muscle strength 4/5 in all extremities. Sensation intact. Gait not checked.  PSYCHIATRIC: The patient is alert and oriented x 3.  SKIN: No obvious rash, lesion, or ulcer.   Physical Exam LABORATORY PANEL:   CBC Recent Labs  Lab 11/17/18 0602  WBC 10.4  HGB 10.2*  HCT 30.8*  PLT 150   ------------------------------------------------------------------------------------------------------------------  Chemistries  Recent Labs  Lab 11/15/18 1149  11/16/18 0652 11/17/18 0602  NA 135  --  139 138  K 2.9*  --  3.5 3.9  CL 93*  --  103 106  CO2 25  --  25 26  GLUCOSE 101*  --  75 74  BUN 18  --  21 25*  CREATININE 1.03  --  1.01 0.95  CALCIUM 6.7*  --  5.9* 6.1*  MG  --    < > 1.9  --   AST 97*  --   --   --   ALT 26  --   --   --   ALKPHOS 63  --   --   --   BILITOT 1.3*  --   --   --    < > = values in this interval not displayed.   ------------------------------------------------------------------------------------------------------------------  Cardiac Enzymes No results for input(s): TROPONINI in the last 168 hours. ------------------------------------------------------------------------------------------------------------------  RADIOLOGY:  No results found.  ASSESSMENT AND PLAN:   Active Problems:   Rhabdomyolysis  * Rhabdomyolysis due to fall. IV fluid support and follow-up CK. No fractures noted on x-rays.  *  Elevated troponin, possible due to rhabdomyolysis.  Continue aspirin and follow-up troponin level remained stable.  Echocardiograph.  * Fall and weakness.  Fall precaution PT-suggest SNF.  * Hypokalemia.  Potassium supplement, follow-up potassium level.  Follow magnesium level.  *Hypocalcemia Replace IV and recheck tomorrow.  * Leukocytosis.  Unclear etiology, follow-up urine  analysis. Spoke to nurse to get the sample collected.  * Hypertension.  Blood pressure is normal.  Continue Lopressor but hold Norvasc.  * COPD.  Stable.  Albuterol as needed.  * Tobacco abuse.  Smoking cessation was counseled for 3 to 4 minutes.  Nicotine patch.     All the records are reviewed and case discussed with Care Management/Social Workerr. Management plans discussed with the patient, family and they are in agreement.  CODE STATUS: Full code  TOTAL TIME TAKING CARE OF THIS PATIENT: 35 minutes.     POSSIBLE D/C IN 1-2 DAYS, DEPENDING ON CLINICAL CONDITION.   Vaughan Basta M.D on 11/17/2018   Between 7am to 6pm - Pager - [phone]  After 6pm go to [email or web address] - password [email or web address] [facility]  Office  [phone]  CC: Primary care physician; Patient, No Pcp Per  Note: This dictation was prepared with Dragon dictation along with smaller phrase technology. Any transcriptional errors that result from this process are unintentional.

## 2018-11-17 NOTE — Consult Note (Signed)
PHARMACY CONSULT NOTE - FOLLOW UP  Pharmacy Consult for Electrolyte Monitoring and Replacement   Recent Labs: Potassium (mmol/L)  Date Value  11/17/2018 3.9  02/23/2013 3.6   Magnesium (mg/dL)  Date Value  11/16/2018 1.9  01/28/2013 2.0   Calcium (mg/dL)  Date Value  11/17/2018 6.1 (LL)   Calcium, Total (mg/dL)  Date Value  02/23/2013 8.4 (L)   Albumin (g/dL)  Date Value  11/15/2018 3.7  01/26/2013 2.5 (L)   Phosphorus (mg/dL)  Date Value  11/17/2018 2.0 (L)  12/14/2012 3.2   Sodium (mmol/L)  Date Value  11/17/2018 138  02/23/2013 130 (L)     Assessment: Phos was 2. Ca 6.1.   Goal of Therapy:  WNL  Plan:  K Phos neutral 2 tabs q4H x 4. Ca gluconate 2 g IV x 1.   Oswald Hillock ,PharmD, BCPS Clinical Pharmacist 11/17/2018 1:42 PM

## 2018-11-17 NOTE — Progress Notes (Signed)
[place] at [place] NAME: Daniel Edwards    MR#:  [id number]  DATE OF BIRTH:  1934-08-08  SUBJECTIVE:  CHIEF COMPLAINT:   Chief Complaint  Patient presents with  . Fall   Came after a fall at home and could not get up.  Noted to have rhabdomyolysis.  Also has some urinary incontinence symptoms. Says that he lives independent and feels slightly better today.  Still were not successful collecting urinalysis sample yesterday.  REVIEW OF SYSTEMS:  CONSTITUTIONAL: No fever, have fatigue or weakness.  EYES: No blurred or double vision.  EARS, NOSE, AND THROAT: No tinnitus or ear pain.  RESPIRATORY: No cough, shortness of breath, wheezing or hemoptysis.  CARDIOVASCULAR: No chest pain, orthopnea, edema.  GASTROINTESTINAL: No nausea, vomiting, diarrhea or abdominal pain.  GENITOURINARY: No dysuria, hematuria.  ENDOCRINE: No polyuria, nocturia,  HEMATOLOGY: No anemia, easy bruising or bleeding SKIN: No rash or lesion. MUSCULOSKELETAL: No joint pain or arthritis.   NEUROLOGIC: No tingling, numbness, weakness.  PSYCHIATRY: No anxiety or depression.   ROS  DRUG ALLERGIES:  No Known Allergies  VITALS:  Blood pressure 112/63, pulse 69, temperature 98 F (36.7 C), temperature source Oral, resp. rate (!) 24, height 5\' 10"  (1.778 m), weight 58.3 kg, SpO2 90 %.  PHYSICAL EXAMINATION:  GENERAL:  84 y.o.-year-old patient lying in the bed with no acute distress.  There is some smell of urine in his room while I entered. EYES: Pupils equal, round, reactive to light and accommodation. No scleral icterus. Extraocular muscles intact.  HEENT: Head atraumatic, normocephalic. Oropharynx and nasopharynx clear.  NECK:  Supple, no jugular venous distention. No thyroid enlargement, no tenderness.  LUNGS: Normal breath sounds bilaterally, no wheezing, rales,rhonchi or crepitation. No use of accessory muscles of respiration.  CARDIOVASCULAR: S1, S2 normal. No  murmurs, rubs, or gallops.  ABDOMEN: Soft, nontender, nondistended. Bowel sounds present. No organomegaly or mass.  EXTREMITIES: No pedal edema, cyanosis, or clubbing.  NEUROLOGIC: Cranial nerves II through XII are intact. Muscle strength 4/5 in all extremities. Sensation intact. Gait not checked.  PSYCHIATRIC: The patient is alert and oriented x 3.  SKIN: No obvious rash, lesion, or ulcer.   Physical Exam LABORATORY PANEL:   CBC Recent Labs  Lab 11/17/18 0602  WBC 10.4  HGB 10.2*  HCT 30.8*  PLT 150   ------------------------------------------------------------------------------------------------------------------  Chemistries  Recent Labs  Lab 11/15/18 1149  11/16/18 0652 11/17/18 0602  NA 135  --  139 138  K 2.9*  --  3.5 3.9  CL 93*  --  103 106  CO2 25  --  25 26  GLUCOSE 101*  --  75 74  BUN 18  --  21 25*  CREATININE 1.03  --  1.01 0.95  CALCIUM 6.7*  --  5.9* 6.1*  MG  --    < > 1.9  --   AST 97*  --   --   --   ALT 26  --   --   --   ALKPHOS 63  --   --   --   BILITOT 1.3*  --   --   --    < > = values in this interval not displayed.   ------------------------------------------------------------------------------------------------------------------  Cardiac Enzymes No results for input(s): TROPONINI in the last 168 hours. ------------------------------------------------------------------------------------------------------------------  RADIOLOGY:  No results found.  ASSESSMENT AND PLAN:   Active Problems:   Rhabdomyolysis  * Rhabdomyolysis due to fall. IV fluid  support and follow-up CK-gradually coming down. No fractures noted on x-rays.  * Elevated troponin, possible due to rhabdomyolysis.  Continue aspirin and follow-up troponin level remained stable.  Echocardiograph-no acute abnormalities.  * Fall and weakness.  Fall precaution PT-suggest SNF. Patient and his son is agreeable for this plan.  * Hypokalemia.  Potassium supplement, follow-up  potassium level.  Follow magnesium level.  *Hypocalcemia Replace IV and recheck tomorrow.  * Leukocytosis.  Unclear etiology, follow-up urine analysis. Spoke to nurse to get the sample collected.  * Hypertension.  Blood pressure is normal.  Continue Lopressor but hold Norvasc.  * COPD.  Stable.  Albuterol as needed.  * Tobacco abuse.  Smoking cessation was counseled for 3 to 4 minutes.  Nicotine patch.     All the records are reviewed and case discussed with Care Management/Social Workerr. Management plans discussed with the patient, family and they are in agreement.  CODE STATUS: Full code  TOTAL TIME TAKING CARE OF THIS PATIENT: 35 minutes.   Patient son was present in the room during my visit.  POSSIBLE D/C IN 1-2 DAYS, DEPENDING ON CLINICAL CONDITION.   Vaughan Basta M.D on 11/17/2018   Between 7am to 6pm - Pager - [phone]  After 6pm go to [email or web address] - password [email or web address] [facility]  Office  [phone]  CC: Primary care physician; Patient, No Pcp Per  Note: This dictation was prepared with Dragon dictation along with smaller phrase technology. Any transcriptional errors that result from this process are unintentional.

## 2018-11-17 NOTE — Plan of Care (Signed)
  Problem: Health Behavior/Discharge Planning: Goal: Ability to manage health-related needs will improve Outcome: Progressing   Problem: Clinical Measurements: Goal: Ability to maintain clinical measurements within normal limits will improve Outcome: Progressing Goal: Will remain free from infection Outcome: Progressing Goal: Diagnostic test results will improve Outcome: Progressing Goal: Respiratory complications will improve Outcome: Progressing Goal: Cardiovascular complication will be avoided Outcome: Progressing   Problem: Activity: Goal: Risk for activity intolerance will decrease Outcome: Progressing   Problem: Nutrition: Goal: Adequate nutrition will be maintained Outcome: Progressing   Problem: Elimination: Goal: Will not experience complications related to bowel motility Outcome: Progressing Goal: Will not experience complications related to urinary retention Outcome: Progressing   Problem: Safety: Goal: Ability to remain free from injury will improve Outcome: Progressing   Problem: Skin Integrity: Goal: Risk for impaired skin integrity will decrease Outcome: Progressing

## 2018-11-17 NOTE — Progress Notes (Signed)
Pt had a run of SVT, pt asleep. No distress noted.

## 2018-11-17 NOTE — Plan of Care (Signed)
  Problem: Clinical Measurements: Goal: Cardiovascular complication will be avoided Outcome: Progressing   Problem: Activity: Goal: Risk for activity intolerance will decrease Outcome: Progressing   Problem: Elimination: Goal: Will not experience complications related to urinary retention Outcome: Progressing   Problem: Safety: Goal: Ability to remain free from injury will improve Outcome: Progressing   Problem: Skin Integrity: Goal: Risk for impaired skin integrity will decrease Outcome: Progressing

## 2018-11-17 NOTE — Progress Notes (Signed)
CRITICAL VALUE STICKER  CRITICAL VALUE: Calcium 6.1  RECEIVER (on-site recipient of call): Jacqualine Mau, RN  DATE & TIME NOTIFIED: 11/17/18 At 0723  MESSENGER (representative from lab):  MD NOTIFIED:  Dr. Anselm Jungling  TIME OF NOTIFICATION: 0728  RESPONSE: no new orders

## 2018-11-18 ENCOUNTER — Inpatient Hospital Stay: Payer: Medicare HMO

## 2018-11-18 LAB — CBC
HCT: 31.9 % — ABNORMAL LOW (ref 39.0–52.0)
Hemoglobin: 10.4 g/dL — ABNORMAL LOW (ref 13.0–17.0)
MCH: 30.3 pg (ref 26.0–34.0)
MCHC: 32.6 g/dL (ref 30.0–36.0)
MCV: 93 fL (ref 80.0–100.0)
Platelets: 161 10*3/uL (ref 150–400)
RBC: 3.43 MIL/uL — ABNORMAL LOW (ref 4.22–5.81)
RDW: 12.8 % (ref 11.5–15.5)
WBC: 19.2 10*3/uL — ABNORMAL HIGH (ref 4.0–10.5)
nRBC: 0 % (ref 0.0–0.2)

## 2018-11-18 LAB — BASIC METABOLIC PANEL
Anion gap: 9 (ref 5–15)
BUN: 19 mg/dL (ref 8–23)
CO2: 22 mmol/L (ref 22–32)
Calcium: 6.2 mg/dL — CL (ref 8.9–10.3)
Chloride: 105 mmol/L (ref 98–111)
Creatinine, Ser: 0.64 mg/dL (ref 0.61–1.24)
GFR calc Af Amer: >60 mL/min (ref >60)
GFR calc non Af Amer: >60 mL/min (ref >60)
Glucose, Bld: 82 mg/dL (ref 70–99)
Potassium: 3.7 mmol/L (ref 3.5–5.1)
Sodium: 136 mmol/L (ref 135–145)

## 2018-11-18 LAB — CK: Total CK: 1518 U/L — ABNORMAL HIGH (ref 49–397)

## 2018-11-18 LAB — PHOSPHORUS: Phosphorus: 3 mg/dL (ref 2.5–4.6)

## 2018-11-18 LAB — MAGNESIUM: Magnesium: 1.5 mg/dL — ABNORMAL LOW (ref 1.7–2.4)

## 2018-11-18 LAB — PROCALCITONIN: Procalcitonin: <0.1 ng/mL

## 2018-11-18 MED ORDER — CALCIUM GLUCONATE-NACL 2-0.675 GM/100ML-% IV SOLN
2.0000 g | Freq: Two times a day (BID) | INTRAVENOUS | Status: AC
  Administered 2018-11-18 (×2): 2000 mg via INTRAVENOUS
  Filled 2018-11-18 (×2): qty 100

## 2018-11-18 MED ORDER — SODIUM CHLORIDE 0.9 % IV SOLN
INTRAVENOUS | Status: DC | PRN
  Administered 2018-11-18 – 2018-11-19 (×2): 250 mL via INTRAVENOUS

## 2018-11-18 MED ORDER — MAGNESIUM SULFATE 2 GM/50ML IV SOLN
2.0000 g | Freq: Once | INTRAVENOUS | Status: AC
  Administered 2018-11-18: 2 g via INTRAVENOUS
  Filled 2018-11-18: qty 50

## 2018-11-18 NOTE — Progress Notes (Signed)
Patient has had four loose stools this a.m. MD notified. MD stated that pt has been getting oral magnesium. Will stop that now and continue to monitor.

## 2018-11-18 NOTE — Plan of Care (Signed)
  Problem: Health Behavior/Discharge Planning: Goal: Ability to manage health-related needs will improve Outcome: Progressing   Problem: Safety: Goal: Ability to remain free from injury will improve Outcome: Progressing   Problem: Skin Integrity: Goal: Risk for impaired skin integrity will decrease Outcome: Progressing   

## 2018-11-18 NOTE — Progress Notes (Signed)
[place] [facility] [place] NAME: Daniel Edwards    MR#:  [id number]  DATE OF BIRTH:  Jun 29, 1934  SUBJECTIVE:  CHIEF COMPLAINT:   Chief Complaint  Patient presents with  . Fall   Came after a fall at home and could not get up.  Noted to have rhabdomyolysis.  Also has some urinary incontinence symptoms. Says that he lives independent and feels slightly better today.   Urinalysis is negative. Patient had 4 watery bowel movements today morning as per nurse.  His white blood cell count is slightly high but no fever.  REVIEW OF SYSTEMS:  CONSTITUTIONAL: No fever, have fatigue or weakness.  EYES: No blurred or double vision.  EARS, NOSE, AND THROAT: No tinnitus or ear pain.  RESPIRATORY: No cough, shortness of breath, wheezing or hemoptysis.  CARDIOVASCULAR: No chest pain, orthopnea, edema.  GASTROINTESTINAL: No nausea, vomiting, diarrhea or abdominal pain.  GENITOURINARY: No dysuria, hematuria.  ENDOCRINE: No polyuria, nocturia,  HEMATOLOGY: No anemia, easy bruising or bleeding SKIN: No rash or lesion. MUSCULOSKELETAL: No joint pain or arthritis.   NEUROLOGIC: No tingling, numbness, weakness.  PSYCHIATRY: No anxiety or depression.   ROS  DRUG ALLERGIES:  No Known Allergies  VITALS:  Blood pressure (!) 147/88, pulse 78, temperature 97.9 F (36.6 C), resp. rate 18, height 5\' 10"  (1.778 m), weight 57.3 kg, SpO2 92 %.  PHYSICAL EXAMINATION:  GENERAL:  84 y.o.-year-old patient lying in the bed with no acute distress.  There is some smell of urine in his room while I entered. EYES: Pupils equal, round, reactive to light and accommodation. No scleral icterus. Extraocular muscles intact.  HEENT: Head atraumatic, normocephalic. Oropharynx and nasopharynx clear.  NECK:  Supple, no jugular venous distention. No thyroid enlargement, no tenderness.  LUNGS: Normal breath sounds bilaterally, no wheezing, rales,rhonchi or crepitation. No use of accessory  muscles of respiration.  CARDIOVASCULAR: S1, S2 normal. No murmurs, rubs, or gallops.  ABDOMEN: Soft, nontender, nondistended. Bowel sounds present. No organomegaly or mass.  EXTREMITIES: No pedal edema, cyanosis, or clubbing.  NEUROLOGIC: Cranial nerves II through XII are intact. Muscle strength 4/5 in all extremities. Sensation intact. Gait not checked.  PSYCHIATRIC: The patient is alert and oriented x 3.  SKIN: No obvious rash, lesion, or ulcer.   Physical Exam LABORATORY PANEL:   CBC Recent Labs  Lab 11/18/18 0542  WBC 19.2*  HGB 10.4*  HCT 31.9*  PLT 161   ------------------------------------------------------------------------------------------------------------------  Chemistries  Recent Labs  Lab 11/15/18 1149  11/18/18 0542  NA 135   < > 136  K 2.9*   < > 3.7  CL 93*   < > 105  CO2 25   < > 22  GLUCOSE 101*   < > 82  BUN 18   < > 19  CREATININE 1.03   < > 0.64  CALCIUM 6.7*   < > 6.2*  MG  --    < > 1.5*  AST 97*  --   --   ALT 26  --   --   ALKPHOS 63  --   --   BILITOT 1.3*  --   --    < > = values in this interval not displayed.   ------------------------------------------------------------------------------------------------------------------  Cardiac Enzymes No results for input(s): TROPONINI in the last 168 hours. ------------------------------------------------------------------------------------------------------------------  RADIOLOGY:  Dg Chest 2 View  Result Date: 11/18/2018 CLINICAL DATA:  Elevated white blood cells. History of COPD. EXAM: CHEST - 2 VIEW  COMPARISON:  July 20, 2015 FINDINGS: Cardiomediastinal silhouette is normal. Mediastinal contours appear intact. Calcific atherosclerotic disease of the aorta. Bilateral small pleural effusions. Coarsening of the interstitium and peribronchial markings, nonspecific. Osseous structures are without acute abnormality. Soft tissues are grossly normal. IMPRESSION: 1. Bilateral small pleural  effusions. 2. Coarsening of the interstitium and peribronchial markings, nonspecific. Differential diagnosis includes interstitial pulmonary edema or atypical pneumonia. Electronically Signed   By: Fidela Salisbury M.D.   On: 11/18/2018 14:00    ASSESSMENT AND PLAN:   Active Problems:   Rhabdomyolysis  * Rhabdomyolysis due to fall. IV fluid support and follow-up CK-gradually coming down. No fractures noted on x-rays. Today I will stop the IV fluid as patient have some finding of suspected pulmonary edema.  * Elevated troponin, possible due to rhabdomyolysis.  Continue aspirin and follow-up troponin level remained stable.  Echocardiograph-no acute abnormalities.  *Diarrhea Patient had 4 watery diarrhea episodes this morning. He was on magnesium oxide twice daily.  I have stopped that and will continue to monitor and check his CBC tomorrow.  *Acute diastolic CHF His echocardiogram does not show any drop in EF. Diffuse appearing pulmonary edema on chest x-ray. I will hold his IV fluid but avoid giving Lasix at this time due to rhabdomyolysis. I have requested to give incentive spirometer to help push fluid out.  * Fall and weakness.  Fall precaution PT-suggest SNF. Patient and his son is agreeable for this plan.  * Hypokalemia.  Potassium supplement, follow-up potassium level.  Follow magnesium level.  *Hypocalcemia Replace IV and recheck tomorrow.  * Leukocytosis.  Unclear etiology, urinalysis is negative. I blood cell count came down some with dilution. Patient have some diarrhea and white cell count is up again but has no fever. I will get chest x-ray and monitor tomorrow on blood cell count.  * Hypertension.  Blood pressure is normal.  Continue Lopressor but hold Norvasc.  * COPD.  Stable.  Albuterol as needed.  * Tobacco abuse.  Smoking cessation was counseled for 3 to 4 minutes.  Nicotine patch.    All the records are reviewed and case discussed with Care  Management/Social Workerr. Management plans discussed with the patient, family and they are in agreement.  CODE STATUS: Full code  TOTAL TIME TAKING CARE OF THIS PATIENT: 35 minutes.   Patient son was present in the room during my visit.  POSSIBLE D/C IN 1-2 DAYS, DEPENDING ON CLINICAL CONDITION.   Vaughan Basta M.D on 11/18/2018   Between 7am to 6pm - Pager - [phone]  After 6pm go to [email or web address] - password [email or web address] [facility]  Office  [phone]  CC: Primary care physician; Patient, No Pcp Per  Note: This dictation was prepared with Dragon dictation along with smaller phrase technology. Any transcriptional errors that result from this process are unintentional.

## 2018-11-18 NOTE — Consult Note (Signed)
PHARMACY CONSULT NOTE - FOLLOW UP  Pharmacy Consult for Electrolyte Monitoring and Replacement   Recent Labs: Potassium (mmol/L)  Date Value  11/18/2018 3.7  02/23/2013 3.6   Magnesium (mg/dL)  Date Value  11/18/2018 1.5 (L)  01/28/2013 2.0   Calcium (mg/dL)  Date Value  11/18/2018 6.2 (LL)   Calcium, Total (mg/dL)  Date Value  02/23/2013 8.4 (L)   Albumin (g/dL)  Date Value  11/15/2018 3.7  01/26/2013 2.5 (L)   Phosphorus (mg/dL)  Date Value  11/18/2018 3.0  12/14/2012 3.2   Sodium (mmol/L)  Date Value  11/18/2018 136  02/23/2013 130 (L)     Assessment: Phos was 2. Ca 6.4. on NS 75 ml/hr.   Goal of Therapy:  WNL  Plan:  MD ordered Mg 2g x 1 and Ca gluconate 2 g IV x 2. Mg oxide PO -d/c'ed  Oswald Hillock ,PharmD, BCPS Clinical Pharmacist 11/18/2018 1:13 PM

## 2018-11-18 NOTE — TOC Initial Note (Signed)
Transition of Care [facility]) - Initial/Assessment Note    Patient Details  Name: Daniel Edwards MRN: [id number] Date of Birth: [birth year]  [facility] CM/SW Contact:    Servando Snare, LCSW Phone Number: 11/18/2018, 12:35 PM  Clinical Narrative:        Patient admitted for fall at home. Patient is from home alone. Prior to fall patients son, Daniel Edwards reports that patient was independent in ADLs including cooking and driving. Patient has a walker but has not used it and has been able to manage in his home. Son reports that patient lives in a small home with a few short stairs to enter the home.   Patient and family are agreeable to SNF at dc. LCSW will fax patient out. Patient will need humana auth prior to dc. Auth will be started by facility.            Expected Discharge Plan: Skilled Nursing Facility Barriers to Discharge: Continued Medical Work up   Patient Goals and CMS Choice Patient states their goals for this hospitalization and ongoing recovery are:: Get stronger and return home CMS Medicare.gov Compare Post Acute Care list provided to:: Patient Choice offered to / list presented to : Patient, Adult Children  Expected Discharge Plan and Services Expected Discharge Plan: [place] In-house Referral: NA Discharge Planning Services: NA Post Acute Care Choice: [place] Living arrangements for the past 2 months: Single Family Home                 DME Arranged: N/A DME Agency: NA       HH Arranged: NA HH Agency: NA        Prior Living Arrangements/Services Living arrangements for the past 2 months: Single Family Home Lives with:: Self Patient language and need for interpreter reviewed:: No Do you feel safe going back to the place where you live?: Yes      Need for Family Participation in Patient Care: Yes (Comment) Care giver support system in place?: Yes (comment)   Criminal Activity/Legal Involvement Pertinent to Current  Situation/Hospitalization: No - Comment as needed  Activities of Daily Living      Permission Sought/Granted Permission sought to share information with : Family Supports Permission granted to share information with : Yes, Verbal Permission Granted  Share Information with NAME: Daniel Edwards and [place] granted to share info w AGENCY: SNF  Permission granted to share info w Relationship: Son and Daughter in Sports coach     Emotional Assessment Appearance:: Appears stated age Attitude/Demeanor/Rapport: Unable to Assess Affect (typically observed): Calm Orientation: : Oriented to Self Alcohol / Substance Use: Not Applicable Psych Involvement: No (comment)  Admission diagnosis:  Elevated troponin [R77.8] Elevated CK [R74.8] Fall, initial encounter [W19.XXXA] Patient Active Problem List   Diagnosis Date Noted  . Rhabdomyolysis 11/15/2018  . A-fib ([place]) 07/20/2015  . Chest pain 02/19/2015  . Atypical chest pain 02/19/2015   PCP:  Patient, No Pcp Per Pharmacy:   [facility], [place] [address] [place] [address] Phone: [phone] Fax: [phone]  [id number] - [place], [place] - [address] [place] [address] Phone: [phone] Fax: [phone]     Social Determinants of Health (SDOH) Interventions    Readmission Risk Interventions Readmission Risk Prevention Plan 11/18/2018  Transportation Screening Complete  Some recent data might be hidden

## 2018-11-18 NOTE — Plan of Care (Signed)
Medication changes made today related to patient's watery stools. Continuing to monitor and encourage fluids and good nutrition.

## 2018-11-18 NOTE — NC FL2 (Addendum)
[place] LEVEL OF CARE SCREENING TOOL     IDENTIFICATION  Patient Name: Daniel Edwards Birthdate: 04/04/1934 Sex: male Admission Date (Current Location): 11/15/2018  [place] and [place] Number:  Engineering geologist and Address:  [facility], [address], [place], [place] [address]      Provider Number: [id number]  Attending Physician Name and Address:  Vaughan Basta, *  Relative Name and Phone Number:       Current Level of Care: Hospital Recommended Level of Care: [place] Prior Approval Number:    Date Approved/Denied:   PASRR Number:   [id number]  Discharge Plan: SNF    Current Diagnoses: Patient Active Problem List   Diagnosis Date Noted  . Rhabdomyolysis 11/15/2018  . A-fib ([place]) 07/20/2015  . Chest pain 02/19/2015  . Atypical chest pain 02/19/2015    Orientation RESPIRATION BLADDER Height & Weight     Self  Normal Incontinent Weight: 126 lb 5.2 oz (57.3 kg) Height:  5\' 10"  (177.8 cm)  BEHAVIORAL SYMPTOMS/MOOD NEUROLOGICAL BOWEL NUTRITION STATUS      Incontinent Diet(See dc summary)  AMBULATORY STATUS COMMUNICATION OF NEEDS Skin   Extensive Assist Verbally Other (Comment)(laceration, elbow)                       Personal Care Assistance Level of Assistance  Bathing, Feeding, Dressing Bathing Assistance: Limited assistance Feeding assistance: Independent Dressing Assistance: Limited assistance     Functional Limitations Info  Sight, Hearing, Speech Sight Info: Impaired Hearing Info: Adequate Speech Info: Adequate    SPECIAL CARE FACTORS FREQUENCY  PT (By licensed PT), OT (By licensed OT)     PT Frequency: 5x/week OT Frequency: 5x/week            Contractures Contractures Info: Present    Additional Factors Info  Code Status, Allergies Code Status Info: Full Allergies Info: NKA           Current Medications (11/18/2018):  This is the current hospital active  medication list Current Facility-Administered Medications  Medication Dose Route Frequency Provider Last Rate Last Dose  . 0.9 %  sodium chloride infusion   Intravenous Continuous Demetrios Loll, MD 75 mL/hr at 11/18/18 0955    . acetaminophen (TYLENOL) tablet 650 mg  650 mg Oral Q6H PRN Demetrios Loll, MD       Or  . acetaminophen (TYLENOL) suppository 650 mg  650 mg Rectal Q6H PRN Demetrios Loll, MD      . albuterol (PROVENTIL) (2.5 MG/3ML) 0.083% nebulizer solution 2.5 mg  2.5 mg Nebulization Q2H PRN Demetrios Loll, MD      . aspirin EC tablet 325 mg  325 mg Oral Daily Demetrios Loll, MD   325 mg at 11/18/18 0951  . calcium gluconate 2 g/ 100 mL sodium chloride IVPB  2 g Intravenous BID Vaughan Basta, MD 100 mL/hr at 11/18/18 0956 2,000 mg at 11/18/18 0956  . enoxaparin (LOVENOX) injection 40 mg  40 mg Subcutaneous Q24H Demetrios Loll, MD   40 mg at 11/17/18 2139  . HYDROcodone-acetaminophen (NORCO/VICODIN) 5-325 MG per tablet 1-2 tablet  1-2 tablet Oral Q4H PRN Demetrios Loll, MD   2 tablet at 11/17/18 0934  . magnesium sulfate IVPB 2 g 50 mL  2 g Intravenous Once Vaughan Basta, MD      . metoprolol tartrate (LOPRESSOR) tablet 25 mg  25 mg Oral BID Demetrios Loll, MD   25 mg at 11/18/18 0951  .  multivitamin with minerals tablet 1 tablet  1 tablet Oral Daily Vaughan Basta, MD   1 tablet at 11/18/18 0951  . nicotine (NICODERM CQ - dosed in mg/24 hours) patch 14 mg  14 mg Transdermal Daily Demetrios Loll, MD   14 mg at 11/18/18 0950  . ondansetron (ZOFRAN) tablet 4 mg  4 mg Oral Q6H PRN Demetrios Loll, MD       Or  . ondansetron [facility]) injection 4 mg  4 mg Intravenous Q6H PRN Demetrios Loll, MD      . pantoprazole (PROTONIX) EC tablet 40 mg  40 mg Oral Daily Demetrios Loll, MD   40 mg at 11/18/18 0951  . tamsulosin (FLOMAX) capsule 0.4 mg  0.4 mg Oral Daily Demetrios Loll, MD   0.4 mg at 11/18/18 0951  . traZODone (DESYREL) tablet 50 mg  50 mg Oral QHS PRN Dustin Flock, MD   50 mg at 11/16/18 2102      Discharge Medications: Please see discharge summary for a list of discharge medications.  Relevant Imaging Results:  Relevant Lab Results:   Additional Information ssn: [id number]  Servando Snare, LCSW

## 2018-11-19 ENCOUNTER — Inpatient Hospital Stay: Payer: Medicare HMO

## 2018-11-19 LAB — MAGNESIUM
Magnesium: 1.5 mg/dL — ABNORMAL LOW (ref 1.7–2.4)
Magnesium: 1.7 mg/dL (ref 1.7–2.4)

## 2018-11-19 LAB — BASIC METABOLIC PANEL
Anion gap: 8 (ref 5–15)
BUN: 20 mg/dL (ref 8–23)
CO2: 24 mmol/L (ref 22–32)
Calcium: 7 mg/dL — ABNORMAL LOW (ref 8.9–10.3)
Chloride: 102 mmol/L (ref 98–111)
Creatinine, Ser: 0.85 mg/dL (ref 0.61–1.24)
GFR calc Af Amer: >60 mL/min (ref >60)
GFR calc non Af Amer: >60 mL/min (ref >60)
Glucose, Bld: 90 mg/dL (ref 70–99)
Potassium: 3.6 mmol/L (ref 3.5–5.1)
Sodium: 134 mmol/L — ABNORMAL LOW (ref 135–145)

## 2018-11-19 LAB — CBC
HCT: 31.7 % — ABNORMAL LOW (ref 39.0–52.0)
Hemoglobin: 10.3 g/dL — ABNORMAL LOW (ref 13.0–17.0)
MCH: 29.6 pg (ref 26.0–34.0)
MCHC: 32.5 g/dL (ref 30.0–36.0)
MCV: 91.1 fL (ref 80.0–100.0)
Platelets: 166 10*3/uL (ref 150–400)
RBC: 3.48 MIL/uL — ABNORMAL LOW (ref 4.22–5.81)
RDW: 13.1 % (ref 11.5–15.5)
WBC: 16.7 10*3/uL — ABNORMAL HIGH (ref 4.0–10.5)
nRBC: 0 % (ref 0.0–0.2)

## 2018-11-19 LAB — URINE CULTURE

## 2018-11-19 LAB — CK: Total CK: 649 U/L — ABNORMAL HIGH (ref 49–397)

## 2018-11-19 LAB — PROCALCITONIN: Procalcitonin: <0.1 ng/mL

## 2018-11-19 MED ORDER — MAGNESIUM SULFATE IN D5W 1-5 GM/100ML-% IV SOLN
1.0000 g | Freq: Once | INTRAVENOUS | Status: AC
  Administered 2018-11-19: 1 g via INTRAVENOUS
  Filled 2018-11-19: qty 100

## 2018-11-19 MED ORDER — CALCIUM GLUCONATE-NACL 2-0.675 GM/100ML-% IV SOLN
2.0000 g | Freq: Once | INTRAVENOUS | Status: AC
  Administered 2018-11-19: 2000 mg via INTRAVENOUS
  Filled 2018-11-19: qty 100

## 2018-11-19 MED ORDER — IPRATROPIUM-ALBUTEROL 0.5-2.5 (3) MG/3ML IN SOLN
3.0000 mL | Freq: Three times a day (TID) | RESPIRATORY_TRACT | Status: DC
  Administered 2018-11-20 – 2018-11-21 (×5): 3 mL via RESPIRATORY_TRACT
  Filled 2018-11-19 (×5): qty 3

## 2018-11-19 MED ORDER — MAGNESIUM SULFATE 2 GM/50ML IV SOLN
2.0000 g | Freq: Once | INTRAVENOUS | Status: AC
  Administered 2018-11-19: 2 g via INTRAVENOUS
  Filled 2018-11-19: qty 50

## 2018-11-19 MED ORDER — MAGNESIUM SULFATE 2 GM/50ML IV SOLN: 2.0000 g | Freq: Once | INTRAVENOUS | Status: DC

## 2018-11-19 MED ORDER — IPRATROPIUM-ALBUTEROL 0.5-2.5 (3) MG/3ML IN SOLN
3.0000 mL | RESPIRATORY_TRACT | Status: DC
  Administered 2018-11-19 (×2): 3 mL via RESPIRATORY_TRACT
  Filled 2018-11-19 (×2): qty 3

## 2018-11-19 NOTE — Consult Note (Signed)
PHARMACY CONSULT NOTE - FOLLOW UP  Pharmacy Consult for Electrolyte Monitoring and Replacement   Recent Labs: Potassium (mmol/L)  Date Value  11/19/2018 3.6  02/23/2013 3.6   Magnesium (mg/dL)  Date Value  11/19/2018 1.7  01/28/2013 2.0   Calcium (mg/dL)  Date Value  11/19/2018 7.0 (L)   Calcium, Total (mg/dL)  Date Value  02/23/2013 8.4 (L)   Albumin (g/dL)  Date Value  11/15/2018 3.7  01/26/2013 2.5 (L)   Phosphorus (mg/dL)  Date Value  11/18/2018 3.0  12/14/2012 3.2   Sodium (mmol/L)  Date Value  11/19/2018 134 (L)  02/23/2013 130 (L)   17 YOM admitted for fall and rhabdomyolysis.  Patient is currently having watery diarrhea.  Assessment: K 3.6 - therapeutic Ca 7.0 (albumin 3.7 WNL on 10/8) - subtherapeutic Magnesium 1.5 -subtherapeutic  Goal of Therapy:  WNL  Plan:  Mg 1.7. Will give mag 1 g x 1 and follow up with am labs.  Tawnya Crook, PharmD Clinical Pharmacist 11/19/2018 6:52 PM

## 2018-11-19 NOTE — TOC Progression Note (Signed)
Transition of Care [facility]) - Progression Note    Patient Details  Name: Daniel Edwards MRN: [id number] Date of Birth: 06/09/1934  Transition of Care [facility]) CM/SW Contact  Ross Ludwig, [place] Phone Number: 11/19/2018, 11:59 AM  Clinical Narrative:    Patient's son was presented bed offers and he chose [facility] for short term rehab.  CSW spoke to [place] at [facility], and she will start insurance authorization once PT has an updated note.  CSW to continue to follow patient's progress throughout discharge planning.   Expected Discharge Plan: [place] Barriers to Discharge: Continued Medical Work up  Expected Discharge Plan and Services Expected Discharge Plan: [place] In-house Referral: NA Discharge Planning Services: NA Post Acute Care Choice: [place] Living arrangements for the past 2 months: Single Family Home                 DME Arranged: N/A DME Agency: NA       HH Arranged: NA HH Agency: NA         Social Determinants of Health (SDOH) Interventions    Readmission Risk Interventions Readmission Risk Prevention Plan 11/18/2018  Transportation Screening Complete  Some recent data might be hidden

## 2018-11-19 NOTE — Consult Note (Addendum)
PHARMACY CONSULT NOTE - FOLLOW UP  Pharmacy Consult for Electrolyte Monitoring and Replacement   Recent Labs: Potassium (mmol/L)  Date Value  11/19/2018 3.6  02/23/2013 3.6   Magnesium (mg/dL)  Date Value  11/19/2018 1.5 (L)  01/28/2013 2.0   Calcium (mg/dL)  Date Value  11/19/2018 7.0 (L)   Calcium, Total (mg/dL)  Date Value  02/23/2013 8.4 (L)   Albumin (g/dL)  Date Value  11/15/2018 3.7  01/26/2013 2.5 (L)   Phosphorus (mg/dL)  Date Value  11/18/2018 3.0  12/14/2012 3.2   Sodium (mmol/L)  Date Value  11/19/2018 134 (L)  02/23/2013 130 (L)   84 YOM admitted for fall and rhabdomyolysis.  Patient is currently having watery diarrhea.  Assessment: K 3.6 - therapeutic Ca 7.0 (albumin 3.7 WNL on 10/8) - subtherapeutic Magnesium 1.5 -subtherapeutic  Goal of Therapy:  WNL  Plan:  -Will give calcium gluconate IV 2 g x 1 dose -Will give magnesium sulfate IV 2 g x 1 dose -Will check magnesium at 1800, and check phosphorus, magnesium and other electrolytes with AM labs.  Gerald [place], PharmD Pharmacy Resident  11/19/2018 7:57 AM

## 2018-11-19 NOTE — Progress Notes (Signed)
[facility] [place] NAME: Daniel Edwards    MR#:  [id number]  DATE OF BIRTH:  13-Mar-1934  SUBJECTIVE:  CHIEF COMPLAINT:   Chief Complaint  Patient presents with  . Fall   Came after a fall at home and could not get up.  Noted to have rhabdomyolysis.  Also has some urinary incontinence symptoms. Says that he lives independent and feels slightly better today.   Urinalysis is negative. Patient had 4 watery bowel movements yesterday but after stopping magnesium he does not have diarrhea he had regular bowel movement today. He is on oxygen now but denies any cough or fever.  White cell count is slightly lower today.  REVIEW OF SYSTEMS:  CONSTITUTIONAL: No fever, have fatigue or weakness.  EYES: No blurred or double vision.  EARS, NOSE, AND THROAT: No tinnitus or ear pain.  RESPIRATORY: No cough, shortness of breath, wheezing or hemoptysis.  CARDIOVASCULAR: No chest pain, orthopnea, edema.  GASTROINTESTINAL: No nausea, vomiting, diarrhea or abdominal pain.  GENITOURINARY: No dysuria, hematuria.  ENDOCRINE: No polyuria, nocturia,  HEMATOLOGY: No anemia, easy bruising or bleeding SKIN: No rash or lesion. MUSCULOSKELETAL: No joint pain or arthritis.   NEUROLOGIC: No tingling, numbness, weakness.  PSYCHIATRY: No anxiety or depression.   ROS  DRUG ALLERGIES:  No Known Allergies  VITALS:  Blood pressure (!) 154/80, pulse 76, temperature 98 F (36.7 C), temperature source Oral, resp. rate 19, height 5\' 10"  (1.778 m), weight 57.3 kg, SpO2 93 %.  PHYSICAL EXAMINATION:  GENERAL:  84 y.o.-year-old patient lying in the bed with no acute distress.  There is some smell of urine in his room while I entered. EYES: Pupils equal, round, reactive to light and accommodation. No scleral icterus. Extraocular muscles intact.  HEENT: Head atraumatic, normocephalic. Oropharynx and nasopharynx clear.  NECK:  Supple, no jugular venous distention. No thyroid  enlargement, no tenderness.  LUNGS: Normal breath sounds bilaterally, no wheezing, have some bilateral crepitation. No use of accessory muscles of respiration.  On oxygen via nasal cannula. CARDIOVASCULAR: S1, S2 normal. No murmurs, rubs, or gallops.  ABDOMEN: Soft, nontender, nondistended. Bowel sounds present. No organomegaly or mass.  EXTREMITIES: No pedal edema, cyanosis, or clubbing.  NEUROLOGIC: Cranial nerves II through XII are intact. Muscle strength 4/5 in all extremities. Sensation intact. Gait not checked.  PSYCHIATRIC: The patient is alert and oriented x 3.  SKIN: No obvious rash, lesion, or ulcer.   Physical Exam LABORATORY PANEL:   CBC Recent Labs  Lab 11/19/18 0417  WBC 16.7*  HGB 10.3*  HCT 31.7*  PLT 166   ------------------------------------------------------------------------------------------------------------------  Chemistries  Recent Labs  Lab 11/15/18 1149  11/19/18 0417  NA 135   < > 134*  K 2.9*   < > 3.6  CL 93*   < > 102  CO2 25   < > 24  GLUCOSE 101*   < > 90  BUN 18   < > 20  CREATININE 1.03   < > 0.85  CALCIUM 6.7*   < > 7.0*  MG  --    < > 1.5*  AST 97*  --   --   ALT 26  --   --   ALKPHOS 63  --   --   BILITOT 1.3*  --   --    < > = values in this interval not displayed.   ------------------------------------------------------------------------------------------------------------------  Cardiac Enzymes No results for input(s): TROPONINI in the last 168 hours. ------------------------------------------------------------------------------------------------------------------  RADIOLOGY:  Dg Chest 2 View  Result Date: 11/19/2018 CLINICAL DATA:  Hypoxia, COPD EXAM: CHEST - 2 VIEW COMPARISON:  11/18/2018 FINDINGS: There is hyperinflation of the lungs compatible with COPD. Stable increased markings, particularly at the lung bases, favor chronic lung disease. Small layering effusions noted on the lateral view. Heart is normal size. No  acute bony abnormality. IMPRESSION: Small bilateral pleural effusions. COPD/chronic changes. Electronically Signed   By: Rolm Baptise M.D.   On: 11/19/2018 09:31   Dg Chest 2 View  Result Date: 11/18/2018 CLINICAL DATA:  Elevated white blood cells. History of COPD. EXAM: CHEST - 2 VIEW COMPARISON:  July 20, 2015 FINDINGS: Cardiomediastinal silhouette is normal. Mediastinal contours appear intact. Calcific atherosclerotic disease of the aorta. Bilateral small pleural effusions. Coarsening of the interstitium and peribronchial markings, nonspecific. Osseous structures are without acute abnormality. Soft tissues are grossly normal. IMPRESSION: 1. Bilateral small pleural effusions. 2. Coarsening of the interstitium and peribronchial markings, nonspecific. Differential diagnosis includes interstitial pulmonary edema or atypical pneumonia. Electronically Signed   By: Fidela Salisbury M.D.   On: 11/18/2018 14:00    ASSESSMENT AND PLAN:   Active Problems:   Rhabdomyolysis  * Rhabdomyolysis due to fall. IV fluid support and follow-up CK-gradually coming down. No fractures noted on x-rays. CK level came down to 649.  * Elevated troponin, possible due to rhabdomyolysis.  Continue aspirin and follow-up troponin level remained stable.  Echocardiograph-no acute abnormalities.  *Diarrhea Patient had 4 watery diarrhea episodes 11/18/18 morning. He was on magnesium oxide twice daily.  I have stopped that and will continue to monitor , no fever, diarrhea resolved, white blood cell count is slightly lower today.  *Acute diastolic CHF His echocardiogram does not show any drop in EF. Diffuse appearing pulmonary edema on chest x-ray. I will hold his IV fluid but avoid giving Lasix at this time due to rhabdomyolysis. I have requested to give incentive spirometer to help push fluid out. His procalcitonin level is low.  * Fall and weakness.  Fall precaution PT-suggest SNF. Patient and his son is agreeable  for this plan.  * Hypokalemia.  Potassium supplement, follow-up potassium level.  Follow magnesium level.  *Hypocalcemia Replace IV and recheck tomorrow. Replace magnesium also.  Managed by pharmacist.  * Leukocytosis.  Unclear etiology, urinalysis is negative. I blood cell count came down some with dilution. Patient have some diarrhea and white cell count is up again but has no fever. I will get chest x-ray and monitor tomorrow on blood cell count.  * Hypertension.  Blood pressure is normal.  Continue Lopressor but hold Norvasc.  * COPD.  Stable.  Albuterol as needed.  * Tobacco abuse.  Smoking cessation was counseled for 3 to 4 minutes.  Nicotine patch.    All the records are reviewed and case discussed with Care Management/Social Workerr. Management plans discussed with the patient, family and they are in agreement.  CODE STATUS: Full code  TOTAL TIME TAKING CARE OF THIS PATIENT: 35 minutes.    POSSIBLE D/C IN 1-2 DAYS, DEPENDING ON CLINICAL CONDITION.   Vaughan Basta M.D on 11/19/2018   Between 7am to 6pm - Pager - [phone]  After 6pm go to [email or web address] - password [email or web address] [facility]  Office  [phone]  CC: Primary care physician; Patient, No Pcp Per  Note: This dictation was prepared with Dragon dictation along with smaller phrase technology. Any transcriptional errors that result from this process are unintentional.

## 2018-11-19 NOTE — Progress Notes (Signed)
Physical Therapy Treatment Patient Details Name: Daniel Edwards MRN: [id number] DOB: 1934-10-23 Today's Date: 11/19/2018    History of Present Illness presented to ER secondary to fall in home environment, in floor x12 hours prior to assist; admitted for management of acute rhabdomyolysis.  Imaging (CTH, c-spine, bilat elbows, bilat knees, R hand/forearm/shoulder) negative for acute injury.    PT Comments    Pt agreeable to PT; denies pain. Pt demonstrates good ability to follow instruction and participate in supine/seated exercises. Mod I for supine to sit bed mobility. Continues to require Mod A STS transfers with rolling walker; poor ability to extend hip and knees fully with some improvement on cues, but poor endurance. Upon stand therapist realized pt bed/self soiled. Nursing assistant called. Pt able to maintain static stance working on hip/knee extension with Min guard. Pt noted to continue with small amount of loose bowel movement; pt was unaware of any bowel incontinence while in supine or stance. Post personal hygiene, pt able to continue with bed to chair ambulation requiring Min A and directional cues. Small/shaky steps and Mod A to lower to chair. Encouraged continued work on exercises as instructed. Pt wishes to return home; however, skilled nursing facility continues to be recommendation due to significant decreased ability to ambulate and increased risk of falls.    Follow Up Recommendations  SNF     Equipment Recommendations  Rolling walker with 5" wheels;3in1 (PT)    Recommendations for Other Services       Precautions / Restrictions Precautions Precautions: Fall Restrictions Weight Bearing Restrictions: No    Mobility  Bed Mobility Overal bed mobility: Modified Independent Bed Mobility: Supine to Sit     Supine to sit: Modified independent (Device/Increase time)     General bed mobility comments: Increased time/effort  Transfers Overall transfer level:  Needs assistance Equipment used: Rolling walker (2 wheeled) Transfers: Sit to/from Stand Sit to Stand: Mod assist         General transfer comment: Difficulty extending hip/knees to attain stand. Once in stand, knees remain soft and mildly unsteady  Ambulation/Gait Ambulation/Gait assistance: Min assist Gait Distance (Feet): 3 Feet Assistive device: Rolling walker (2 wheeled)       General Gait Details: Mildly unsteady and requires directional cues bed to chair   Stairs             Wheelchair Mobility    Modified Rankin (Stroke Patients Only)       Balance Overall balance assessment: Needs assistance Sitting-balance support: Bilateral upper extremity supported;Feet supported Sitting balance-Leahy Scale: Good     Standing balance support: Bilateral upper extremity supported Standing balance-Leahy Scale: Poor                              Cognition Arousal/Alertness: Awake/alert Behavior During Therapy: Impulsive Overall Cognitive Status: No family/caregiver present to determine baseline cognitive functioning                                 General Comments: Responds that he understands instruction; yet does not follow instruction to wait for assist with movement. Pt also unaware of incontinence of bowel      Exercises General Exercises - Lower Extremity Ankle Circles/Pumps: AROM;Both;10 reps Quad Sets: Strengthening;Both;10 reps Gluteal Sets: Strengthening;Both;10 reps Long Arc Quad: AROM;Both;10 reps(not full range) Hip Flexion/Marching: AROM;Both;10 reps Other Exercises Other Exercises: static stance tolerance with  Min guard to [employer] A for personal hygiene x 5 min    General Comments        Pertinent Vitals/Pain Pain Assessment: No/denies pain    Home Living                      Prior Function            PT Goals (current goals can now be found in the care plan section) Progress towards PT goals:  Progressing toward goals    Frequency    Min 2X/week      PT Plan Current plan remains appropriate    Co-evaluation              AM-PAC PT "6 Clicks" Mobility   Outcome Measure  Help needed turning from your back to your side while in a flat bed without using bedrails?: A Little Help needed moving from lying on your back to sitting on the side of a flat bed without using bedrails?: None Help needed moving to and from a bed to a chair (including a wheelchair)?: A Little Help needed standing up from a chair using your arms (e.g., wheelchair or bedside chair)?: A Lot Help needed to walk in hospital room?: A Lot Help needed climbing 3-5 steps with a railing? : Total 6 Click Score: 15    End of Session   Activity Tolerance: Patient tolerated treatment well Patient left: in chair;with call bell/phone within reach;with chair alarm set   PT Visit Diagnosis: Muscle weakness (generalized) (M62.81);Difficulty in walking, not elsewhere classified (R26.2);History of falling (Z91.81);Unsteadiness on feet (R26.81)     Time: [id number] PT Time Calculation (min) (ACUTE ONLY): 40 min  Charges:  $Gait Training: 8-22 mins $Therapeutic Exercise: 8-22 mins $Therapeutic Activity: 8-22 mins                      Larae Grooms, PTA 11/19/2018, 3:37 PM

## 2018-11-19 NOTE — Care Management Important Message (Signed)
Important Message  Patient Details  Name: Daniel Edwards MRN: [id number] Date of Birth: 10-14-1934   Medicare Important Message Given:  Yes     [name] 11/19/2018, 1:42 PM

## 2018-11-20 LAB — COMPREHENSIVE METABOLIC PANEL
ALT: 31 U/L (ref 0–44)
AST: 44 U/L — ABNORMAL HIGH (ref 15–41)
Albumin: 2.8 g/dL — ABNORMAL LOW (ref 3.5–5.0)
Alkaline Phosphatase: 57 U/L (ref 38–126)
Anion gap: 10 (ref 5–15)
BUN: 14 mg/dL (ref 8–23)
CO2: 26 mmol/L (ref 22–32)
Calcium: 7.7 mg/dL — ABNORMAL LOW (ref 8.9–10.3)
Chloride: 99 mmol/L (ref 98–111)
Creatinine, Ser: 0.66 mg/dL (ref 0.61–1.24)
GFR calc Af Amer: >60 mL/min (ref >60)
GFR calc non Af Amer: >60 mL/min (ref >60)
Glucose, Bld: 102 mg/dL — ABNORMAL HIGH (ref 70–99)
Potassium: 3.4 mmol/L — ABNORMAL LOW (ref 3.5–5.1)
Sodium: 135 mmol/L (ref 135–145)
Total Bilirubin: 0.5 mg/dL (ref 0.3–1.2)
Total Protein: 5.7 g/dL — ABNORMAL LOW (ref 6.5–8.1)

## 2018-11-20 LAB — CBC
HCT: 32.7 % — ABNORMAL LOW (ref 39.0–52.0)
Hemoglobin: 10.9 g/dL — ABNORMAL LOW (ref 13.0–17.0)
MCH: 30 pg (ref 26.0–34.0)
MCHC: 33.3 g/dL (ref 30.0–36.0)
MCV: 90.1 fL (ref 80.0–100.0)
Platelets: 185 10*3/uL (ref 150–400)
RBC: 3.63 MIL/uL — ABNORMAL LOW (ref 4.22–5.81)
RDW: 12.8 % (ref 11.5–15.5)
WBC: 13.8 10*3/uL — ABNORMAL HIGH (ref 4.0–10.5)
nRBC: 0 % (ref 0.0–0.2)

## 2018-11-20 LAB — MAGNESIUM: Magnesium: 1.7 mg/dL (ref 1.7–2.4)

## 2018-11-20 LAB — PHOSPHORUS: Phosphorus: 2.3 mg/dL — ABNORMAL LOW (ref 2.5–4.6)

## 2018-11-20 LAB — SARS CORONAVIRUS 2 (TAT 6-24 HRS): SARS Coronavirus 2: NEGATIVE

## 2018-11-20 LAB — PROCALCITONIN: Procalcitonin: <0.1 ng/mL

## 2018-11-20 MED ORDER — POTASSIUM CHLORIDE 20 MEQ PO PACK
20.0000 meq | PACK | Freq: Once | ORAL | Status: AC
  Administered 2018-11-20: 14:00:00 20 meq via ORAL
  Filled 2018-11-20: qty 1

## 2018-11-20 MED ORDER — POTASSIUM PHOSPHATE MONOBASIC 500 MG PO TABS
500.0000 mg | ORAL_TABLET | Freq: Two times a day (BID) | ORAL | Status: AC
  Administered 2018-11-20 (×2): 500 mg via ORAL
  Filled 2018-11-20 (×2): qty 1

## 2018-11-20 MED ORDER — FUROSEMIDE 20 MG PO TABS
20.0000 mg | ORAL_TABLET | Freq: Once | ORAL | Status: AC
  Administered 2018-11-20: 20 mg via ORAL
  Filled 2018-11-20: qty 1

## 2018-11-20 MED ORDER — MAGNESIUM SULFATE 2 GM/50ML IV SOLN
2.0000 g | Freq: Once | INTRAVENOUS | Status: AC
  Administered 2018-11-20: 09:00:00 2 g via INTRAVENOUS
  Filled 2018-11-20: qty 50

## 2018-11-20 NOTE — Progress Notes (Signed)
Physical Therapy Treatment Patient Details Name: Daniel Edwards MRN: [id number] DOB: Oct 19, 1934 Today's Date: 11/20/2018    History of Present Illness 84 y/o male presented to ER secondary to fall in home environment, in floor x12 hours prior to assist; admitted for management of acute rhabdomyolysis.  Imaging (CTH, c-spine, bilat elbows, bilat knees, R hand/forearm/shoulder) negative for acute injury.    PT Comments    Pt was able to ambulate further this PT session than the previous however he had consistent stagger stepping, knee buckling and though he acknowledged that he was at times unsafe did not seem to realize just how unsteady he was.  He also was unaware of his bladder incontinence that became quite obvious when he was getting up out of bed.  Pt still eager to go home, but does realize that he would not be safe at home and needs some time in rehab to build strength and get back to a more functional and independent level.    Follow Up Recommendations  SNF     Equipment Recommendations  Rolling walker with 5" wheels;3in1 (PT)    Recommendations for Other Services       Precautions / Restrictions Precautions Precautions: Fall Restrictions Weight Bearing Restrictions: No    Mobility  Bed Mobility Overal bed mobility: Modified Independent Bed Mobility: Supine to Sit     Supine to sit: Modified independent (Device/Increase time)        Transfers Overall transfer level: Needs assistance Equipment used: Rolling walker (2 wheeled) Transfers: Sit to/from Stand Sit to Stand: Min assist;Min guard         General transfer comment: Pt unable to rise on first attempt without cuing, with set up and appropriate use of UEs able to rise with only CGA  Ambulation/Gait Ambulation/Gait assistance: Mod assist;Min assist Gait Distance (Feet): 50 Feet Assistive device: Rolling walker (2 wheeled)       General Gait Details: 2 bouts of ambulation, 40 ft then 50 ft after  seated rest break.  Pt's O2 dropped to high 80s on room air on first attempt, stayed in low 90s on 3L on second attempt.  Pt with multiple repeated buckling bouts in the knees and significant unsteadiness t/o effort expecially with turns and changes in direction   Stairs             Wheelchair Mobility    Modified Rankin (Stroke Patients Only)       Balance Overall balance assessment: Needs assistance Sitting-balance support: Bilateral upper extremity supported;Feet supported Sitting balance-Leahy Scale: Good     Standing balance support: Bilateral upper extremity supported Standing balance-Leahy Scale: Poor Standing balance comment: Reliant on walker, stagger stepping and knee bukcling t/o standing tasks                            Cognition Arousal/Alertness: Awake/alert Behavior During Therapy: Impulsive Overall Cognitive Status: Difficult to assess                                 General Comments: Pt unaware of incontinency of bladder, at time impulsive and seemingly unaware of limitations      Exercises General Exercises - Lower Extremity Ankle Circles/Pumps: AROM;Both;10 reps Long Arc Quad: Strengthening;10 reps Heel Slides: Strengthening;10 reps Hip ABduction/ADduction: Strengthening;10 reps Hip Flexion/Marching: Strengthening;10 reps    General Comments        Pertinent Vitals/Pain  Pain Assessment: No/denies pain    Home Living Family/patient expects to be discharged to:: Private residence                    Prior Function            PT Goals (current goals can now be found in the care plan section) Progress towards PT goals: Progressing toward goals    Frequency    Min 2X/week      PT Plan Current plan remains appropriate    Co-evaluation              AM-PAC PT "6 Clicks" Mobility   Outcome Measure  Help needed turning from your back to your side while in a flat bed without using bedrails?:  None Help needed moving from lying on your back to sitting on the side of a flat bed without using bedrails?: A Little Help needed moving to and from a bed to a chair (including a wheelchair)?: A Little Help needed standing up from a chair using your arms (e.g., wheelchair or bedside chair)?: A Little Help needed to walk in hospital room?: A Lot Help needed climbing 3-5 steps with a railing? : A Lot 6 Click Score: 17    End of Session Equipment Utilized During Treatment: Gait belt Activity Tolerance: Patient tolerated treatment well;Patient limited by fatigue Patient left: in chair;with call bell/phone within reach;with chair alarm set Nurse Communication: Mobility status PT Visit Diagnosis: Muscle weakness (generalized) (M62.81);Difficulty in walking, not elsewhere classified (R26.2);History of falling (Z91.81);Unsteadiness on feet (R26.81)     Time: [id number] PT Time Calculation (min) (ACUTE ONLY): 29 min  Charges:  $Gait Training: 8-22 mins $Therapeutic Exercise: 8-22 mins                     Kreg Shropshire, DPT 11/20/2018, 5:23 PM

## 2018-11-20 NOTE — Consult Note (Signed)
PHARMACY CONSULT NOTE - FOLLOW UP  Pharmacy Consult for Electrolyte Monitoring and Replacement   Recent Labs: Potassium (mmol/L)  Date Value  11/20/2018 3.4 (L)  02/23/2013 3.6   Magnesium (mg/dL)  Date Value  11/20/2018 1.7  01/28/2013 2.0   Calcium (mg/dL)  Date Value  11/20/2018 7.7 (L)   Calcium, Total (mg/dL)  Date Value  02/23/2013 8.4 (L)   Albumin (g/dL)  Date Value  11/20/2018 2.8 (L)  01/26/2013 2.5 (L)   Phosphorus (mg/dL)  Date Value  11/20/2018 2.3 (L)  12/14/2012 3.2   Sodium (mmol/L)  Date Value  11/20/2018 135  02/23/2013 130 (L)   84 YOM admitted for fall and rhabdomyolysis.  Spoke with patient, who reported having a small amount of diarrhea yesterday 10/12.  Assessment: K 3.4 - subtherapeutic Corrected Ca 8.6 (albumin 2.8) - slightly subtherapeutic Magnesium 1.7 - therapeutic Phosphorus 2.3  Goal of Therapy:  WNL  Plan:  Will give Kphos 2 tab PO x 2 doses Will give Klorcon 20 mEq x 1 dose Mg 1.7. Will give mag IV 2 g x 1 dose Will continue to monitor calcium and not replace at this time. Will follow up mag, phosphorus and other electrolytes with AM labs  Gerald Dexter, PharmD Pharmacy Resident  11/20/2018 8:15 AM

## 2018-11-20 NOTE — Progress Notes (Signed)
[facility] NAME: Daniel Edwards    MR#:  [id number]  DATE OF BIRTH:  [birth year]  SUBJECTIVE:  CHIEF COMPLAINT:   Chief Complaint  Patient presents with  . Fall   Came after a fall at home and could not get up.  Noted to have rhabdomyolysis.  Also has some urinary incontinence symptoms. Says that he lives independent and feels slightly better today.   Urinalysis is negative. Patient had 4 watery bowel movements yesterday but after stopping magnesium he does not have diarrhea he had regular bowel movement today. He is on oxygen now but denies any cough or fever.  White cell count is slightly lower today.  REVIEW OF SYSTEMS:  CONSTITUTIONAL: No fever, have fatigue or weakness.  EYES: No blurred or double vision.  EARS, NOSE, AND THROAT: No tinnitus or ear pain.  RESPIRATORY: No cough, shortness of breath, wheezing or hemoptysis.  CARDIOVASCULAR: No chest pain, orthopnea, edema.  GASTROINTESTINAL: No nausea, vomiting, diarrhea or abdominal pain.  GENITOURINARY: No dysuria, hematuria.  ENDOCRINE: No polyuria, nocturia,  HEMATOLOGY: No anemia, easy bruising or bleeding SKIN: No rash or lesion. MUSCULOSKELETAL: No joint pain or arthritis.   NEUROLOGIC: No tingling, numbness, weakness.  PSYCHIATRY: No anxiety or depression.   ROS  DRUG ALLERGIES:  No Known Allergies  VITALS:  Blood pressure (!) 157/82, pulse 89, temperature 97.9 F (36.6 C), temperature source Oral, resp. rate 18, height 5\' 10"  (1.778 m), weight 62.3 kg, SpO2 95 %.  PHYSICAL EXAMINATION:  GENERAL:  84 y.o.-year-old patient lying in the bed with no acute distress.  There is some smell of urine in his room while I entered. EYES: Pupils equal, round, reactive to light and accommodation. No scleral icterus. Extraocular muscles intact.  HEENT: Head atraumatic, normocephalic. Oropharynx and nasopharynx clear.  NECK:  Supple, no jugular venous distention. No thyroid  enlargement, no tenderness.  LUNGS: Normal breath sounds bilaterally, no wheezing, have some bilateral crepitation. No use of accessory muscles of respiration.  On oxygen via nasal cannula. CARDIOVASCULAR: S1, S2 normal. No murmurs, rubs, or gallops.  ABDOMEN: Soft, nontender, nondistended. Bowel sounds present. No organomegaly or mass.  EXTREMITIES: No pedal edema, cyanosis, or clubbing.  NEUROLOGIC: Cranial nerves II through XII are intact. Muscle strength 4/5 in all extremities. Sensation intact. Gait not checked.  PSYCHIATRIC: The patient is alert and oriented x 3.  SKIN: No obvious rash, lesion, or ulcer.   Physical Exam LABORATORY PANEL:   CBC Recent Labs  Lab 11/20/18 0416  WBC 13.8*  HGB 10.9*  HCT 32.7*  PLT 185   ------------------------------------------------------------------------------------------------------------------  Chemistries  Recent Labs  Lab 11/20/18 0416  NA 135  K 3.4*  CL 99  CO2 26  GLUCOSE 102*  BUN 14  CREATININE 0.66  CALCIUM 7.7*  MG 1.7  AST 44*  ALT 31  ALKPHOS 57  BILITOT 0.5   ------------------------------------------------------------------------------------------------------------------  Cardiac Enzymes No results for input(s): TROPONINI in the last 168 hours. ------------------------------------------------------------------------------------------------------------------  RADIOLOGY:  Dg Chest 2 View  Result Date: 11/19/2018 CLINICAL DATA:  Hypoxia, COPD EXAM: CHEST - 2 VIEW COMPARISON:  11/18/2018 FINDINGS: There is hyperinflation of the lungs compatible with COPD. Stable increased markings, particularly at the lung bases, favor chronic lung disease. Small layering effusions noted on the lateral view. Heart is normal size. No acute bony abnormality. IMPRESSION: Small bilateral pleural effusions. COPD/chronic changes. Electronically Signed   By: Rolm Baptise M.D.   On: 11/19/2018 09:31  ASSESSMENT AND PLAN:   Active  Problems:   Rhabdomyolysis  * Rhabdomyolysis due to fall. IV fluid support and follow-up CK-gradually coming down. No fractures noted on x-rays. CK level came down to 649.  * Elevated troponin, possible due to rhabdomyolysis.  Continue aspirin and follow-up troponin level remained stable.  Echocardiograph-no acute abnormalities.  *Diarrhea Patient had 4 watery diarrhea episodes 11/18/18 morning. He was on magnesium oxide twice daily.  I have stopped that and will continue to monitor , no fever, diarrhea resolved, white blood cell count is slightly lower today.  *Acute diastolic CHF His echocardiogram does not show any drop in EF. Diffuse appearing pulmonary edema on chest x-ray. I will hold his IV fluid but avoid giving Lasix at this time due to rhabdomyolysis. I have requested to give incentive spirometer to help push fluid out. His procalcitonin level is low. I will give oral Lasix one-time dose now.  * Fall and weakness.  Fall precaution PT-suggest SNF. Patient and his son is agreeable for this plan.  * Hypokalemia.  Potassium supplement, follow-up potassium level.  Follow magnesium level.  *Hypocalcemia Replace IV and recheck tomorrow. Replace magnesium also.  Managed by pharmacist.  * Leukocytosis.  Unclear etiology, urinalysis is negative. I blood cell count came down some with dilution. Patient have some diarrhea and white cell count is up again but has no fever. I will get chest x-ray and monitor tomorrow on blood cell count.  * Hypertension.  Blood pressure is normal.  Continue Lopressor but hold Norvasc.  * COPD.  Stable.  Albuterol as needed.  * Tobacco abuse.  Smoking cessation was counseled for 3 to 4 minutes.  Nicotine patch.    All the records are reviewed and case discussed with Care Management/Social Workerr. Management plans discussed with the patient, family and they are in agreement.  CODE STATUS: Full code  TOTAL TIME TAKING CARE OF THIS  PATIENT: 35 minutes.  Awaited approval from insurance for placement to rehab.  POSSIBLE D/C IN 1-2 DAYS, DEPENDING ON CLINICAL CONDITION.   Vaughan Basta M.D on 11/20/2018   Between 7am to 6pm - Pager - [phone]  After 6pm go to [email or web address] - password [email or web address] [facility]  Office  [phone]  CC: Primary care physician; Patient, No Pcp Per  Note: This dictation was prepared with Dragon dictation along with smaller phrase technology. Any transcriptional errors that result from this process are unintentional.

## 2018-11-20 NOTE — Plan of Care (Signed)
  Problem: Activity: Goal: Risk for activity intolerance will decrease Outcome: Progressing   Problem: Nutrition: Goal: Adequate nutrition will be maintained Outcome: Progressing   Problem: Safety: Goal: Ability to remain free from injury will improve Outcome: Progressing   Problem: Skin Integrity: Goal: Risk for impaired skin integrity will decrease Outcome: Progressing   

## 2018-11-21 DIAGNOSIS — E0590 Thyrotoxicosis, unspecified without thyrotoxic crisis or storm: Secondary | ICD-10-CM | POA: Diagnosis not present

## 2018-11-21 DIAGNOSIS — Z7401 Bed confinement status: Secondary | ICD-10-CM | POA: Diagnosis not present

## 2018-11-21 DIAGNOSIS — F17210 Nicotine dependence, cigarettes, uncomplicated: Secondary | ICD-10-CM | POA: Diagnosis not present

## 2018-11-21 DIAGNOSIS — J410 Simple chronic bronchitis: Secondary | ICD-10-CM | POA: Diagnosis not present

## 2018-11-21 DIAGNOSIS — M6281 Muscle weakness (generalized): Secondary | ICD-10-CM | POA: Diagnosis not present

## 2018-11-21 DIAGNOSIS — J449 Chronic obstructive pulmonary disease, unspecified: Secondary | ICD-10-CM | POA: Diagnosis not present

## 2018-11-21 DIAGNOSIS — R778 Other specified abnormalities of plasma proteins: Secondary | ICD-10-CM | POA: Diagnosis not present

## 2018-11-21 DIAGNOSIS — I10 Essential (primary) hypertension: Secondary | ICD-10-CM | POA: Diagnosis not present

## 2018-11-21 DIAGNOSIS — T796XXD Traumatic ischemia of muscle, subsequent encounter: Secondary | ICD-10-CM | POA: Diagnosis not present

## 2018-11-21 DIAGNOSIS — I4821 Permanent atrial fibrillation: Secondary | ICD-10-CM | POA: Diagnosis not present

## 2018-11-21 DIAGNOSIS — R509 Fever, unspecified: Secondary | ICD-10-CM | POA: Diagnosis not present

## 2018-11-21 DIAGNOSIS — M2550 Pain in unspecified joint: Secondary | ICD-10-CM | POA: Diagnosis not present

## 2018-11-21 DIAGNOSIS — W19XXXA Unspecified fall, initial encounter: Secondary | ICD-10-CM | POA: Diagnosis not present

## 2018-11-21 DIAGNOSIS — M6282 Rhabdomyolysis: Secondary | ICD-10-CM | POA: Diagnosis not present

## 2018-11-21 DIAGNOSIS — F1010 Alcohol abuse, uncomplicated: Secondary | ICD-10-CM | POA: Diagnosis not present

## 2018-11-21 DIAGNOSIS — E441 Mild protein-calorie malnutrition: Secondary | ICD-10-CM | POA: Diagnosis not present

## 2018-11-21 DIAGNOSIS — D649 Anemia, unspecified: Secondary | ICD-10-CM | POA: Diagnosis not present

## 2018-11-21 DIAGNOSIS — E039 Hypothyroidism, unspecified: Secondary | ICD-10-CM | POA: Diagnosis not present

## 2018-11-21 DIAGNOSIS — R531 Weakness: Secondary | ICD-10-CM | POA: Diagnosis not present

## 2018-11-21 DIAGNOSIS — E876 Hypokalemia: Secondary | ICD-10-CM | POA: Diagnosis not present

## 2018-11-21 DIAGNOSIS — I480 Paroxysmal atrial fibrillation: Secondary | ICD-10-CM | POA: Diagnosis not present

## 2018-11-21 DIAGNOSIS — N401 Enlarged prostate with lower urinary tract symptoms: Secondary | ICD-10-CM | POA: Diagnosis not present

## 2018-11-21 LAB — PHOSPHORUS: Phosphorus: 2.6 mg/dL (ref 2.5–4.6)

## 2018-11-21 LAB — BASIC METABOLIC PANEL
Anion gap: 8 (ref 5–15)
BUN: 13 mg/dL (ref 8–23)
CO2: 31 mmol/L (ref 22–32)
Calcium: 7.9 mg/dL — ABNORMAL LOW (ref 8.9–10.3)
Chloride: 100 mmol/L (ref 98–111)
Creatinine, Ser: 0.75 mg/dL (ref 0.61–1.24)
GFR calc Af Amer: >60 mL/min (ref >60)
GFR calc non Af Amer: >60 mL/min (ref >60)
Glucose, Bld: 102 mg/dL — ABNORMAL HIGH (ref 70–99)
Potassium: 3.6 mmol/L (ref 3.5–5.1)
Sodium: 139 mmol/L (ref 135–145)

## 2018-11-21 LAB — MAGNESIUM: Magnesium: 1.7 mg/dL (ref 1.7–2.4)

## 2018-11-21 MED ORDER — MAGNESIUM SULFATE 2 GM/50ML IV SOLN: 2.0000 g | Freq: Once | INTRAVENOUS | Status: DC

## 2018-11-21 MED ORDER — FUROSEMIDE 20 MG PO TABS
20.0000 mg | ORAL_TABLET | Freq: Once | ORAL | Status: AC
  Administered 2018-11-21: 13:00:00 20 mg via ORAL
  Filled 2018-11-21: qty 1

## 2018-11-21 MED ORDER — NICOTINE 14 MG/24HR TD PT24: 14.0000 mg | MEDICATED_PATCH | Freq: Every day | TRANSDERMAL | 0 refills | Status: DC

## 2018-11-21 MED ORDER — TAMSULOSIN HCL 0.4 MG PO CAPS: 0.4000 mg | ORAL_CAPSULE | Freq: Every day | ORAL | 0 refills | Status: DC

## 2018-11-21 MED ORDER — METOPROLOL TARTRATE 25 MG PO TABS: 25.0000 mg | ORAL_TABLET | Freq: Two times a day (BID) | ORAL | 0 refills | Status: DC

## 2018-11-21 MED ORDER — POTASSIUM CHLORIDE 20 MEQ PO PACK
40.0000 meq | PACK | Freq: Once | ORAL | Status: AC
  Administered 2018-11-21: 40 meq via ORAL
  Filled 2018-11-21: qty 2

## 2018-11-21 NOTE — Plan of Care (Signed)
  Problem: Health Behavior/Discharge Planning: Goal: Ability to manage health-related needs will improve Outcome: Adequate for Discharge   Problem: Clinical Measurements: Goal: Ability to maintain clinical measurements within normal limits will improve Outcome: Adequate for Discharge Goal: Will remain free from infection Outcome: Adequate for Discharge Goal: Diagnostic test results will improve Outcome: Adequate for Discharge Goal: Respiratory complications will improve Outcome: Adequate for Discharge Goal: Cardiovascular complication will be avoided Outcome: Adequate for Discharge   Problem: Activity: Goal: Risk for activity intolerance will decrease Outcome: Adequate for Discharge   Problem: Nutrition: Goal: Adequate nutrition will be maintained Outcome: Adequate for Discharge   Problem: Elimination: Goal: Will not experience complications related to bowel motility Outcome: Adequate for Discharge Goal: Will not experience complications related to urinary retention Outcome: Adequate for Discharge   Problem: Safety: Goal: Ability to remain free from injury will improve Outcome: Adequate for Discharge   Problem: Skin Integrity: Goal: Risk for impaired skin integrity will decrease Outcome: Adequate for Discharge

## 2018-11-21 NOTE — TOC Transition Note (Signed)
Transition of Care [facility]) - CM/SW Discharge Note   Patient Details  Name: Daniel Edwards MRN: [id number] Date of Birth: February 03, 1935  [facility]) CM/SW Contact:  Ross Ludwig, LCSW Phone Number: 11/21/2018, 3:52 PM   Clinical Narrative:     CSW received phone call from [employer] that they have received insurance auth for patient to go to SNF.  Patient to be d/c'ed today to [facility].  Patient and family agreeable to plans will transport via ems RN to call report to [phone].  CSW spoke to patient's son Daniel Edwards and updated him that patient will be discharging today.       Final next level of care: Skilled Nursing Facility Barriers to Discharge: Barriers Resolved   Patient Goals and CMS Choice Patient states their goals for this hospitalization and ongoing recovery are:: To go to SNF for short term rehab, and CMS Medicare.gov Compare Post Acute Care list provided to:: Patient Choice offered to / list presented to : Patient, Adult Children  Discharge Placement PASRR number recieved: 11/19/18            Patient chooses bed at: [facility] Patient to be transferred to facility by: [facility] Name of family member notified: Patient's son Daniel Edwards [phone] Patient and family notified of of transfer: 11/21/18  Discharge Plan and Services In-house Referral: NA Discharge Planning Services: NA Post Acute Care Choice: [place]          DME Arranged: N/A DME Agency: NA       HH Arranged: NA HH Agency: NA        Social Determinants of Health (SDOH) Interventions     Readmission Risk Interventions Readmission Risk Prevention Plan 11/18/2018  Transportation Screening Complete  Some recent data might be hidden

## 2018-11-21 NOTE — Consult Note (Addendum)
PHARMACY CONSULT NOTE - FOLLOW UP  Pharmacy Consult for Electrolyte Monitoring and Replacement   Recent Labs: Potassium (mmol/L)  Date Value  11/21/2018 3.6  02/23/2013 3.6   Magnesium (mg/dL)  Date Value  11/21/2018 1.7  01/28/2013 2.0   Calcium (mg/dL)  Date Value  11/21/2018 7.9 (L)   Calcium, Total (mg/dL)  Date Value  02/23/2013 8.4 (L)   Albumin (g/dL)  Date Value  11/20/2018 2.8 (L)  01/26/2013 2.5 (L)   Phosphorus (mg/dL)  Date Value  11/21/2018 2.6  12/14/2012 3.2   Sodium (mmol/L)  Date Value  11/21/2018 139  02/23/2013 130 (L)   84 YOM admitted for fall and rhabdomyolysis.  Spoke with patient, who reported having a small amount of diarrhea on 10/12.  No diarrhea reported since.  He had one fully formed stool on 10/13.  He had lasix x1 dose yesterday.  Assessment: K 3.6 - Therapeutic Magnesium 1.7 - therapeutic Phosphorus 2.6  Goal of Therapy:  WNL  Plan:  Will give Klorcon 40 mEq x 1 dose.  (K only increased 0.2 yesterday on KCl 20 mEq and K phos). Will hold mag today d/t HF status.  Avoid fluid overload. Will follow up mag, phosphorus and other electrolytes with AM labs  Gerald Dexter, PharmD Pharmacy Resident  11/21/2018 9:32 AM

## 2018-11-21 NOTE — Discharge Summary (Signed)
[facility] [place] NAME: Isaiahs Franko    MR#:  [id number]  [place] OF BIRTH:  [birth year]  DATE OF ADMISSION:  11/15/2018 ADMITTING PHYSICIAN: Demetrios Loll, MD  DATE OF DISCHARGE: 11/21/2018   PRIMARY CARE PHYSICIAN: Patient, No Pcp Per    ADMISSION DIAGNOSIS:  Elevated troponin [R77.8] Elevated CK [R74.8] Fall, initial encounter [W19.XXXA]  DISCHARGE DIAGNOSIS:  Active Problems:   Rhabdomyolysis   SECONDARY DIAGNOSIS:   Past Medical History:  Diagnosis Date  . BPH (benign prostatic hyperplasia)   . COPD (chronic obstructive pulmonary disease) ([place])   . Erectile dysfunction   . Essential hypertension   . GERD (gastroesophageal reflux disease)   . Simple chronic bronchitis ([place])     HOSPITAL COURSE:   * Rhabdomyolysisdue tofall. IV fluid support and follow-up CK-gradually coming down. No fractures noted on x-rays. CK level came down to 649.  I am referring him to cardiologist office after discharge for follow-up for A. Fib. On CT had also noted to have ventriculomegaly, I am referring to neurologist for follow-up in clinic.  * Elevated troponin, possible due to rhabdomyolysis. Continue aspirin and follow-up troponin level remained stable. Echocardiograph-no acute abnormalities.  *Diarrhea Patient had 4 watery diarrhea episodes 11/18/18 morning. He was on magnesium oxide twice daily.  I have stopped that and will continue to monitor , no fever, diarrhea resolved, white blood cell count is slightly lower today. Much improved and no more diarrhea.  *Acute diastolic CHF His echocardiogram does not show any drop in EF. Diffuse appearing pulmonary edema on chest x-ray. I will hold his IV fluid but avoid giving Lasix at this time due to rhabdomyolysis. I have requested to give incentive spirometer to help push fluid out. His procalcitonin level is low. I will give oral Lasix one-time dose now.  *I have noted  patient to have paroxysmal A. fib on telemetry recording. He has chads vascular 2 score of 3. He said his primary care doctor had never discussed about anticoagulation.  He does not want to start that for now. I have encouraged him to follow-up with cardiologist in the next 1 to 2 weeks and discuss this as outpatient. He does not have frequent fall at home.  The fall for this admission was only 1 time but last few weeks he was feeling gradually weaker.  * Fall and weakness. Fall precaution PT-suggest SNF. Patient and his son is agreeable for this plan.  * Hypokalemia. Potassium supplement, follow-uppotassiumlevel. Follow magnesium level.  *Hypocalcemia Replace IV and recheck tomorrow. Replace magnesium also.  Managed by pharmacist.  * Leukocytosis. Unclear etiology, urinalysis is negative. I blood cell count came down some with dilution. Patient have some diarrhea and white cell count is up again but has no fever. I will get chest x-ray and monitor tomorrow on blood cell count.  * Hypertension. Blood pressure is normal.Continue Lopressor but hold Norvasc.  * COPD. Stable. Albuterol as needed.  * Tobacco abuse. Smoking cessation was counseled for 3 to 4 minutes. Nicotine patch.   DISCHARGE CONDITIONS:   Stable.  CONSULTS OBTAINED:    DRUG ALLERGIES:  No Known Allergies  DISCHARGE MEDICATIONS:   Allergies as of 11/21/2018   No Known Allergies     Medication List    STOP taking these medications   amLODipine 5 MG tablet Commonly known as: NORVASC   magnesium oxide 400 MG tablet Commonly known as: MAG-OX     TAKE these medications  aspirin 325 MG tablet Take 325 mg by mouth daily.   metoprolol tartrate 25 MG tablet Commonly known as: LOPRESSOR Take 1 tablet (25 mg total) by mouth 2 (two) times daily.   nicotine 14 mg/24hr patch Commonly known as: NICODERM CQ - dosed in mg/24 hours Place 1 patch (14 mg total) onto the skin daily. Start  taking on: November 22, 2018   Omeprazole 20 MG Tbec Take 20 mg by mouth daily.   tamsulosin 0.4 MG Caps capsule Commonly known as: FLOMAX Take 1 capsule (0.4 mg total) by mouth daily.        DISCHARGE INSTRUCTIONS:    Follow with cardiologist and neurologist in office.  If you experience worsening of your admission symptoms, develop shortness of breath, life threatening emergency, suicidal or homicidal thoughts you must seek medical attention immediately by calling 911 or calling your MD immediately  if symptoms less severe.  You Must read complete instructions/literature along with all the possible adverse reactions/side effects for all the Medicines you take and that have been prescribed to you. Take any new Medicines after you have completely understood and accept all the possible adverse reactions/side effects.   Please note  You were cared for by a hospitalist during your hospital stay. If you have any questions about your discharge medications or the care you received while you were in the hospital after you are discharged, you can call the unit and asked to speak with the hospitalist on call if the hospitalist that took care of you is not available. Once you are discharged, your primary care physician will handle any further medical issues. Please note that NO REFILLS for any discharge medications will be authorized once you are discharged, as it is imperative that you return to your primary care physician (or establish a relationship with a primary care physician if you do not have one) for your aftercare needs so that they can reassess your need for medications and monitor your lab values.    Today   CHIEF COMPLAINT:   Chief Complaint  Patient presents with  . Fall    HISTORY OF PRESENT ILLNESS:  Jahmai Allport  is a 84 y.o. male with a known history of hypertension, COPD, BPH, GERD, and chronic bronchitis.  The patient needs to be weaned along himself.  He felt dizzy  and fell at home last night.  Back and head his head.  He denies any syncope or loss consciousness or focal weakness.  He complains of bilateral leg pain.  His stay on the floor for about 12 hours until policeman came to his home.  His CK is elevated at 3695, troponin is elevated at 263.  Potassium is 2.9.  WBC 22.2.  Of the head and cervical spine did not show any fracture or dislocation or hemorrhage.   VITAL SIGNS:  Blood pressure (!) 114/58, pulse 80, temperature 98 F (36.7 C), temperature source Oral, resp. rate 19, height 5\' 10"  (1.778 m), weight 61.6 kg, SpO2 93 %.  I/O:    Intake/Output Summary (Last 24 hours) at 11/21/2018 1531 Last data filed at 11/21/2018 1314 Gross per 24 hour  Intake 480 ml  Output 2750 ml  Net -2270 ml    PHYSICAL EXAMINATION:  GENERAL:  84 y.o.-year-old patient lying in the bed with no acute distress.  EYES: Pupils equal, round, reactive to light and accommodation. No scleral icterus. Extraocular muscles intact.  HEENT: Head atraumatic, normocephalic. Oropharynx and nasopharynx clear.  NECK:  Supple, no jugular  venous distention. No thyroid enlargement, no tenderness.  LUNGS: Normal breath sounds bilaterally, no wheezing, rales,rhonchi or crepitation. No use of accessory muscles of respiration.  CARDIOVASCULAR: S1, S2 normal. No murmurs, rubs, or gallops.  ABDOMEN: Soft, non-tender, non-distended. Bowel sounds present. No organomegaly or mass.  EXTREMITIES: No pedal edema, cyanosis, or clubbing.  NEUROLOGIC: Cranial nerves II through XII are intact. Muscle strength 4/5 in all extremities. Sensation intact. Gait not checked.  PSYCHIATRIC: The patient is alert and oriented x 3.  SKIN: No obvious rash, lesion, or ulcer.   DATA REVIEW:   CBC Recent Labs  Lab 11/20/18 0416  WBC 13.8*  HGB 10.9*  HCT 32.7*  PLT 185    Chemistries  Recent Labs  Lab 11/20/18 0416 11/21/18 0556  NA 135 139  K 3.4* 3.6  CL 99 100  CO2 26 31  GLUCOSE 102* 102*   BUN 14 13  CREATININE 0.66 0.75  CALCIUM 7.7* 7.9*  MG 1.7 1.7  AST 44*  --   ALT 31  --   ALKPHOS 57  --   BILITOT 0.5  --     Cardiac Enzymes No results for input(s): TROPONINI in the last 168 hours.  Microbiology Results  Results for orders placed or performed during the hospital encounter of 11/15/18  SARS Coronavirus 2 by RT PCR (hospital order, performed in [facility] hospital lab) Nasopharyngeal Nasopharyngeal Swab     Status: None   Collection Time: 11/15/18 12:59 PM   Specimen: Nasopharyngeal Swab  Result Value Ref Range Status   SARS Coronavirus 2 NEGATIVE NEGATIVE Final    Comment: (NOTE) If result is NEGATIVE SARS-CoV-2 target nucleic acids are NOT DETECTED. The SARS-CoV-2 RNA is generally detectable in upper and lower  respiratory specimens during the acute phase of infection. The lowest  concentration of SARS-CoV-2 viral copies this assay can detect is 250  copies / mL. A negative result does not preclude SARS-CoV-2 infection  and should not be used as the sole basis for treatment or other  patient management decisions.  A negative result may occur with  improper specimen collection / handling, submission of specimen other  than nasopharyngeal swab, presence of viral mutation(s) within the  areas targeted by this assay, and inadequate number of viral copies  (<250 copies / mL). A negative result must be combined with clinical  observations, patient history, and epidemiological information. If result is POSITIVE SARS-CoV-2 target nucleic acids are DETECTED. The SARS-CoV-2 RNA is generally detectable in upper and lower  respiratory specimens dur ing the acute phase of infection.  Positive  results are indicative of active infection with SARS-CoV-2.  Clinical  correlation with patient history and other diagnostic information is  necessary to determine patient infection status.  Positive results do  not rule out bacterial infection or co-infection with other  viruses. If result is PRESUMPTIVE POSTIVE SARS-CoV-2 nucleic acids MAY BE PRESENT.   A presumptive positive result was obtained on the submitted specimen  and confirmed on repeat testing.  While 2019 novel coronavirus  (SARS-CoV-2) nucleic acids may be present in the submitted sample  additional confirmatory testing may be necessary for epidemiological  and / or clinical management purposes  to differentiate between  SARS-CoV-2 and other Sarbecovirus currently known to infect humans.  If clinically indicated additional testing with an alternate test  methodology [phone]) is advised. The SARS-CoV-2 RNA is generally  detectable in upper and lower respiratory sp ecimens during the acute  phase of infection. The expected  result is Negative. Fact Sheet for Patients:  StrictlyIdeas.no Fact Sheet for Healthcare Providers: BankingDealers.co.za This test is not yet approved or cleared by the [place] FDA and has been authorized for detection and/or diagnosis of SARS-CoV-2 by FDA under an Emergency Use Authorization (EUA).  This EUA will remain in effect (meaning this test can be used) for the duration of the COVID-19 declaration under Section 564(b)(1) of the Act, 21 U.S.C. section 360bbb-3(b)(1), unless the authorization is terminated or revoked sooner. Performed at [facility], [address] [place], [place] [address]   Urine culture     Status: None   Collection Time: 11/17/18  7:00 PM   Specimen: Urine, Random  Result Value Ref Range Status   Specimen Description   Final    URINE, RANDOM Performed at [facility], [address], [place], [place] [address]    Special Requests   Final    NONE Performed at [facility], [address] [place], [place] [address]    Culture   Final    Multiple bacterial morphotypes present, none predominant. Suggest appropriate recollection if clinically indicated.    Report Status 11/19/2018 FINAL  Final  SARS CORONAVIRUS 2 (TAT 6-24 HRS) Nasopharyngeal Nasopharyngeal Swab     Status: None   Collection Time: 11/19/18  2:13 PM   Specimen: Nasopharyngeal Swab  Result Value Ref Range Status   SARS Coronavirus 2 NEGATIVE NEGATIVE Final    Comment: (NOTE) SARS-CoV-2 target nucleic acids are NOT DETECTED. The SARS-CoV-2 RNA is generally detectable in upper and lower respiratory specimens during the acute phase of infection. Negative results do not preclude SARS-CoV-2 infection, do not rule out co-infections with other pathogens, and should not be used as the sole basis for treatment or other patient management decisions. Negative results must be combined with clinical observations, patient history, and epidemiological information. The expected result is Negative. Fact Sheet for Patients: SugarRoll.be Fact Sheet for Healthcare Providers: https://www.woods-mathews.com/ This test is not yet approved or cleared by the [place] FDA and  has been authorized for detection and/or diagnosis of SARS-CoV-2 by FDA under an Emergency Use Authorization (EUA). This EUA will remain  in effect (meaning this test can be used) for the duration of the COVID-19 declaration under Section 56 4(b)(1) of the Act, 21 U.S.C. section 360bbb-3(b)(1), unless the authorization is terminated or revoked sooner. Performed at [facility], [place] [address], [place], [place] [address]     RADIOLOGY:  No results found.  EKG:   Orders placed or performed during the hospital encounter of 11/15/18  . EKG 12-Lead  . EKG 12-Lead  . ED EKG  . ED EKG  . EKG 12-Lead  . EKG 12-Lead  . EKG 12-Lead  . EKG 12-Lead      Management plans discussed with the patient, family and they are in agreement.  CODE STATUS:     Code Status Orders  (From admission, onward)         Start     Ordered   11/15/18 1630  Full code   Continuous     11/15/18 1629        Code Status History    Date Active Date Inactive Code Status Order ID Comments User Context   07/20/2015 1512 07/21/2015 1601 Full Code [id number]  Hillary Bow, MD ED   02/19/2015 0724 02/20/2015 1757 Full Code [id number]  Harrie Foreman, MD Inpatient   Advance Care Planning Activity  TOTAL TIME TAKING CARE OF THIS PATIENT: 35 minutes.    Vaughan Basta M.D on 11/21/2018 at 3:31 PM  Between 7am to 6pm - Pager - [phone]  After 6pm go to [email or web address] - password [email or web address] [facility]  Office  [phone]  CC: Primary care physician; Patient, No Pcp Per   Note: This dictation was prepared with Dragon dictation along with smaller phrase technology. Any transcriptional errors that result from this process are unintentional.

## 2018-11-21 NOTE — Progress Notes (Signed)
Discharge instructions given to Daniel Edwards at [facility]. EMS called for transport. Family updated.

## 2018-11-21 NOTE — Progress Notes (Signed)
Nutrition Follow Up Note   DOCUMENTATION CODES:   Underweight  INTERVENTION:   MVI daily   Regular diet   NUTRITION DIAGNOSIS:   Increased nutrient needs related to catabolic illness(COPD) as evidenced by increased estimated needs.  GOAL:   Patient will meet greater than or equal to 90% of their needs  -progressing   MONITOR:   PO intake, Supplement acceptance, Labs, Weight trends, Skin, I & O's  ASSESSMENT:   84 y.o. male with a known history of hypertension, COPD, BPH, GERD, and chronic bronchitis who is admitted for rhabdomyelitis and elevated troponin s/p fall.  Pt with improved appetite and oral intake; pt eating 100% of meals. Ensure discontinued by MD; possible in setting of diarrhea? Refeed labs stabilizing. Per chart, pt with ~13lb weight gain since admit.   Medications reviewed and include: aspirin, lovenox, MVI, nicotine, protonix  Labs reviewed: K 3.6 wnl, P 2.6 wnl, Mg 1.7 wnl Wbc- 13.8(H), Hgb 10.9(L), Hct 32.7(L)  Diet Order:   Diet Order            Diet regular Room service appropriate? Yes; Fluid consistency: Thin  Diet effective now             EDUCATION NEEDS:   No education needs have been identified at this time  Skin:  Skin Assessment: Reviewed RN Assessment(abrasions arms and legs s/p fall)  Last BM:  10/14- type 5  Height:   Ht Readings from Last 1 Encounters:  11/15/18 5\' 10"  (1.778 m)    Weight:   Wt Readings from Last 1 Encounters:  11/21/18 61.6 kg    Ideal Body Weight:  75.4 kg  BMI:  Body mass index is 19.5 kg/m.  Estimated Nutritional Needs:   Kcal:  1700-1900kcal/day  Protein:  85-95g/day  Fluid:  >1.4L/day  Koleen Distance MS, RD, LDN Pager #- [phone] Office#- [phone] After Hours Pager: [phone]

## 2018-11-22 DIAGNOSIS — E039 Hypothyroidism, unspecified: Secondary | ICD-10-CM | POA: Diagnosis not present

## 2018-11-22 DIAGNOSIS — F1010 Alcohol abuse, uncomplicated: Secondary | ICD-10-CM | POA: Diagnosis not present

## 2018-11-22 DIAGNOSIS — E441 Mild protein-calorie malnutrition: Secondary | ICD-10-CM | POA: Diagnosis not present

## 2018-11-22 DIAGNOSIS — R509 Fever, unspecified: Secondary | ICD-10-CM | POA: Diagnosis not present

## 2018-11-22 DIAGNOSIS — E0590 Thyrotoxicosis, unspecified without thyrotoxic crisis or storm: Secondary | ICD-10-CM | POA: Diagnosis not present

## 2018-11-22 DIAGNOSIS — I4821 Permanent atrial fibrillation: Secondary | ICD-10-CM | POA: Diagnosis not present

## 2018-11-22 DIAGNOSIS — D649 Anemia, unspecified: Secondary | ICD-10-CM | POA: Diagnosis not present

## 2018-11-22 DIAGNOSIS — T796XXD Traumatic ischemia of muscle, subsequent encounter: Secondary | ICD-10-CM | POA: Diagnosis not present

## 2018-11-22 DIAGNOSIS — J410 Simple chronic bronchitis: Secondary | ICD-10-CM | POA: Diagnosis not present

## 2018-11-22 DIAGNOSIS — I10 Essential (primary) hypertension: Secondary | ICD-10-CM | POA: Diagnosis not present

## 2019-01-21 ENCOUNTER — Other Ambulatory Visit: Payer: Self-pay

## 2019-01-21 ENCOUNTER — Emergency Department: Payer: Medicare HMO

## 2019-01-21 ENCOUNTER — Encounter: Payer: Self-pay | Admitting: Emergency Medicine

## 2019-01-21 DIAGNOSIS — I491 Atrial premature depolarization: Secondary | ICD-10-CM | POA: Diagnosis not present

## 2019-01-21 DIAGNOSIS — I499 Cardiac arrhythmia, unspecified: Secondary | ICD-10-CM | POA: Diagnosis not present

## 2019-01-21 DIAGNOSIS — N179 Acute kidney failure, unspecified: Secondary | ICD-10-CM | POA: Diagnosis not present

## 2019-01-21 DIAGNOSIS — I959 Hypotension, unspecified: Secondary | ICD-10-CM | POA: Diagnosis not present

## 2019-01-21 DIAGNOSIS — M2550 Pain in unspecified joint: Secondary | ICD-10-CM | POA: Diagnosis not present

## 2019-01-21 DIAGNOSIS — E569 Vitamin deficiency, unspecified: Secondary | ICD-10-CM | POA: Diagnosis not present

## 2019-01-21 DIAGNOSIS — Z79899 Other long term (current) drug therapy: Secondary | ICD-10-CM | POA: Diagnosis not present

## 2019-01-21 DIAGNOSIS — I4891 Unspecified atrial fibrillation: Secondary | ICD-10-CM | POA: Insufficient documentation

## 2019-01-21 DIAGNOSIS — R531 Weakness: Secondary | ICD-10-CM | POA: Diagnosis not present

## 2019-01-21 DIAGNOSIS — F1020 Alcohol dependence, uncomplicated: Secondary | ICD-10-CM | POA: Diagnosis present

## 2019-01-21 DIAGNOSIS — K219 Gastro-esophageal reflux disease without esophagitis: Secondary | ICD-10-CM | POA: Diagnosis not present

## 2019-01-21 DIAGNOSIS — R0902 Hypoxemia: Secondary | ICD-10-CM | POA: Diagnosis not present

## 2019-01-21 DIAGNOSIS — J3489 Other specified disorders of nose and nasal sinuses: Secondary | ICD-10-CM | POA: Insufficient documentation

## 2019-01-21 DIAGNOSIS — R000 Tachycardia, unspecified: Secondary | ICD-10-CM | POA: Diagnosis not present

## 2019-01-21 DIAGNOSIS — I10 Essential (primary) hypertension: Secondary | ICD-10-CM | POA: Insufficient documentation

## 2019-01-21 DIAGNOSIS — R627 Adult failure to thrive: Secondary | ICD-10-CM | POA: Diagnosis not present

## 2019-01-21 DIAGNOSIS — I493 Ventricular premature depolarization: Secondary | ICD-10-CM | POA: Diagnosis present

## 2019-01-21 DIAGNOSIS — E860 Dehydration: Secondary | ICD-10-CM | POA: Diagnosis not present

## 2019-01-21 DIAGNOSIS — R0689 Other abnormalities of breathing: Secondary | ICD-10-CM | POA: Diagnosis not present

## 2019-01-21 DIAGNOSIS — J449 Chronic obstructive pulmonary disease, unspecified: Secondary | ICD-10-CM | POA: Insufficient documentation

## 2019-01-21 DIAGNOSIS — E43 Unspecified severe protein-calorie malnutrition: Secondary | ICD-10-CM | POA: Diagnosis not present

## 2019-01-21 DIAGNOSIS — Z809 Family history of malignant neoplasm, unspecified: Secondary | ICD-10-CM | POA: Diagnosis not present

## 2019-01-21 DIAGNOSIS — I480 Paroxysmal atrial fibrillation: Secondary | ICD-10-CM | POA: Diagnosis present

## 2019-01-21 DIAGNOSIS — Z681 Body mass index (BMI) 19 or less, adult: Secondary | ICD-10-CM | POA: Diagnosis not present

## 2019-01-21 DIAGNOSIS — R001 Bradycardia, unspecified: Secondary | ICD-10-CM | POA: Diagnosis not present

## 2019-01-21 DIAGNOSIS — E876 Hypokalemia: Secondary | ICD-10-CM | POA: Diagnosis not present

## 2019-01-21 DIAGNOSIS — M6281 Muscle weakness (generalized): Secondary | ICD-10-CM | POA: Diagnosis not present

## 2019-01-21 DIAGNOSIS — R9431 Abnormal electrocardiogram [ECG] [EKG]: Secondary | ICD-10-CM | POA: Diagnosis present

## 2019-01-21 DIAGNOSIS — Z7401 Bed confinement status: Secondary | ICD-10-CM | POA: Diagnosis not present

## 2019-01-21 DIAGNOSIS — Z20828 Contact with and (suspected) exposure to other viral communicable diseases: Secondary | ICD-10-CM | POA: Diagnosis not present

## 2019-01-21 DIAGNOSIS — Z7982 Long term (current) use of aspirin: Secondary | ICD-10-CM | POA: Diagnosis not present

## 2019-01-21 DIAGNOSIS — M6282 Rhabdomyolysis: Secondary | ICD-10-CM | POA: Diagnosis not present

## 2019-01-21 DIAGNOSIS — F17210 Nicotine dependence, cigarettes, uncomplicated: Secondary | ICD-10-CM | POA: Insufficient documentation

## 2019-01-21 DIAGNOSIS — N400 Enlarged prostate without lower urinary tract symptoms: Secondary | ICD-10-CM | POA: Diagnosis present

## 2019-01-21 DIAGNOSIS — I4510 Unspecified right bundle-branch block: Secondary | ICD-10-CM | POA: Diagnosis not present

## 2019-01-21 LAB — BASIC METABOLIC PANEL
Anion gap: 15 (ref 5–15)
BUN: 8 mg/dL (ref 8–23)
CO2: 25 mmol/L (ref 22–32)
Calcium: 7.4 mg/dL — ABNORMAL LOW (ref 8.9–10.3)
Chloride: 90 mmol/L — ABNORMAL LOW (ref 98–111)
Creatinine, Ser: 0.76 mg/dL (ref 0.61–1.24)
GFR calc Af Amer: >60 mL/min (ref >60)
GFR calc non Af Amer: >60 mL/min (ref >60)
Glucose, Bld: 210 mg/dL — ABNORMAL HIGH (ref 70–99)
Potassium: 3.3 mmol/L — ABNORMAL LOW (ref 3.5–5.1)
Sodium: 130 mmol/L — ABNORMAL LOW (ref 135–145)

## 2019-01-21 LAB — CBC
HCT: 38.5 % — ABNORMAL LOW (ref 39.0–52.0)
Hemoglobin: 13.6 g/dL (ref 13.0–17.0)
MCH: 29.2 pg (ref 26.0–34.0)
MCHC: 35.3 g/dL (ref 30.0–36.0)
MCV: 82.6 fL (ref 80.0–100.0)
Platelets: 259 10*3/uL (ref 150–400)
RBC: 4.66 MIL/uL (ref 4.22–5.81)
RDW: 12.8 % (ref 11.5–15.5)
WBC: 10.1 10*3/uL (ref 4.0–10.5)
nRBC: 0 % (ref 0.0–0.2)

## 2019-01-21 MED ORDER — SODIUM CHLORIDE 0.9% FLUSH: 3.0000 mL | Freq: Once | INTRAVENOUS | Status: DC

## 2019-01-21 NOTE — ED Triage Notes (Signed)
Pt via ems from home with sinus pain x 3 days that is getting worse. EMS mentioned that pt was Afib on monitor; he does not show afib during triage. Pt alert & oriented with NAD noted.

## 2019-01-22 ENCOUNTER — Emergency Department
Admission: EM | Admit: 2019-01-22 | Discharge: 2019-01-22 | Disposition: A | Discharge status: Home / Self Care | Payer: Medicare HMO | Source: Home / Self Care | Attending: Emergency Medicine | Admitting: Emergency Medicine

## 2019-01-22 DIAGNOSIS — J3489 Other specified disorders of nose and nasal sinuses: Secondary | ICD-10-CM

## 2019-01-22 LAB — TROPONIN I (HIGH SENSITIVITY): Troponin I (High Sensitivity): 21 ng/L — ABNORMAL HIGH (ref <18)

## 2019-01-22 MED ORDER — OXYMETAZOLINE HCL 0.05 % NA SOLN: 3.0000 | Freq: Two times a day (BID) | NASAL | 0 refills | Status: DC

## 2019-01-22 MED ORDER — OXYMETAZOLINE HCL 0.05 % NA SOLN
1.0000 | Freq: Once | NASAL | Status: AC
  Administered 2019-01-22: 03:00:00 1 via NASAL
  Filled 2019-01-22: qty 30

## 2019-01-22 NOTE — ED Notes (Signed)
This RN introduced self to pt. Pt states he has been having sinus pain for the past few days. Pt states he lives at home alone and his son comes and checks on him. His son called EMS. Pt denies CP, SOB, N/V/D, or fever. Pt in NAD.

## 2019-01-22 NOTE — ED Notes (Signed)
This RN spoke with pt's son for transportation upon discharge

## 2019-01-22 NOTE — ED Provider Notes (Signed)
[facility] Emergency Department Provider Note  ____________________________________________  Time seen: Approximately 3:04 AM  I have reviewed the triage vital signs and the nursing notes.   HISTORY  Chief Complaint Facial Pain   HPI Daniel Edwards is a 84 y.o. male with a history of BPH, COPD, hypertension, GERD, A. fib who presents for evaluation of facial pain.  Patient reports 3 days of constant pressure on his maxillary sinus bilaterally and nasal congestion.  No fever, no chills, no cough, no headache, no vomiting, no sore throat, no loss of taste or smell, no chest pain or shortness of breath.   EMS initially reported the patient was in A. fib however upon arrival to the emergency room patient's EKG shows no A. fib  Past Medical History:  Diagnosis Date  . BPH (benign prostatic hyperplasia)   . COPD (chronic obstructive pulmonary disease) ([place])   . Erectile dysfunction   . Essential hypertension   . GERD (gastroesophageal reflux disease)   . Simple chronic bronchitis [facility])     Patient Active Problem List   Diagnosis Date Noted  . Rhabdomyolysis 11/15/2018  . A-fib ([place]) 07/20/2015  . Chest pain 02/19/2015  . Atypical chest pain 02/19/2015    Past Surgical History:  Procedure Laterality Date  . INGUINAL HERNIA REPAIR      Prior to Admission medications   Medication Sig Start Date End Date Taking? Authorizing Provider  aspirin 325 MG tablet Take 325 mg by mouth daily.    [provider]  metoprolol tartrate (LOPRESSOR) 25 MG tablet Take 1 tablet (25 mg total) by mouth 2 (two) times daily. 11/21/18   Vaughan Basta, MD  nicotine (NICODERM CQ - DOSED IN MG/24 HOURS) 14 mg/24hr patch Place 1 patch (14 mg total) onto the skin daily. 11/22/18   Vaughan Basta, MD  Omeprazole 20 MG TBEC Take 20 mg by mouth daily.     [provider]  oxymetazoline (AFRIN) 0.05 % nasal spray Place 3 sprays into both nostrils 2  (two) times daily for 5 days. 01/22/19 01/27/19  Rudene Re, MD  tamsulosin (FLOMAX) 0.4 MG CAPS capsule Take 1 capsule (0.4 mg total) by mouth daily. 11/21/18   Vaughan Basta, MD    Allergies Patient has no known allergies.  Family History  Problem Relation Age of Onset  . Cancer Mother     Social History Social History   Tobacco Use  . Smoking status: Current Every Day Smoker    Packs/day: 1.00    Types: Cigarettes  . Smokeless tobacco: Never Used  Substance Use Topics  . Alcohol use: Yes    Alcohol/week: 0.0 standard drinks  . Drug use: Not on file    Review of Systems  Constitutional: Negative for fever. Eyes: Negative for visual changes. ENT: Negative for sore throat. + maxillary sinus pressure and congestion Neck: No neck pain  Cardiovascular: Negative for chest pain. Respiratory: Negative for shortness of breath. Gastrointestinal: Negative for abdominal pain, vomiting or diarrhea. Genitourinary: Negative for dysuria. Musculoskeletal: Negative for back pain. Skin: Negative for rash. Neurological: Negative for headaches, weakness or numbness. Psych: No SI or HI  ____________________________________________   PHYSICAL EXAM:  VITAL SIGNS: ED Triage Vitals  Enc Vitals Group     BP 01/21/19 2118 (!) 194/95     Pulse Rate 01/21/19 2118 77     Resp 01/21/19 2118 18     Temp 01/21/19 2118 98.5 F (36.9 C)     Temp Source 01/21/19  2118 Oral     SpO2 01/21/19 2118 98 %     Weight 01/21/19 2119 150 lb (68 kg)     Height 01/21/19 2119 5' 10.5" (1.791 m)     Head Circumference --      Peak Flow --      Pain Score 01/21/19 2124 9     Pain Loc --      Pain Edu? --      Excl. in [place]? --     Constitutional: Alert and oriented. Well appearing and in no apparent distress. HEENT:      Head: Normocephalic and atraumatic.  No tenderness to palpation over the sinuses, no erythema or warmth       Eyes: Conjunctivae are normal. Sclera is non-icteric.  No periorbital erythema or swelling. PERRL, EOMI      Mouth/Throat: Mucous membranes are dry, oropharynx is clear with no peritonsillar abscess, no exudate, no trismus      Neck: Supple with no signs of meningismus. Cardiovascular: Regular rate and rhythm.  Respiratory: Normal respiratory effort.  Gastrointestinal: Soft, non tender, and non distended with positive bowel sounds. No rebound or guarding. Musculoskeletal: Nontender with normal range of motion in all extremities. No edema, cyanosis, or erythema of extremities. Neurologic: Normal speech and language. Face is symmetric. Moving all extremities. No gross focal neurologic deficits are appreciated. Skin: Skin is warm, dry and intact. No rash noted. Psychiatric: Mood and affect are normal. Speech and behavior are normal.  ____________________________________________   LABS (all labs ordered are listed, but only abnormal results are displayed)  Labs Reviewed  BASIC METABOLIC PANEL - Abnormal; Notable for the following components:      Result Value   Sodium 130 (*)    Potassium 3.3 (*)    Chloride 90 (*)    Glucose, Bld 210 (*)    Calcium 7.4 (*)    All other components within normal limits  CBC - Abnormal; Notable for the following components:   HCT 38.5 (*)    All other components within normal limits  TROPONIN I (HIGH SENSITIVITY) - Abnormal; Notable for the following components:   Troponin I (High Sensitivity) 21 (*)    All other components within normal limits  TROPONIN I (HIGH SENSITIVITY)   ____________________________________________  EKG  ED ECG REPORT I, Rudene Re, the attending physician, personally viewed and interpreted this ECG.  Normal sinus rhythm, rate of 84, right bundle branch block, prolonged QTC, no concordant ST elevations.  Unchanged from prior. ____________________________________________  RADIOLOGY  none  ____________________________________________   PROCEDURES  Procedure(s)  performed: None Procedures Critical Care performed:  None ____________________________________________   INITIAL IMPRESSION / ASSESSMENT AND PLAN / ED COURSE  84 y.o. male with a history of BPH, COPD, hypertension, GERD, A. fib who presents for evaluation of 3 days of bilateral maxillary sinus pressure and congestion consistent with uncomplicated sinusitis.  Patient has otherwise normal exam as listed above.  We will start him on Afrin and discharge home and follow-up with PCP.  Discussed my standard return precautions.       As part of my medical decision making, I reviewed the following data within the [place] notes reviewed and incorporated, Labs reviewed , EKG interpreted , Old chart reviewed, Notes from prior ED visits and [place] Controlled Substance Database   Please note:  Patient was evaluated in Emergency Department today for the symptoms described in the history of present illness. Patient was evaluated in the context  of the global COVID-19 pandemic, which necessitated consideration that the patient might be at risk for infection with the SARS-CoV-2 virus that causes COVID-19. Institutional protocols and algorithms that pertain to the evaluation of patients at risk for COVID-19 are in a state of rapid change based on information released by regulatory bodies including the CDC and federal and state organizations. These policies and algorithms were followed during the patient's care in the ED.  Some ED evaluations and interventions may be delayed as a result of limited staffing during the pandemic.   ____________________________________________   FINAL CLINICAL IMPRESSION(S) / ED DIAGNOSES   Final diagnoses:  Sinus pain      NEW MEDICATIONS STARTED DURING THIS VISIT:  ED Discharge Orders         Ordered    oxymetazoline (AFRIN) 0.05 % nasal spray  2 times daily     01/22/19 0426           Note:  This document was prepared using Dragon voice  recognition software and may include unintentional dictation errors.    Rudene Re, MD 01/22/19 [phone]

## 2019-01-24 ENCOUNTER — Other Ambulatory Visit: Payer: Self-pay

## 2019-01-24 ENCOUNTER — Inpatient Hospital Stay
Admission: EM | Admit: 2019-01-24 | Discharge: 2019-01-31 | DRG: 640 | Disposition: A | Discharge status: Skilled Nursing Facility | Payer: Medicare HMO | Attending: Internal Medicine | Admitting: Internal Medicine

## 2019-01-24 ENCOUNTER — Emergency Department: Payer: Medicare HMO

## 2019-01-24 DIAGNOSIS — R531 Weakness: Secondary | ICD-10-CM | POA: Diagnosis present

## 2019-01-24 DIAGNOSIS — Z20828 Contact with and (suspected) exposure to other viral communicable diseases: Secondary | ICD-10-CM | POA: Diagnosis present

## 2019-01-24 DIAGNOSIS — E860 Dehydration: Principal | ICD-10-CM | POA: Diagnosis present

## 2019-01-24 DIAGNOSIS — I491 Atrial premature depolarization: Secondary | ICD-10-CM | POA: Diagnosis present

## 2019-01-24 DIAGNOSIS — F1020 Alcohol dependence, uncomplicated: Secondary | ICD-10-CM | POA: Diagnosis present

## 2019-01-24 DIAGNOSIS — E43 Unspecified severe protein-calorie malnutrition: Secondary | ICD-10-CM | POA: Insufficient documentation

## 2019-01-24 DIAGNOSIS — Z7982 Long term (current) use of aspirin: Secondary | ICD-10-CM

## 2019-01-24 DIAGNOSIS — R627 Adult failure to thrive: Secondary | ICD-10-CM | POA: Diagnosis present

## 2019-01-24 DIAGNOSIS — I10 Essential (primary) hypertension: Secondary | ICD-10-CM | POA: Diagnosis present

## 2019-01-24 DIAGNOSIS — I493 Ventricular premature depolarization: Secondary | ICD-10-CM | POA: Diagnosis present

## 2019-01-24 DIAGNOSIS — I480 Paroxysmal atrial fibrillation: Secondary | ICD-10-CM | POA: Diagnosis present

## 2019-01-24 DIAGNOSIS — M6282 Rhabdomyolysis: Secondary | ICD-10-CM | POA: Diagnosis present

## 2019-01-24 DIAGNOSIS — N400 Enlarged prostate without lower urinary tract symptoms: Secondary | ICD-10-CM | POA: Diagnosis present

## 2019-01-24 DIAGNOSIS — Z809 Family history of malignant neoplasm, unspecified: Secondary | ICD-10-CM | POA: Diagnosis not present

## 2019-01-24 DIAGNOSIS — Z79899 Other long term (current) drug therapy: Secondary | ICD-10-CM | POA: Diagnosis not present

## 2019-01-24 DIAGNOSIS — N179 Acute kidney failure, unspecified: Secondary | ICD-10-CM | POA: Diagnosis present

## 2019-01-24 DIAGNOSIS — F17210 Nicotine dependence, cigarettes, uncomplicated: Secondary | ICD-10-CM | POA: Diagnosis present

## 2019-01-24 DIAGNOSIS — J449 Chronic obstructive pulmonary disease, unspecified: Secondary | ICD-10-CM | POA: Diagnosis present

## 2019-01-24 DIAGNOSIS — I4510 Unspecified right bundle-branch block: Secondary | ICD-10-CM | POA: Diagnosis present

## 2019-01-24 DIAGNOSIS — R9431 Abnormal electrocardiogram [ECG] [EKG]: Secondary | ICD-10-CM | POA: Diagnosis present

## 2019-01-24 DIAGNOSIS — Z681 Body mass index (BMI) 19 or less, adult: Secondary | ICD-10-CM | POA: Diagnosis not present

## 2019-01-24 DIAGNOSIS — K219 Gastro-esophageal reflux disease without esophagitis: Secondary | ICD-10-CM | POA: Diagnosis present

## 2019-01-24 DIAGNOSIS — I482 Chronic atrial fibrillation, unspecified: Secondary | ICD-10-CM | POA: Diagnosis present

## 2019-01-24 DIAGNOSIS — E876 Hypokalemia: Secondary | ICD-10-CM | POA: Diagnosis present

## 2019-01-24 LAB — CBC
HCT: 38.6 % — ABNORMAL LOW (ref 39.0–52.0)
Hemoglobin: 13.3 g/dL (ref 13.0–17.0)
MCH: 29.3 pg (ref 26.0–34.0)
MCHC: 34.5 g/dL (ref 30.0–36.0)
MCV: 85 fL (ref 80.0–100.0)
Platelets: 279 10*3/uL (ref 150–400)
RBC: 4.54 MIL/uL (ref 4.22–5.81)
RDW: 13.1 % (ref 11.5–15.5)
WBC: 14.4 10*3/uL — ABNORMAL HIGH (ref 4.0–10.5)
nRBC: 0 % (ref 0.0–0.2)

## 2019-01-24 LAB — BASIC METABOLIC PANEL
Anion gap: 15 (ref 5–15)
BUN: 35 mg/dL — ABNORMAL HIGH (ref 8–23)
CO2: 27 mmol/L (ref 22–32)
Calcium: 7 mg/dL — ABNORMAL LOW (ref 8.9–10.3)
Chloride: 91 mmol/L — ABNORMAL LOW (ref 98–111)
Creatinine, Ser: 1.8 mg/dL — ABNORMAL HIGH (ref 0.61–1.24)
GFR calc Af Amer: 40 mL/min — ABNORMAL LOW (ref >60)
GFR calc non Af Amer: 34 mL/min — ABNORMAL LOW (ref >60)
Glucose, Bld: 128 mg/dL — ABNORMAL HIGH (ref 70–99)
Potassium: 2.7 mmol/L — CL (ref 3.5–5.1)
Sodium: 133 mmol/L — ABNORMAL LOW (ref 135–145)

## 2019-01-24 LAB — TROPONIN I (HIGH SENSITIVITY): Troponin I (High Sensitivity): 68 ng/L — ABNORMAL HIGH (ref <18)

## 2019-01-24 LAB — CK: Total CK: 725 U/L — ABNORMAL HIGH (ref 49–397)

## 2019-01-24 MED ORDER — SODIUM CHLORIDE 0.9 % IV BOLUS
1000.0000 mL | Freq: Once | INTRAVENOUS | Status: AC
  Administered 2019-01-24: 1000 mL via INTRAVENOUS

## 2019-01-24 MED ORDER — POTASSIUM CHLORIDE CRYS ER 20 MEQ PO TBCR
40.0000 meq | EXTENDED_RELEASE_TABLET | Freq: Once | ORAL | Status: AC
  Administered 2019-01-24: 40 meq via ORAL
  Filled 2019-01-24: qty 2

## 2019-01-24 MED ORDER — POTASSIUM CHLORIDE IN NACL 20-0.9 MEQ/L-% IV SOLN
Freq: Once | INTRAVENOUS | Status: AC
  Filled 2019-01-24: qty 1000

## 2019-01-24 NOTE — H&P (Signed)
History and Physical   Daniel Edwards [id number] DOB: March 28, 1934 DOA: 01/24/2019  Referring MD/NP/PA: Dr. Joan Mayans  PCP: Baxter Hire, MD   Outpatient Specialists: None  Patient coming from: Home  Chief Complaint: Weakness  HPI: Daniel Edwards is a 84 y.o. male with medical history significant of alcoholism, hypertension, atrial fibrillation GERD who was brought in by family due to failing to thrive at home.  He has progressively gotten weaker.  Patient has not been eating or drinking just taking alcohol daily.  Patient has generalized debility to the point that he is not able to move around on his own.  Family believes he is unable to take care of himself at home.  He lives alone.  In the ER patient was found to be weak dehydrated acute kidney injury and hypokalemic.  He is being admitted to the hospital for treatment of generalized weakness as well as failure to thrive..  ED Course: Temperature 97.5 blood pressure 145/80 pulse rate 41 oxygen sat 90% room air.  White count is 14.4 otherwise the rest of the CBC is within normal.  Sodium 133 potassium 3.7 chloride 91 CO2 27 glucose 128 BUN 35 creatinine 1.80 calcium 7.4.  CPK 725 and troponin 68 patient is being admitted to the hospital for further evaluation.    Review of Systems: As per HPI otherwise 10 point review of systems negative.    Past Medical History:  Diagnosis Date  . BPH (benign prostatic hyperplasia)   . COPD (chronic obstructive pulmonary disease) ([place])   . Erectile dysfunction   . Essential hypertension   . GERD (gastroesophageal reflux disease)   . Simple chronic bronchitis ([place])     Past Surgical History:  Procedure Laterality Date  . INGUINAL HERNIA REPAIR       reports that he has been smoking cigarettes. He has been smoking about 1.00 pack per day. He has never used smokeless tobacco. He reports current alcohol use. No history on file for drug.  No Known Allergies  Family History  Problem  Relation Age of Onset  . Cancer Mother      Prior to Admission medications   Medication Sig Start Date End Date Taking? Authorizing Provider  aspirin 325 MG tablet Take 325 mg by mouth daily.   Yes [provider]  metoprolol tartrate (LOPRESSOR) 25 MG tablet Take 1 tablet (25 mg total) by mouth 2 (two) times daily. 11/21/18  Yes Vaughan Basta, MD  Omeprazole 20 MG TBEC Take 20 mg by mouth daily.    Yes [provider]  tamsulosin (FLOMAX) 0.4 MG CAPS capsule Take 1 capsule (0.4 mg total) by mouth daily. 11/21/18  Yes Vaughan Basta, MD    Physical Exam: Vitals:   01/24/19 1926 01/24/19 1930 01/24/19 1947 01/24/19 2100  BP: (!) 141/102  (!) 145/80 125/70  Pulse: 76  93 (!) 35  Resp: 18  16 (!) 28  Temp: (!) 97.5 F (36.4 C)     TempSrc: Oral     SpO2: 95%  93% 93%  Weight:  65.8 kg    Height:  5\' 10"  (1.778 m)        Constitutional: Chronically ill looking, cachectic Vitals:   01/24/19 1926 01/24/19 1930 01/24/19 1947 01/24/19 2100  BP: (!) 141/102  (!) 145/80 125/70  Pulse: 76  93 (!) 35  Resp: 18  16 (!) 28  Temp: (!) 97.5 F (36.4 C)     TempSrc: Oral     SpO2: 95%  93% 93%  Weight:  65.8 kg    Height:  5\' 10"  (1.778 m)     Eyes: PERRL, lids and conjunctivae normal ENMT: Mucous membranes are dry. Posterior pharynx clear of any exudate or lesions.Normal dentition.  Neck: normal, supple, no masses, no thyromegaly Respiratory: clear to auscultation bilaterally, no wheezing, no crackles. Normal respiratory effort. No accessory muscle use.  Cardiovascular: Regular rate and rhythm, no murmurs / rubs / gallops. No extremity edema. 2+ pedal pulses. No carotid bruits.  Abdomen: no tenderness, no masses palpated. No hepatosplenomegaly. Bowel sounds positive.  Musculoskeletal: no clubbing / cyanosis. No joint deformity upper and lower extremities. Good ROM, no contractures. Normal muscle tone.  Skin: no rashes, lesions, ulcers. No  induration Neurologic: CN 2-12 grossly intact. Sensation intact, DTR normal. Strength 5/5 in all 4.  Psychiatric: Normal judgment and insight. Alert and oriented x 3. Normal mood.     Labs on Admission: I have personally reviewed following labs and imaging studies  CBC: Recent Labs  Lab 01/21/19 2119 01/24/19 1935  WBC 10.1 14.4*  HGB 13.6 13.3  HCT 38.5* 38.6*  MCV 82.6 85.0  PLT 259 [id number]   Basic Metabolic Panel: Recent Labs  Lab 01/21/19 2119 01/24/19 1935  NA 130* 133*  K 3.3* 2.7*  CL 90* 91*  CO2 25 27  GLUCOSE 210* 128*  BUN 8 35*  CREATININE 0.76 1.80*  CALCIUM 7.4* 7.0*   GFR: Estimated Creatinine Clearance: 29.4 mL/min (A) (by C-G formula based on SCr of 1.8 mg/dL (H)). Liver Function Tests: No results for input(s): AST, ALT, ALKPHOS, BILITOT, PROT, ALBUMIN in the last 168 hours. No results for input(s): LIPASE, AMYLASE in the last 168 hours. No results for input(s): AMMONIA in the last 168 hours. Coagulation Profile: No results for input(s): INR, PROTIME in the last 168 hours. Cardiac Enzymes: Recent Labs  Lab 01/24/19 1935  CKTOTAL 725*   BNP (last 3 results) No results for input(s): PROBNP in the last 8760 hours. HbA1C: No results for input(s): HGBA1C in the last 72 hours. CBG: No results for input(s): GLUCAP in the last 168 hours. Lipid Profile: No results for input(s): CHOL, HDL, LDLCALC, TRIG, CHOLHDL, LDLDIRECT in the last 72 hours. Thyroid Function Tests: No results for input(s): TSH, T4TOTAL, FREET4, T3FREE, THYROIDAB in the last 72 hours. Anemia Panel: No results for input(s): VITAMINB12, FOLATE, FERRITIN, TIBC, IRON, RETICCTPCT in the last 72 hours. Urine analysis:    Component Value Date/Time   COLORURINE YELLOW (A) 11/17/2018 1900   APPEARANCEUR HAZY (A) 11/17/2018 1900   APPEARANCEUR Cloudy 01/26/2013 1319   LABSPEC 1.018 11/17/2018 1900   LABSPEC 1.009 01/26/2013 1319   PHURINE 5.0 11/17/2018 1900   GLUCOSEU NEGATIVE  11/17/2018 1900   GLUCOSEU Negative 01/26/2013 1319   HGBUR NEGATIVE 11/17/2018 1900   BILIRUBINUR NEGATIVE 11/17/2018 1900   BILIRUBINUR Negative 01/26/2013 1319   KETONESUR NEGATIVE 11/17/2018 1900   PROTEINUR NEGATIVE 11/17/2018 1900   NITRITE NEGATIVE 11/17/2018 1900   LEUKOCYTESUR NEGATIVE 11/17/2018 1900   LEUKOCYTESUR 3+ 01/26/2013 1319   Sepsis Labs: @LABRCNTIP (procalcitonin:4,lacticidven:4) )No results found for this or any previous visit (from the past 240 hour(s)).   Radiological Exams on Admission: DG Chest Port 1 View  Result Date: 01/24/2019 CLINICAL DATA:  Weakness, failure to thrive EXAM: PORTABLE CHEST 1 VIEW COMPARISON:  Radiograph 01/21/2019 FINDINGS: Chronic hyperinflation of the lungs with some coarse interstitial changes which are similar to prior studies. No consolidation, features of edema, pneumothorax, or effusion. The cardiomediastinal contours  are unremarkable. No acute osseous or soft tissue abnormality. Degenerative changes are present in the imaged spine and shoulders. IMPRESSION: Stable appearance of the chest x-ray with chronic interstitial changes and hyperinflation. No acute cardiopulmonary findings are identified. Electronically Signed   By: Lovena Le M.D.   On: 01/24/2019 20:19    Assessment/Plan Principal Problem:   Weakness generalized Active Problems:   A-fib (H[facility])   Rhabdomyolysis   Hypokalemia   Alcoholism ([place])   ARF (acute renal failure) (HCC)   FTT (failure to thrive) in adult     #1 generalized weakness: Patient will be admitted.  It appears to be multifactorial related poor oral intake dehydration among other things.  We will aggressively hydrate the patient.  We will get physical therapy and Occupational Therapy.  May require placement to skilled facility  #2 acute kidney injury: Most likely prerenal aggressively hydrate the patient and monitor renal function  #3 hypokalemia: Replete potassium.  Check magnesium level.  Due to  hypomagnesemia and alcoholic.  #4 alcohol abuse: Initiate CIWA protocol.  #5 mild rhabdomyolysis: Most likely secondary to alcoholism with possible fall.  Hydrate and monitor CPK  #6 A. fib: Patient is in sinus rhythm.   DVT prophylaxis: Lovenox Code Status: Full code Family Communication: No family at bedside Disposition Plan: Possible skilled facility Consults called: None Admission status: Inpatient  Severity of Illness: The appropriate patient status for this patient is INPATIENT. Inpatient status is judged to be reasonable and necessary in order to provide the required intensity of service to ensure the patient's safety. The patient's presenting symptoms, physical exam findings, and initial radiographic and laboratory data in the context of their chronic comorbidities is felt to place them at high risk for further clinical deterioration. Furthermore, it is not anticipated that the patient will be medically stable for discharge from the hospital within 2 midnights of admission. The following factors support the patient status of inpatient.   " The patient's presenting symptoms include generalized weakness. " The worrisome physical exam findings include dry mucous membrane. " The initial radiographic and laboratory data are worrisome because of potassium 2.7. " The chronic co-morbidities include alcoholism.   * I certify that at the point of admission it is my clinical judgment that the patient will require inpatient hospital care spanning beyond 2 midnights from the point of admission due to high intensity of service, high risk for further deterioration and high frequency of surveillance required.Barbette Merino MD [facility] Pager [phone]  If 7PM-7AM, please contact night-coverage [email or web address] Password [facility]  01/24/2019, 9:43 PM

## 2019-01-24 NOTE — ED Triage Notes (Signed)
Pt here via [facility] from home with failure to thrive. Pt was d/c on Mon and lives alone. Pt's son (who states he is the POA) is not comfortable with pt living home alone and is requesting placement. Pt unsteady on feet. Pt had several runs of vtach with ems. Hx of afib.

## 2019-01-24 NOTE — ED Provider Notes (Addendum)
[facility] Emergency Department Provider Note  ____________________________________________   First MD Initiated Contact with Patient 01/24/19 1940     (approximate)  I have reviewed the triage vital signs and the nursing notes.  History  Chief Complaint Weakness    HPI Daniel Edwards is a 84 y.o. male with history of BPH, COPD, GERD, HTN, paroxysmal atrial fibrillation who presents to the ED for generalized weakness, failure to thrive.   Daughter in law states he lives at home and states he is essentially so weak/declined that he barely gets off the couch. He has particularly worsened over the last several days to week. Family feels he likely needs placement to a facility due to inability to care for himself.   Patient does feel generally weak. He has not been eating much. Drinks about 4 [employer] beers a day. He denies any pain. No chest pain, abdominal pain, dysuria. No fevers, cough, trouble breathing.    Past Medical Hx Past Medical History:  Diagnosis Date  . BPH (benign prostatic hyperplasia)   . COPD (chronic obstructive pulmonary disease) ([place])   . Erectile dysfunction   . Essential hypertension   . GERD (gastroesophageal reflux disease)   . Simple chronic bronchitis ([place])     Problem List Patient Active Problem List   Diagnosis Date Noted  . Rhabdomyolysis 11/15/2018  . A-fib ([place]) 07/20/2015  . Chest pain 02/19/2015  . Atypical chest pain 02/19/2015    Past Surgical Hx Past Surgical History:  Procedure Laterality Date  . INGUINAL HERNIA REPAIR      Medications Prior to Admission medications   Medication Sig Start Date End Date Taking? Authorizing Provider  aspirin 325 MG tablet Take 325 mg by mouth daily.    [provider]  metoprolol tartrate (LOPRESSOR) 25 MG tablet Take 1 tablet (25 mg total) by mouth 2 (two) times daily. 11/21/18   Vaughan Basta, MD  nicotine (NICODERM CQ - DOSED IN MG/24 HOURS)  14 mg/24hr patch Place 1 patch (14 mg total) onto the skin daily. 11/22/18   Vaughan Basta, MD  Omeprazole 20 MG TBEC Take 20 mg by mouth daily.     [provider]  oxymetazoline (AFRIN) 0.05 % nasal spray Place 3 sprays into both nostrils 2 (two) times daily for 5 days. 01/22/19 01/27/19  Rudene Re, MD  tamsulosin (FLOMAX) 0.4 MG CAPS capsule Take 1 capsule (0.4 mg total) by mouth daily. 11/21/18   Vaughan Basta, MD    Allergies Patient has no known allergies.  Family Hx Family History  Problem Relation Age of Onset  . Cancer Mother     Social Hx Social History   Tobacco Use  . Smoking status: Current Every Day Smoker    Packs/day: 1.00    Types: Cigarettes  . Smokeless tobacco: Never Used  Substance Use Topics  . Alcohol use: Yes    Alcohol/week: 0.0 standard drinks  . Drug use: Not on file     Review of Systems  Constitutional: Negative for fever, chills. + failure to thrive Eyes: Negative for visual changes. ENT: Negative for sore throat. Cardiovascular: Negative for chest pain. Respiratory: Negative for shortness of breath. Gastrointestinal: Negative for nausea, vomiting.  Genitourinary: Negative for dysuria. Musculoskeletal: Negative for leg swelling. Skin: Negative for rash. Neurological: Negative for for headaches.   Physical Exam  Vital Signs: ED Triage Vitals  Enc Vitals Group     BP 01/24/19 1926 (!) 141/102     Pulse Rate  01/24/19 1926 76     Resp 01/24/19 1926 18     Temp 01/24/19 1926 (!) 97.5 F (36.4 C)     Temp Source 01/24/19 1926 Oral     SpO2 01/24/19 1926 95 %     Weight 01/24/19 1930 145 lb (65.8 kg)     Height 01/24/19 1930 5\' 10"  (1.778 m)     Head Circumference --      Peak Flow --      Pain Score 01/24/19 1930 0     Pain Loc --      Pain Edu? --      Excl. in [place]? --     Constitutional: Alert and oriented. Frail.  Head: Normocephalic. Atraumatic. Eyes: Conjunctivae clear. Sclera  anicteric. Nose: No congestion. No rhinorrhea. Mouth/Throat: Wearing mask. MM dry.  Neck: No stridor.   Cardiovascular: Normal rate, regular rhythm. Extremities well perfused. Respiratory: Normal respiratory effort.  Lungs CTAB. Gastrointestinal: Soft. Non-tender. Non-distended.  Musculoskeletal: No lower extremity edema. No deformities. Neurologic:  Normal speech and language. No gross focal neurologic deficits are appreciated.  Skin: Skin is warm, dry and intact. No rash noted. Psychiatric: Mood and affect are appropriate for situation.  EKG  Personally reviewed.   Rate: 101 Rhythm: sinus Axis: normal Intervals: RBBB, prolonged QTc Right bundle branch block, prolonged QTC, PVC/PACs No STEMI    Radiology  CXR: IMPRESSION:  Stable appearance of the chest x-ray with chronic interstitial  changes and hyperinflation.   No acute cardiopulmonary findings are identified.    Procedures  Procedure(s) performed (including critical care):  .Critical Care Performed by: Lilia Pro., MD Authorized by: Lilia Pro., MD   Critical care provider statement:    Critical care time (minutes):  30   Critical care was necessary to treat or prevent imminent or life-threatening deterioration of the following conditions: severe electrolyte derangements, AKI.   Critical care was time spent personally by me on the following activities:  Discussions with consultants, evaluation of patient's response to treatment, examination of patient, ordering and performing treatments and interventions, ordering and review of laboratory studies, ordering and review of radiographic studies, pulse oximetry, re-evaluation of patient's condition, obtaining history from patient or surrogate and review of old charts     Initial Impression / Assessment and Plan / ED Course  84 y.o. male who presents to the ED for generalized weakness, FTT, as above.  Ddx: dehydration, electrolyte abnormality, AKI, UTI,  PNA, anemia  Labs reveal AKI, creatinine 1.8 from normal. K 2.7. WBC 14.4, XR negative, awaiting urine. Given abnormalities, will initiate IVF, electrolyte repletion, and admit for further correction and placement. Family agreeable w/ plan.    Final Clinical Impression(s) / ED Diagnosis  Final diagnoses:  Dehydration  Hypokalemia  Generalized weakness  AKI (acute kidney injury) ([place])       Note:  This document was prepared using Dragon voice recognition software and may include unintentional dictation errors.   Lilia Pro., MD 01/25/19 Albina Billet    Lilia Pro., MD 01/31/19 1230

## 2019-01-25 ENCOUNTER — Other Ambulatory Visit: Payer: Self-pay

## 2019-01-25 ENCOUNTER — Encounter: Payer: Self-pay | Admitting: Internal Medicine

## 2019-01-25 LAB — URINALYSIS, COMPLETE (UACMP) WITH MICROSCOPIC
Bacteria, UA: NONE SEEN
Bilirubin Urine: NEGATIVE
Glucose, UA: NEGATIVE mg/dL
Hgb urine dipstick: NEGATIVE
Ketones, ur: NEGATIVE mg/dL
Leukocytes,Ua: NEGATIVE
Nitrite: NEGATIVE
Protein, ur: NEGATIVE mg/dL
Specific Gravity, Urine: 1.016 (ref 1.005–1.030)
pH: 5 (ref 5.0–8.0)

## 2019-01-25 LAB — COMPREHENSIVE METABOLIC PANEL
ALT: 23 U/L (ref 0–44)
AST: 46 U/L — ABNORMAL HIGH (ref 15–41)
Albumin: 3.5 g/dL (ref 3.5–5.0)
Alkaline Phosphatase: 51 U/L (ref 38–126)
Anion gap: 13 (ref 5–15)
BUN: 34 mg/dL — ABNORMAL HIGH (ref 8–23)
CO2: 26 mmol/L (ref 22–32)
Calcium: 6.4 mg/dL — CL (ref 8.9–10.3)
Chloride: 98 mmol/L (ref 98–111)
Creatinine, Ser: 1.25 mg/dL — ABNORMAL HIGH (ref 0.61–1.24)
GFR calc Af Amer: >60 mL/min (ref >60)
GFR calc non Af Amer: 53 mL/min — ABNORMAL LOW (ref >60)
Glucose, Bld: 68 mg/dL — ABNORMAL LOW (ref 70–99)
Potassium: 2.9 mmol/L — ABNORMAL LOW (ref 3.5–5.1)
Sodium: 137 mmol/L (ref 135–145)
Total Bilirubin: 0.9 mg/dL (ref 0.3–1.2)
Total Protein: 6.3 g/dL — ABNORMAL LOW (ref 6.5–8.1)

## 2019-01-25 LAB — MAGNESIUM
Magnesium: 0.6 mg/dL — CL (ref 1.7–2.4)
Magnesium: 1.8 mg/dL (ref 1.7–2.4)

## 2019-01-25 LAB — CBC
HCT: 37.5 % — ABNORMAL LOW (ref 39.0–52.0)
Hemoglobin: 13 g/dL (ref 13.0–17.0)
MCH: 29.1 pg (ref 26.0–34.0)
MCHC: 34.7 g/dL (ref 30.0–36.0)
MCV: 84.1 fL (ref 80.0–100.0)
Platelets: 233 10*3/uL (ref 150–400)
RBC: 4.46 MIL/uL (ref 4.22–5.81)
RDW: 13.3 % (ref 11.5–15.5)
WBC: 11.7 10*3/uL — ABNORMAL HIGH (ref 4.0–10.5)
nRBC: 0 % (ref 0.0–0.2)

## 2019-01-25 LAB — POTASSIUM: Potassium: 4.5 mmol/L (ref 3.5–5.1)

## 2019-01-25 LAB — SARS CORONAVIRUS 2 (TAT 6-24 HRS): SARS Coronavirus 2: NEGATIVE

## 2019-01-25 LAB — PHOSPHORUS: Phosphorus: 3.5 mg/dL (ref 2.5–4.6)

## 2019-01-25 MED ORDER — ONDANSETRON HCL 4 MG/2ML IJ SOLN: 4.0000 mg | Freq: Four times a day (QID) | INTRAMUSCULAR | Status: DC | PRN

## 2019-01-25 MED ORDER — LORAZEPAM 1 MG PO TABS: 1.0000 mg | ORAL_TABLET | ORAL | Status: AC | PRN

## 2019-01-25 MED ORDER — POTASSIUM CHLORIDE 10 MEQ/100ML IV SOLN: 10.0000 meq | INTRAVENOUS | Status: DC

## 2019-01-25 MED ORDER — ACETAMINOPHEN 325 MG PO TABS
650.0000 mg | ORAL_TABLET | Freq: Four times a day (QID) | ORAL | Status: DC | PRN
  Administered 2019-01-26: 650 mg via ORAL
  Filled 2019-01-25: qty 2

## 2019-01-25 MED ORDER — MAGNESIUM SULFATE 2 GM/50ML IV SOLN
2.0000 g | Freq: Once | INTRAVENOUS | Status: DC
  Filled 2019-01-25: qty 50

## 2019-01-25 MED ORDER — ACETAMINOPHEN 650 MG RE SUPP: 650.0000 mg | Freq: Four times a day (QID) | RECTAL | Status: DC | PRN

## 2019-01-25 MED ORDER — FOLIC ACID 1 MG PO TABS
1.0000 mg | ORAL_TABLET | Freq: Every day | ORAL | Status: DC
  Administered 2019-01-25 – 2019-01-31 (×7): 1 mg via ORAL
  Filled 2019-01-25 (×7): qty 1

## 2019-01-25 MED ORDER — POTASSIUM CHLORIDE IN NACL 40-0.9 MEQ/L-% IV SOLN
INTRAVENOUS | Status: DC
  Administered 2019-01-25 – 2019-01-26 (×3): 125 mL/h via INTRAVENOUS
  Filled 2019-01-25 (×12): qty 1000

## 2019-01-25 MED ORDER — MAGNESIUM SULFATE 4 GM/100ML IV SOLN
4.0000 g | Freq: Once | INTRAVENOUS | Status: AC
  Administered 2019-01-25: 4 g via INTRAVENOUS
  Filled 2019-01-25: qty 100

## 2019-01-25 MED ORDER — ONDANSETRON HCL 4 MG PO TABS
4.0000 mg | ORAL_TABLET | Freq: Four times a day (QID) | ORAL | Status: DC | PRN
  Filled 2019-01-25: qty 1

## 2019-01-25 MED ORDER — ADULT MULTIVITAMIN W/MINERALS CH
1.0000 | ORAL_TABLET | Freq: Every day | ORAL | Status: DC
  Administered 2019-01-25 – 2019-01-31 (×7): 1 via ORAL
  Filled 2019-01-25 (×7): qty 1

## 2019-01-25 MED ORDER — LOPERAMIDE HCL 2 MG PO CAPS
2.0000 mg | ORAL_CAPSULE | ORAL | Status: DC | PRN
  Administered 2019-01-25 – 2019-01-31 (×3): 2 mg via ORAL
  Filled 2019-01-25 (×3): qty 1

## 2019-01-25 MED ORDER — TAMSULOSIN HCL 0.4 MG PO CAPS
0.4000 mg | ORAL_CAPSULE | Freq: Every day | ORAL | Status: DC
  Administered 2019-01-25 – 2019-01-31 (×7): 0.4 mg via ORAL
  Filled 2019-01-25 (×7): qty 1

## 2019-01-25 MED ORDER — TRAZODONE HCL 50 MG PO TABS
50.0000 mg | ORAL_TABLET | Freq: Every evening | ORAL | Status: DC | PRN
  Administered 2019-01-25 – 2019-01-30 (×4): 50 mg via ORAL
  Filled 2019-01-25 (×4): qty 1

## 2019-01-25 MED ORDER — POTASSIUM CHLORIDE 10 MEQ/100ML IV SOLN
10.0000 meq | INTRAVENOUS | Status: AC
  Administered 2019-01-25 (×4): 10 meq via INTRAVENOUS
  Filled 2019-01-25 (×4): qty 100

## 2019-01-25 MED ORDER — THIAMINE HCL 100 MG PO TABS
100.0000 mg | ORAL_TABLET | Freq: Every day | ORAL | Status: DC
  Administered 2019-01-25 – 2019-01-31 (×7): 100 mg via ORAL
  Filled 2019-01-25 (×7): qty 1

## 2019-01-25 MED ORDER — LORAZEPAM 2 MG/ML IJ SOLN: 1.0000 mg | INTRAMUSCULAR | Status: AC | PRN

## 2019-01-25 MED ORDER — HEPARIN SODIUM (PORCINE) 5000 UNIT/ML IJ SOLN
5000.0000 [IU] | Freq: Three times a day (TID) | INTRAMUSCULAR | Status: DC
  Administered 2019-01-25 – 2019-01-31 (×19): 5000 [IU] via SUBCUTANEOUS
  Filled 2019-01-25 (×20): qty 1

## 2019-01-25 MED ORDER — PANTOPRAZOLE SODIUM 40 MG PO TBEC
40.0000 mg | DELAYED_RELEASE_TABLET | Freq: Every day | ORAL | Status: DC
  Administered 2019-01-25 – 2019-01-31 (×7): 40 mg via ORAL
  Filled 2019-01-25 (×7): qty 1

## 2019-01-25 MED ORDER — POTASSIUM CHLORIDE CRYS ER 20 MEQ PO TBCR
40.0000 meq | EXTENDED_RELEASE_TABLET | Freq: Once | ORAL | Status: AC
  Administered 2019-01-25: 40 meq via ORAL
  Filled 2019-01-25: qty 2

## 2019-01-25 MED ORDER — CALCIUM GLUCONATE-NACL 1-0.675 GM/50ML-% IV SOLN
1.0000 g | Freq: Once | INTRAVENOUS | Status: AC
  Administered 2019-01-25: 09:00:00 1000 mg via INTRAVENOUS
  Filled 2019-01-25: qty 50

## 2019-01-25 MED ORDER — LORAZEPAM 2 MG/ML IJ SOLN: 0.0000 mg | Freq: Two times a day (BID) | INTRAMUSCULAR | Status: AC

## 2019-01-25 MED ORDER — ASPIRIN EC 325 MG PO TBEC
325.0000 mg | DELAYED_RELEASE_TABLET | Freq: Every day | ORAL | Status: DC
  Administered 2019-01-25 – 2019-01-31 (×7): 325 mg via ORAL
  Filled 2019-01-25 (×8): qty 1

## 2019-01-25 MED ORDER — THIAMINE HCL 100 MG/ML IJ SOLN
100.0000 mg | Freq: Every day | INTRAMUSCULAR | Status: DC
  Filled 2019-01-25 (×3): qty 2
  Filled 2019-01-25: qty 1

## 2019-01-25 MED ORDER — METOPROLOL TARTRATE 25 MG PO TABS
25.0000 mg | ORAL_TABLET | Freq: Two times a day (BID) | ORAL | Status: DC
  Administered 2019-01-25 – 2019-01-31 (×12): 25 mg via ORAL
  Filled 2019-01-25 (×3): qty 1
  Filled 2019-01-25: qty 0.5
  Filled 2019-01-25: qty 1
  Filled 2019-01-25: qty 0.5
  Filled 2019-01-25 (×9): qty 1

## 2019-01-25 MED ORDER — LORAZEPAM 2 MG/ML IJ SOLN
0.0000 mg | Freq: Four times a day (QID) | INTRAMUSCULAR | Status: AC
  Administered 2019-01-26: 1 mg via INTRAVENOUS
  Filled 2019-01-25: qty 1

## 2019-01-25 NOTE — Evaluation (Signed)
Physical Therapy Evaluation Patient Details Name: Daniel Edwards MRN: [id number] DOB: 03/07/1934 Today's Date: 01/25/2019   History of Present Illness  Per MD H&P: Pt is an 84 y.o. male with medical history significant of alcoholism, hypertension, atrial fibrillation GERD who was brought in by family due to failing to thrive at home.  He has progressively gotten weaker.  Patient has not been eating or drinking just taking alcohol daily.  Patient has generalized debility to the point that he is not able to move around on his own.  Family believes he is unable to take care of himself at home.  In the ER patient was found to be weak dehydrated acute kidney injury and hypokalemic.  He is being admitted to the hospital for treatment of generalized weakness as well as failure to thrive.  MD assessment includes: weakness, AKI, hypokalemia, alcohol abuse, mild rhabdomyolysis, and A-fib.    Clinical Impression  Pt presented with deficits in strength, transfers, mobility, gait, balance, and activity tolerance.  Pt's Ka 2.9, Ca 6.4 with MD giving verbal order for pt OK to work with PT without restrictions.  Pt motivated to participate with PT services but was ultimately very weak functionally and was tremulous in standing with poor stability.  Pt's activity tolerance was very limited with pt only able to amb a max of 3-4 very small steps at the EOB with almost constant mod A to prevent LOB.  Per patient his son is currently looking into ALF placement.  Pt is at a very high risk for falls and injury and would not be safe to return to his prior living situation at this time.  Pt will benefit from PT services in a SNF setting upon discharge to safely address above deficits for decreased caregiver assistance and eventual return to PLOF.      Follow Up Recommendations SNF    Equipment Recommendations  None recommended by PT    Recommendations for Other Services       Precautions / Restrictions  Precautions Precautions: Fall Restrictions Weight Bearing Restrictions: No      Mobility  Bed Mobility Overal bed mobility: Needs Assistance Bed Mobility: Supine to Sit;Sit to Supine     Supine to sit: Min assist Sit to supine: Min assist   General bed mobility comments: Min A for BLE and trunk positioning  Transfers Overall transfer level: Needs assistance Equipment used: Rolling walker (2 wheeled) Transfers: Sit to/from Stand Sit to Stand: Mod assist         General transfer comment: Mod A to stand and to prevent posterior LOB  Ambulation/Gait Ambulation/Gait assistance: Mod assist Gait Distance (Feet): 2 Feet Assistive device: Rolling walker (2 wheeled) Gait Pattern/deviations: Step-through pattern;Decreased step length - right;Decreased step length - left Gait velocity: decreased   General Gait Details: Mod A for stability and to advance the RW with pt generally tremulous and unsteady in standing  Stairs            Wheelchair Mobility    Modified Rankin (Stroke Patients Only)       Balance Overall balance assessment: Needs assistance Sitting-balance support: Feet supported;Bilateral upper extremity supported Sitting balance-Leahy Scale: Fair     Standing balance support: Bilateral upper extremity supported;During functional activity Standing balance-Leahy Scale: Poor Standing balance comment: Mod A to prevent posterior LOB in standing  Pertinent Vitals/Pain Pain Assessment: No/denies pain    Home Living Family/patient expects to be discharged to:: Private residence Living Arrangements: Alone Available Help at Discharge: Family;Available PRN/intermittently Type of Home: House Home Access: Level entry     Home Layout: One level Home Equipment: Walker - 2 wheels;Cane - single point      Prior Function Level of Independence: Independent         Comments: Ind Amb without an AD, one fall in the  last 6 months, Ind with ADLs.  Per patient son is looking into ALF for patient.     Hand Dominance        Extremity/Trunk Assessment   Upper Extremity Assessment Upper Extremity Assessment: Generalized weakness    Lower Extremity Assessment Lower Extremity Assessment: Generalized weakness       Communication   Communication: No difficulties  Cognition Arousal/Alertness: Awake/alert Behavior During Therapy: WFL for tasks assessed/performed Overall Cognitive Status: Within Functional Limits for tasks assessed                                        General Comments      Exercises Total Joint Exercises Ankle Circles/Pumps: Strengthening;Both;10 reps;5 reps Quad Sets: Strengthening;Both;5 reps;10 reps Gluteal Sets: Strengthening;Both;5 reps;10 reps Short Arc Quad: Strengthening;Both;10 reps Heel Slides: AROM;Both;10 reps Hip ABduction/ADduction: AROM;Both;10 reps Long Arc Quad: Strengthening;Both;10 reps Knee Flexion: Strengthening;Both;10 reps Marching in Standing: AROM;Both;5 reps Other Exercises Other Exercises: HEP education for BLE APs, QS, GS, and LAQs x 10 each 5-6x/day Other Exercises: Anterior weight shifting activities in standing to address posterior instability   Assessment/Plan    PT Assessment Patient needs continued PT services  PT Problem List Decreased strength;Decreased activity tolerance;Decreased balance;Decreased mobility;Decreased knowledge of use of DME       PT Treatment Interventions DME instruction;Gait training;Functional mobility training;Therapeutic activities;Therapeutic exercise;Balance training;Patient/family education    PT Goals (Current goals can be found in the Care Plan section)  Acute Rehab PT Goals Patient Stated Goal: To walk better and get stronger PT Goal Formulation: With patient Time For Goal Achievement: 02/07/19 Potential to Achieve Goals: Good    Frequency Min 2X/week   Barriers to discharge  Inaccessible home environment;Decreased caregiver support      Co-evaluation               AM-PAC PT "6 Clicks" Mobility  Outcome Measure Help needed turning from your back to your side while in a flat bed without using bedrails?: A Little Help needed moving from lying on your back to sitting on the side of a flat bed without using bedrails?: A Little Help needed moving to and from a bed to a chair (including a wheelchair)?: A Lot Help needed standing up from a chair using your arms (e.g., wheelchair or bedside chair)?: A Lot Help needed to walk in hospital room?: A Lot Help needed climbing 3-5 steps with a railing? : Total 6 Click Score: 13    End of Session Equipment Utilized During Treatment: Gait belt;Oxygen Activity Tolerance: Patient tolerated treatment well Patient left: in bed;with call bell/phone within reach Nurse Communication: Mobility status PT Visit Diagnosis: Unsteadiness on feet (R26.81);Muscle weakness (generalized) (M62.81);Difficulty in walking, not elsewhere classified (R26.2)    Time: [id number] PT Time Calculation (min) (ACUTE ONLY): 32 min   Charges:   PT Evaluation $PT Eval Moderate Complexity: 1 Mod PT Treatments $Therapeutic Exercise: 8-22 mins  Linus Salmons PT, DPT 01/25/19, 4:15 PM

## 2019-01-25 NOTE — ED Notes (Signed)
Pt given lunch tray.

## 2019-01-25 NOTE — Progress Notes (Signed)
PROGRESS NOTE    Daniel Edwards  [id number] DOB: [birth year] DOA: 01/24/2019 PCP: Baxter Hire, MD   Brief Narrative:  Daniel Edwards is a 84 y.o. male with medical history significant of alcoholism, hypertension, atrial fibrillation GERD who was brought in by family due to failing to thrive at home.  He has progressively gotten weaker.  Patient has not been eating or drinking just taking alcohol daily.  Patient has generalized debility to the point that he is not able to move around on his own.  Family believes he is unable to take care of himself at home.  He lives alone.  In the ER patient was found to be weak dehydrated acute kidney injury and hypokalemic.  He is being admitted to the hospital for treatment of generalized weakness as well as failure to thrive..  12/18: Patient resting in bed.  He reports improvement in his weakness.  No other acute changes.    Assessment & Plan:   Principal Problem:   Weakness generalized Active Problems:   A-fib (HCC)   Rhabdomyolysis   Hypokalemia   Alcoholism ([place])   ARF (acute renal failure) (HCC)   FTT (failure to thrive) in adult  #1 generalized weakness:  Multifactorial likely owing to impaired dietary intake as well as alcohol intake We will aggressively IV resuscitate the patient Replete electrolytes as necessary Therapy and Occupational Therapy consults Nutrition consult Social services consult  #2 acute kidney injury:  Most likely prerenal  aggressively hydrate the patient and monitor renal function  #3 hypokalemia Replete potassium  #4 alcohol abuse: Initiate CIWA protocol.  #5 mild rhabdomyolysis:  Most likely secondary to alcoholism with possible fall.   Hydrate and monitor CPK  #6 A. fib: Patient is in sinus rhythm.  DVT prophylaxis: SQH Code Status: FULL  Family Communication: none Disposition Plan: SNF   Consultants:   none  Procedures: none   Antimicrobials:  none   Subjective: Seen  and examined No acute events Feels ok, just weak  Objective: Vitals:   01/25/19 0840 01/25/19 1130 01/25/19 1350 01/25/19 1556  BP: (!) 138/93 108/74 128/80 115/65  Pulse:  73 81 79  Resp:  14  18  Temp:    (!) 97.5 F (36.4 C)  TempSrc:    Oral  SpO2:  99%  92%  Weight:      Height:        Intake/Output Summary (Last 24 hours) at 01/25/2019 1703 Last data filed at 01/25/2019 0925 Gross per 24 hour  Intake 1000 ml  Output 1 ml  Net 999 ml   Filed Weights   01/24/19 1930  Weight: 65.8 kg    Examination:  General exam: Appears calm and comfortable.  Appears dry Respiratory system: Clear to auscultation. Respiratory effort normal. Cardiovascular system: S1 & S2 heard, RRR. No JVD, murmurs, rubs, gallops or clicks. Gastrointestinal system: Abdomen is nondistended, soft and nontender. Thin Central nervous system: Alert and oriented. No focal neurological deficits. Extremities: Decreased strength Skin: Dry and pale Psychiatry: Judgement and insight appear normal. Mood & affect appropriate.     Data Reviewed: I have personally reviewed following labs and imaging studies  CBC: Recent Labs  Lab 01/21/19 2119 01/24/19 1935 01/25/19 0733  WBC 10.1 14.4* 11.7*  HGB 13.6 13.3 13.0  HCT 38.5* 38.6* 37.5*  MCV 82.6 85.0 84.1  PLT 259 279 0000000   Basic Metabolic Panel: Recent Labs  Lab 01/21/19 2119 01/24/19 1935 01/25/19 0733  NA 130* 133* 137  K 3.3* 2.7* 2.9*  CL 90* 91* 98  CO2 25 27 26   GLUCOSE 210* 128* 68*  BUN 8 35* 34*  CREATININE 0.76 1.80* 1.25*  CALCIUM 7.4* 7.0* 6.4*  MG  --   --  0.6*  PHOS  --   --  3.5   GFR: Estimated Creatinine Clearance: 42.4 mL/min (A) (by C-G formula based on SCr of 1.25 mg/dL (H)). Liver Function Tests: Recent Labs  Lab 01/25/19 0733  AST 46*  ALT 23  ALKPHOS 51  BILITOT 0.9  PROT 6.3*  ALBUMIN 3.5   No results for input(s): LIPASE, AMYLASE in the last 168 hours. No results for input(s): AMMONIA in the last  168 hours. Coagulation Profile: No results for input(s): INR, PROTIME in the last 168 hours. Cardiac Enzymes: Recent Labs  Lab 01/24/19 1935  CKTOTAL 725*   BNP (last 3 results) No results for input(s): PROBNP in the last 8760 hours. HbA1C: No results for input(s): HGBA1C in the last 72 hours. CBG: No results for input(s): GLUCAP in the last 168 hours. Lipid Profile: No results for input(s): CHOL, HDL, LDLCALC, TRIG, CHOLHDL, LDLDIRECT in the last 72 hours. Thyroid Function Tests: No results for input(s): TSH, T4TOTAL, FREET4, T3FREE, THYROIDAB in the last 72 hours. Anemia Panel: No results for input(s): VITAMINB12, FOLATE, FERRITIN, TIBC, IRON, RETICCTPCT in the last 72 hours. Sepsis Labs: No results for input(s): PROCALCITON, LATICACIDVEN in the last 168 hours.  Recent Results (from the past 240 hour(s))  SARS CORONAVIRUS 2 (TAT 6-24 HRS) Nasopharyngeal Nasopharyngeal Swab     Status: None   Collection Time: 01/24/19 10:14 PM   Specimen: Nasopharyngeal Swab  Result Value Ref Range Status   SARS Coronavirus 2 NEGATIVE NEGATIVE Final    Comment: (NOTE) SARS-CoV-2 target nucleic acids are NOT DETECTED. The SARS-CoV-2 RNA is generally detectable in upper and lower respiratory specimens during the acute phase of infection. Negative results do not preclude SARS-CoV-2 infection, do not rule out co-infections with other pathogens, and should not be used as the sole basis for treatment or other patient management decisions. Negative results must be combined with clinical observations, patient history, and epidemiological information. The expected result is Negative. Fact Sheet for Patients: SugarRoll.be Fact Sheet for Healthcare Providers: https://www.woods-mathews.com/ This test is not yet approved or cleared by the [place] FDA and  has been authorized for detection and/or diagnosis of SARS-CoV-2 by FDA under an Emergency Use  Authorization (EUA). This EUA will remain  in effect (meaning this test can be used) for the duration of the COVID-19 declaration under Section 56 4(b)(1) of the Act, 21 U.S.C. section 360bbb-3(b)(1), unless the authorization is terminated or revoked sooner. Performed at [facility], [place] [address] [place], [place] [address]          Radiology Studies: DG Chest Port 1 View  Result Date: 01/24/2019 CLINICAL DATA:  Weakness, failure to thrive EXAM: PORTABLE CHEST 1 VIEW COMPARISON:  Radiograph 01/21/2019 FINDINGS: Chronic hyperinflation of the lungs with some coarse interstitial changes which are similar to prior studies. No consolidation, features of edema, pneumothorax, or effusion. The cardiomediastinal contours are unremarkable. No acute osseous or soft tissue abnormality. Degenerative changes are present in the imaged spine and shoulders. IMPRESSION: Stable appearance of the chest x-ray with chronic interstitial changes and hyperinflation. No acute cardiopulmonary findings are identified. Electronically Signed   By: Lovena Le M.D.   On: 01/24/2019 20:19        Scheduled Meds: . aspirin  EC  325 mg Oral Daily  . folic acid  1 mg Oral Daily  . heparin  5,000 Units Subcutaneous Q8H  . LORazepam  0-4 mg Intravenous Q6H   Followed by  . [START ON 01/27/2019] LORazepam  0-4 mg Intravenous Q12H  . metoprolol tartrate  25 mg Oral BID  . multivitamin with minerals  1 tablet Oral Daily  . pantoprazole  40 mg Oral Daily  . tamsulosin  0.4 mg Oral Daily  . thiamine  100 mg Oral Daily   Or  . thiamine  100 mg Intravenous Daily   Continuous Infusions: . 0.9 % NaCl with KCl 40 mEq / L Stopped (01/25/19 1516)  . [COMPLETED] potassium chloride Stopped (01/25/19 1502)     LOS: 1 day    Time spent: 35 minutes    Sidney Ace, MD [facility] Pager [phone]  If 7PM-7AM, please contact night-coverage [email or web address] Password [email or web address] 01/25/2019, 5:03 PM

## 2019-01-25 NOTE — ED Notes (Signed)
Report given to Erica, RN

## 2019-01-25 NOTE — Progress Notes (Signed)
Daniel Mare NP about patient's request to have sleeping aid, trazodone ordered given.

## 2019-01-25 NOTE — Progress Notes (Signed)
Pharmacy Electrolyte Monitoring Consult:  Pharmacy consulted to assist in monitoring and replacing electrolytes in this 84 y.o. male admitted on 01/24/2019 with Weakness  -ETOH abuse  Labs:  Sodium (mmol/L)  Date Value  01/25/2019 137  02/23/2013 130 (L)   Potassium (mmol/L)  Date Value  01/25/2019 2.9 (L)  02/23/2013 3.6   Magnesium (mg/dL)  Date Value  01/25/2019 0.6 (LL)  01/28/2013 2.0   Phosphorus (mg/dL)  Date Value  01/25/2019 3.5  12/14/2012 3.2   Calcium (mg/dL)  Date Value  01/25/2019 6.4 (LL)   Calcium, Total (mg/dL)  Date Value  02/23/2013 8.4 (L)   Albumin (g/dL)  Date Value  01/25/2019 3.5  01/26/2013 2.5 (L)    Assessment/Plan: K 2.9  Mag 0.6  Phos 3.5  Ca 6.4 Scr 1.25 Patient currently on IVF NS w/ KCL 40 meq/L at 125 ml/hr -Will order Magnesium sulfate 4 gram IV x 1 -Will order KCL 10 meq IV x 4 and KCL 40 meq po x 1 -MD has ordered Calcium gluconate 1 gm IV x 1 (would correct hypomagnesemia and then reassess for further supplementation)  Will recheck K/Mag at 1800 and electrolytes with am labs.  Daniel Edwards 01/25/2019 8:56 AM

## 2019-01-25 NOTE — ED Notes (Signed)
pericare provided, pt clean and dry at this time.

## 2019-01-26 LAB — BASIC METABOLIC PANEL
Anion gap: 6 (ref 5–15)
BUN: 33 mg/dL — ABNORMAL HIGH (ref 8–23)
CO2: 23 mmol/L (ref 22–32)
Calcium: 6.7 mg/dL — ABNORMAL LOW (ref 8.9–10.3)
Chloride: 111 mmol/L (ref 98–111)
Creatinine, Ser: 0.9 mg/dL (ref 0.61–1.24)
GFR calc Af Amer: >60 mL/min (ref >60)
GFR calc non Af Amer: >60 mL/min (ref >60)
Glucose, Bld: 64 mg/dL — ABNORMAL LOW (ref 70–99)
Potassium: 5.6 mmol/L — ABNORMAL HIGH (ref 3.5–5.1)
Sodium: 140 mmol/L (ref 135–145)

## 2019-01-26 MED ORDER — SODIUM CHLORIDE 0.9 % IV SOLN: INTRAVENOUS | Status: DC

## 2019-01-26 NOTE — Progress Notes (Signed)
Pharmacy Electrolyte Monitoring Consult:  Pharmacy consulted to assist in monitoring and replacing electrolytes in this 84 y.o. male admitted on 01/24/2019 with Weakness  -ETOH abuse  Labs:  Sodium (mmol/L)  Date Value  01/26/2019 140  02/23/2013 130 (L)   Potassium (mmol/L)  Date Value  01/26/2019 5.6 (H)  02/23/2013 3.6   Magnesium (mg/dL)  Date Value  01/25/2019 1.8  01/28/2013 2.0   Phosphorus (mg/dL)  Date Value  01/25/2019 3.5  12/14/2012 3.2   Calcium (mg/dL)  Date Value  01/26/2019 6.7 (L)   Calcium, Total (mg/dL)  Date Value  02/23/2013 8.4 (L)   Albumin (g/dL)  Date Value  01/25/2019 3.5  01/26/2013 2.5 (L)    Assessment/Plan:  No supplementation is warranted at this time.  Will recheck electrolytes with am labs.  Olivia Canter, [facility] 01/26/2019 12:42 PM

## 2019-01-26 NOTE — Progress Notes (Signed)
PROGRESS NOTE    Daniel Edwards  [id number] DOB: [birth year] DOA: 01/24/2019 PCP: Baxter Hire, MD   Brief Narrative:  Daniel Edwards is a 84 y.o. male with medical history significant of alcoholism, hypertension, atrial fibrillation GERD who was brought in by family due to failing to thrive at home.  He has progressively gotten weaker.  Patient has not been eating or drinking just taking alcohol daily.  Patient has generalized debility to the point that he is not able to move around on his own.  Family believes he is unable to take care of himself at home.  He lives alone.  In the ER patient was found to be weak dehydrated acute kidney injury and hypokalemic.  He is being admitted to the hospital for treatment of generalized weakness as well as failure to thrive..  12/18: Patient resting in bed.  He reports improvement in his weakness.  No other acute changes.  12/19: No changes.  Discussed physical therapy recommendation skilled nursing facility.  Patient adamantly refusing.    Assessment & Plan:   Principal Problem:   Weakness generalized Active Problems:   A-fib (HCC)   Rhabdomyolysis   Hypokalemia   Alcoholism ([place])   ARF (acute renal failure) (HCC)   FTT (failure to thrive) in adult  #1 generalized weakness:  Multifactorial likely owing to impaired dietary intake as well as alcohol intake DC IVF, creatinine improved and patient tolerating PO Replete electrolytes as necessary Therapy and Occupational Therapy consults: rec SNF but patient refusing Nutrition consult Social services consult  #2 acute kidney injury: resolved Most likely prerenal  DC IVF  #3 hypokalemia Replete potassium  #4 alcohol abuse: Initiate CIWA protocol.  #5 mild rhabdomyolysis:  Most likely secondary to alcoholism with possible fall.   Hydrate and monitor CPK  #6 A. fib: Patient is in sinus rhythm.  DVT prophylaxis: SQH Code Status: FULL  Family Communication:  none Disposition Plan: SNF vs home health   Consultants:   none  Procedures: none   Antimicrobials:  none   Subjective: Seen and examined No acute events Feels ok, just weak  Objective: Vitals:   01/26/19 0351 01/26/19 0406 01/26/19 0822 01/26/19 1129  BP: 131/64  125/73   Pulse: 75  65   Resp: (!) 22  18 18   Temp: 98 F (36.7 C)  97.9 F (36.6 C)   TempSrc: Oral  Oral   SpO2: 96%  96% 94%  Weight:  57.4 kg    Height:        Intake/Output Summary (Last 24 hours) at 01/26/2019 1458 Last data filed at 01/26/2019 1215 Gross per 24 hour  Intake 1240 ml  Output 750 ml  Net 490 ml   Filed Weights   01/24/19 1930 01/26/19 0406  Weight: 65.8 kg 57.4 kg    Examination:  General exam: Appears calm and comfortable.  Appears dry Respiratory system: Clear to auscultation. Respiratory effort normal. Cardiovascular system: S1 & S2 heard, RRR. No JVD, murmurs, rubs, gallops or clicks. Gastrointestinal system: Abdomen is nondistended, soft and nontender. Thin Central nervous system: Alert and oriented. No focal neurological deficits. Extremities: Decreased strength Skin: Dry and pale Psychiatry: Judgement and insight appear normal. Mood & affect appropriate.     Data Reviewed: I have personally reviewed following labs and imaging studies  CBC: Recent Labs  Lab 01/21/19 2119 01/24/19 1935 01/25/19 0733  WBC 10.1 14.4* 11.7*  HGB 13.6 13.3 13.0  HCT 38.5* 38.6* 37.5*  MCV 82.6 85.0  84.1  PLT 259 279 0000000   Basic Metabolic Panel: Recent Labs  Lab 01/21/19 2119 01/24/19 1935 01/25/19 0733 01/25/19 1808 01/26/19 0615  NA 130* 133* 137  --  140  K 3.3* 2.7* 2.9* 4.5 5.6*  CL 90* 91* 98  --  111  CO2 25 27 26   --  23  GLUCOSE 210* 128* 68*  --  64*  BUN 8 35* 34*  --  33*  CREATININE 0.76 1.80* 1.25*  --  0.90  CALCIUM 7.4* 7.0* 6.4*  --  6.7*  MG  --   --  0.6* 1.8  --   PHOS  --   --  3.5  --   --    GFR: Estimated Creatinine Clearance: 51.4  mL/min (by C-G formula based on SCr of 0.9 mg/dL). Liver Function Tests: Recent Labs  Lab 01/25/19 0733  AST 46*  ALT 23  ALKPHOS 51  BILITOT 0.9  PROT 6.3*  ALBUMIN 3.5   No results for input(s): LIPASE, AMYLASE in the last 168 hours. No results for input(s): AMMONIA in the last 168 hours. Coagulation Profile: No results for input(s): INR, PROTIME in the last 168 hours. Cardiac Enzymes: Recent Labs  Lab 01/24/19 1935  CKTOTAL 725*   BNP (last 3 results) No results for input(s): PROBNP in the last 8760 hours. HbA1C: No results for input(s): HGBA1C in the last 72 hours. CBG: No results for input(s): GLUCAP in the last 168 hours. Lipid Profile: No results for input(s): CHOL, HDL, LDLCALC, TRIG, CHOLHDL, LDLDIRECT in the last 72 hours. Thyroid Function Tests: No results for input(s): TSH, T4TOTAL, FREET4, T3FREE, THYROIDAB in the last 72 hours. Anemia Panel: No results for input(s): VITAMINB12, FOLATE, FERRITIN, TIBC, IRON, RETICCTPCT in the last 72 hours. Sepsis Labs: No results for input(s): PROCALCITON, LATICACIDVEN in the last 168 hours.  Recent Results (from the past 240 hour(s))  SARS CORONAVIRUS 2 (TAT 6-24 HRS) Nasopharyngeal Nasopharyngeal Swab     Status: None   Collection Time: 01/24/19 10:14 PM   Specimen: Nasopharyngeal Swab  Result Value Ref Range Status   SARS Coronavirus 2 NEGATIVE NEGATIVE Final    Comment: (NOTE) SARS-CoV-2 target nucleic acids are NOT DETECTED. The SARS-CoV-2 RNA is generally detectable in upper and lower respiratory specimens during the acute phase of infection. Negative results do not preclude SARS-CoV-2 infection, do not rule out co-infections with other pathogens, and should not be used as the sole basis for treatment or other patient management decisions. Negative results must be combined with clinical observations, patient history, and epidemiological information. The expected result is Negative. Fact Sheet for  Patients: SugarRoll.be Fact Sheet for Healthcare Providers: https://www.woods-mathews.com/ This test is not yet approved or cleared by the [place] FDA and  has been authorized for detection and/or diagnosis of SARS-CoV-2 by FDA under an Emergency Use Authorization (EUA). This EUA will remain  in effect (meaning this test can be used) for the duration of the COVID-19 declaration under Section 56 4(b)(1) of the Act, 21 U.S.C. section 360bbb-3(b)(1), unless the authorization is terminated or revoked sooner. Performed at [facility], [place] [address] [place], [place] [address]          Radiology Studies: DG Chest Port 1 View  Result Date: 01/24/2019 CLINICAL DATA:  Weakness, failure to thrive EXAM: PORTABLE CHEST 1 VIEW COMPARISON:  Radiograph 01/21/2019 FINDINGS: Chronic hyperinflation of the lungs with some coarse interstitial changes which are similar to prior studies. No consolidation, features of edema,  pneumothorax, or effusion. The cardiomediastinal contours are unremarkable. No acute osseous or soft tissue abnormality. Degenerative changes are present in the imaged spine and shoulders. IMPRESSION: Stable appearance of the chest x-ray with chronic interstitial changes and hyperinflation. No acute cardiopulmonary findings are identified. Electronically Signed   By: Lovena Le M.D.   On: 01/24/2019 20:19        Scheduled Meds: . aspirin EC  325 mg Oral Daily  . folic acid  1 mg Oral Daily  . heparin  5,000 Units Subcutaneous Q8H  . LORazepam  0-4 mg Intravenous Q6H   Followed by  . [START ON 01/27/2019] LORazepam  0-4 mg Intravenous Q12H  . metoprolol tartrate  25 mg Oral BID  . multivitamin with minerals  1 tablet Oral Daily  . pantoprazole  40 mg Oral Daily  . tamsulosin  0.4 mg Oral Daily  . thiamine  100 mg Oral Daily   Or  . thiamine  100 mg Intravenous Daily   Continuous Infusions:    LOS: 2 days    Time  spent: 35 minutes    Sidney Ace, MD [facility] Pager [phone]  If 7PM-7AM, please contact night-coverage [email or web address] Password [email or web address] 01/26/2019, 2:58 PM

## 2019-01-27 DIAGNOSIS — E43 Unspecified severe protein-calorie malnutrition: Secondary | ICD-10-CM | POA: Insufficient documentation

## 2019-01-27 LAB — BASIC METABOLIC PANEL
Anion gap: 6 (ref 5–15)
BUN: 28 mg/dL — ABNORMAL HIGH (ref 8–23)
CO2: 23 mmol/L (ref 22–32)
Calcium: 6.4 mg/dL — CL (ref 8.9–10.3)
Chloride: 111 mmol/L (ref 98–111)
Creatinine, Ser: 0.84 mg/dL (ref 0.61–1.24)
GFR calc Af Amer: >60 mL/min (ref >60)
GFR calc non Af Amer: >60 mL/min (ref >60)
Glucose, Bld: 75 mg/dL (ref 70–99)
Potassium: 4.2 mmol/L (ref 3.5–5.1)
Sodium: 140 mmol/L (ref 135–145)

## 2019-01-27 MED ORDER — LOPERAMIDE HCL 2 MG PO CAPS: 2.0000 mg | ORAL_CAPSULE | ORAL | 0 refills | Status: DC | PRN

## 2019-01-27 MED ORDER — ADULT MULTIVITAMIN W/MINERALS CH: 1.0000 | ORAL_TABLET | Freq: Every day | ORAL | Status: DC

## 2019-01-27 MED ORDER — ENSURE ENLIVE PO LIQD: 237.0000 mL | Freq: Three times a day (TID) | ORAL | 12 refills | Status: DC

## 2019-01-27 MED ORDER — SODIUM CHLORIDE 0.9% FLUSH
10.0000 mL | Freq: Two times a day (BID) | INTRAVENOUS | Status: DC
  Administered 2019-01-27 – 2019-01-31 (×8): 10 mL via INTRAVENOUS

## 2019-01-27 MED ORDER — ENSURE ENLIVE PO LIQD
237.0000 mL | Freq: Three times a day (TID) | ORAL | Status: DC
  Administered 2019-01-28 – 2019-01-31 (×5): 237 mL via ORAL

## 2019-01-27 MED ORDER — THIAMINE HCL 100 MG PO TABS: 100.0000 mg | ORAL_TABLET | Freq: Every day | ORAL | Status: DC

## 2019-01-27 MED ORDER — FOLIC ACID 1 MG PO TABS: 1.0000 mg | ORAL_TABLET | Freq: Every day | ORAL | Status: DC

## 2019-01-27 NOTE — Progress Notes (Addendum)
Initial Nutrition Assessment  DOCUMENTATION CODES:   Severe malnutrition in context of chronic illness, Underweight  INTERVENTION:  Provide Ensure Enlive po TID, each supplement provides 350 kcal and 20 grams of protein. Patient prefers chocolate.  Continue MVI daily, thiamine 195 mg daily, folic acid 1 mg daily.  Encouraged adequate intake of calories and protein from meals, snacks, and oral nutrition supplements.  Monitor magnesium, potassium, and phosphorus daily for at least 3 days, MD to replete as needed, as pt is at risk for refeeding syndrome given severe malnutrition and hx of EtOH abuse. Noted pharmacy has already been consulted for electrolyte management.  NUTRITION DIAGNOSIS:   Severe Malnutrition related to chronic illness(COPD, EtOH abuse) as evidenced by severe fat depletion, severe muscle depletion.  GOAL:   Patient will meet greater than or equal to 90% of their needs  MONITOR:   PO intake, Supplement acceptance, Labs, Weight trends, I & O's  REASON FOR ASSESSMENT:   Consult Assessment of nutrition requirement/status  ASSESSMENT:   84 year old male with PMHx of HTN, GERD, bronchitis, COPD, BPH, EtOH abuse admitted with generalized weakness, AKI, mild rhabdomyolysis, FTT.   Met with patient at bedside. He is reporting he has a good appetite and intake now and at baseline. He reports eating 3-4 meals daily. His son does his grocery shopping. He reports that he typically has fish or meat with vegetables and fruits. He is eating fairly well here. Yesterday he ate 100% of breakfast but 0% of lunch. Today he had 85% of breakfast and had finished about 90% of his lunch at time of RD assessment. He is amenable to drinking chocolate oral nutrition supplement to help meet calorie/protein needs.  According to H&P family reporting patient has not been eating or drinking during the day except for alcohol. The family believes he is unable to take care of himself at home. PT  recommending SNF but patient is refusing.  Patient reports his UBW is 155 lbs and denies any weight loss. Discussed with patient that he is currently on 57.4 kg (126.54 lbs). He was unaware he had lost that much weight and is unsure when the weight loss occurred.   Medications reviewed and include: folic acid 1 mg daily, Ativan for CIWA, MVI daily, pantoprazole, Flomax, thiamine 100 mg daily.  Labs reviewed: BUN 28.  NUTRITION - FOCUSED PHYSICAL EXAM:    Most Recent Value  Orbital Region  Severe depletion  Upper Arm Region  Severe depletion  Thoracic and Lumbar Region  Severe depletion  Buccal Region  Severe depletion  Temple Region  Severe depletion  Clavicle Bone Region  Severe depletion  Clavicle and Acromion Bone Region  Severe depletion  Scapular Bone Region  Severe depletion  Dorsal Hand  Severe depletion  Patellar Region  Severe depletion  Anterior Thigh Region  Severe depletion  Posterior Calf Region  Severe depletion  Edema (RD Assessment)  None  Hair  Reviewed  Eyes  Reviewed  Mouth  Reviewed  Skin  Reviewed  Nails  Reviewed     Diet Order:   Diet Order            Diet regular Room service appropriate? Yes; Fluid consistency: Thin  Diet effective now             EDUCATION NEEDS:   Education needs have been addressed  Skin:  Skin Assessment: Skin Integrity Issues:(MSAD to coccyx)  Last BM:  01/26/2019 - smear  Height:   Ht Readings from Last  1 Encounters:  01/24/19 5' 10"  (1.778 m)   Weight:   Wt Readings from Last 1 Encounters:  01/26/19 57.4 kg   Ideal Body Weight:  75.5 kg  BMI:  Body mass index is 18.16 kg/m.  Estimated Nutritional Needs:   Kcal:  1600-1800  Protein:  80-90 grams  Fluid:  1.6-1.8 L/day  Jacklynn Barnacle, MS, RD, LDN Office: [phone] Pager: [phone] After Hours/Weekend Pager: [phone]

## 2019-01-27 NOTE — Progress Notes (Signed)
Pharmacy Electrolyte Monitoring Consult:  Pharmacy consulted to assist in monitoring and replacing electrolytes in this 84 y.o. male admitted on 01/24/2019 with Weakness  -ETOH abuse  Labs:  Sodium (mmol/L)  Date Value  01/27/2019 140  02/23/2013 130 (L)   Potassium (mmol/L)  Date Value  01/27/2019 4.2  02/23/2013 3.6   Magnesium (mg/dL)  Date Value  01/25/2019 1.8  01/28/2013 2.0   Phosphorus (mg/dL)  Date Value  01/25/2019 3.5  12/14/2012 3.2   Calcium (mg/dL)  Date Value  01/27/2019 6.4 (LL)   Calcium, Total (mg/dL)  Date Value  02/23/2013 8.4 (L)   Albumin (g/dL)  Date Value  01/25/2019 3.5  01/26/2013 2.5 (L)    Assessment/Plan:  No supplementation is warranted at this time.  Will recheck electrolytes with am labs.  Olivia Canter, [facility] 01/27/2019 8:30 AM

## 2019-01-27 NOTE — Plan of Care (Signed)
  Problem: Safety: Goal: Ability to remain free from injury will improve Outcome: Progressing   

## 2019-01-27 NOTE — Progress Notes (Signed)
Lab called and reported a calcium 6.4. Notify MD and incoming shift. Will continue to monitor

## 2019-01-27 NOTE — Progress Notes (Signed)
PROGRESS NOTE    Daniel Edwards  [id number] DOB: March 06, 1934 DOA: 01/24/2019 PCP: Baxter Hire, MD   Brief Narrative:  Daniel Edwards is a 84 y.o. male with medical history significant of alcoholism, hypertension, atrial fibrillation GERD who was brought in by family due to failing to thrive at home.  He has progressively gotten weaker.  Patient has not been eating or drinking just taking alcohol daily.  Patient has generalized debility to the point that he is not able to move around on his own.  Family believes he is unable to take care of himself at home.  He lives alone.  In the ER patient was found to be weak dehydrated acute kidney injury and hypokalemic.  He is being admitted to the hospital for treatment of generalized weakness as well as failure to thrive..  12/18: Patient resting in bed.  He reports improvement in his weakness.  No other acute changes.  12/19: No changes.  Discussed physical therapy recommendation skilled nursing facility.  Patient adamantly refusing.  12/20: No acute status changes.  Patient continues to be reluctant about skilled nursing facility placement.  Spoke at length with son via phone.  Brad at [phone].  Patient's son and his wife explained that patient's current living situation is unsafe to return to.      Assessment & Plan:   Principal Problem:   Weakness generalized Active Problems:   A-fib (HCC)   Rhabdomyolysis   Hypokalemia   Alcoholism ([place])   ARF (acute renal failure) (HCC)   FTT (failure to thrive) in adult   Protein-calorie malnutrition, severe  #1 generalized weakness Multifactorial likely owing to impaired dietary intake as well as alcohol intake DC IVF, creatinine improved and patient tolerating PO Replete electrolytes as necessary Therapy and Occupational Therapy consults: rec SNF Nutrition consult: Severe malnutrition noted Social services consult  #2 acute kidney injury: resolved Most likely prerenal  DC  IVF  #3 hypokalemia Replete potassium  #4 alcohol abuse:  Initiate CIWA protocol. Per patient's son, the patient drinks approximately 6-8 beers a day and has been doing so for quite a while.  Only recently has statin drinking a little less because he is unable to go to the store to get the beer.  #5 mild rhabdomyolysis:  Most likely secondary to alcoholism with possible fall.   Hydrate and monitor CPK  #6 A. fib: Patient is in sinus rhythm.  #7 Severe protein calorie malnutrition Likely owing to chronic alcohol abuse Social services consult Nutrition consult Patient will benefit from skilled nursing facility versus long-term care placement  Spoke at length with the patient's son Daniel Edwards via phone [phone]).  Daniel Edwards and his wife explained that the patient is not capable of caring for himself at home.  His house is apparently in disarray and represents an unsafe living condition and he cannot return there.  Daniel Edwards and his wife have been working to find the patient place at [employer].  Unknown to this provider we are in the process they are.  I have placed a consult for transitions of care team to evaluate case.  Once appropriate disposition is found patient is medically stable for discharge.  DVT prophylaxis: SQH Code Status: FULL  Family Communication: none Disposition Plan: SNF vs home health   Consultants:   none  Procedures: none   Antimicrobials:  none   Subjective: Seen and examined No acute events Feels ok, just weak  Objective: Vitals:   01/27/19 0000 01/27/19 0359 01/27/19 0600  01/27/19 0754  BP:  131/80 125/80 (!) 145/69  Pulse: 61 77 88 70  Resp:  20  19  Temp:  98.3 F (36.8 C)  98 F (36.7 C)  TempSrc:  Oral  Oral  SpO2:  92%  92%  Weight:      Height:        Intake/Output Summary (Last 24 hours) at 01/27/2019 1354 Last data filed at 01/27/2019 0900 Gross per 24 hour  Intake 600 ml  Output 1200 ml  Net -600 ml   Filed Weights    01/24/19 1930 01/26/19 0406  Weight: 65.8 kg 57.4 kg    Examination:  General exam: Appears calm and comfortable.  Appears dry Respiratory system: Clear to auscultation. Respiratory effort normal. Cardiovascular system: S1 & S2 heard, RRR. No JVD, murmurs, rubs, gallops or clicks. Gastrointestinal system: Abdomen is nondistended, soft and nontender. Thin Central nervous system: Alert and oriented. No focal neurological deficits. Extremities: Decreased strength Skin: Dry and pale Psychiatry: Judgement and insight appear normal. Mood & affect appropriate.     Data Reviewed: I have personally reviewed following labs and imaging studies  CBC: Recent Labs  Lab 01/21/19 2119 01/24/19 1935 01/25/19 0733  WBC 10.1 14.4* 11.7*  HGB 13.6 13.3 13.0  HCT 38.5* 38.6* 37.5*  MCV 82.6 85.0 84.1  PLT 259 279 0000000   Basic Metabolic Panel: Recent Labs  Lab 01/21/19 2119 01/24/19 1935 01/25/19 0733 01/25/19 1808 01/26/19 0615 01/27/19 0627  NA 130* 133* 137  --  140 140  K 3.3* 2.7* 2.9* 4.5 5.6* 4.2  CL 90* 91* 98  --  111 111  CO2 25 27 26   --  23 23  GLUCOSE 210* 128* 68*  --  64* 75  BUN 8 35* 34*  --  33* 28*  CREATININE 0.76 1.80* 1.25*  --  0.90 0.84  CALCIUM 7.4* 7.0* 6.4*  --  6.7* 6.4*  MG  --   --  0.6* 1.8  --   --   PHOS  --   --  3.5  --   --   --    GFR: Estimated Creatinine Clearance: 55 mL/min (by C-G formula based on SCr of 0.84 mg/dL). Liver Function Tests: Recent Labs  Lab 01/25/19 0733  AST 46*  ALT 23  ALKPHOS 51  BILITOT 0.9  PROT 6.3*  ALBUMIN 3.5   No results for input(s): LIPASE, AMYLASE in the last 168 hours. No results for input(s): AMMONIA in the last 168 hours. Coagulation Profile: No results for input(s): INR, PROTIME in the last 168 hours. Cardiac Enzymes: Recent Labs  Lab 01/24/19 1935  CKTOTAL 725*   BNP (last 3 results) No results for input(s): PROBNP in the last 8760 hours. HbA1C: No results for input(s): HGBA1C in the last  72 hours. CBG: No results for input(s): GLUCAP in the last 168 hours. Lipid Profile: No results for input(s): CHOL, HDL, LDLCALC, TRIG, CHOLHDL, LDLDIRECT in the last 72 hours. Thyroid Function Tests: No results for input(s): TSH, T4TOTAL, FREET4, T3FREE, THYROIDAB in the last 72 hours. Anemia Panel: No results for input(s): VITAMINB12, FOLATE, FERRITIN, TIBC, IRON, RETICCTPCT in the last 72 hours. Sepsis Labs: No results for input(s): PROCALCITON, LATICACIDVEN in the last 168 hours.  Recent Results (from the past 240 hour(s))  SARS CORONAVIRUS 2 (TAT 6-24 HRS) Nasopharyngeal Nasopharyngeal Swab     Status: None   Collection Time: 01/24/19 10:14 PM   Specimen: Nasopharyngeal Swab  Result Value Ref Range Status  SARS Coronavirus 2 NEGATIVE NEGATIVE Final    Comment: (NOTE) SARS-CoV-2 target nucleic acids are NOT DETECTED. The SARS-CoV-2 RNA is generally detectable in upper and lower respiratory specimens during the acute phase of infection. Negative results do not preclude SARS-CoV-2 infection, do not rule out co-infections with other pathogens, and should not be used as the sole basis for treatment or other patient management decisions. Negative results must be combined with clinical observations, patient history, and epidemiological information. The expected result is Negative. Fact Sheet for Patients: SugarRoll.be Fact Sheet for Healthcare Providers: https://www.woods-mathews.com/ This test is not yet approved or cleared by the [place] FDA and  has been authorized for detection and/or diagnosis of SARS-CoV-2 by FDA under an Emergency Use Authorization (EUA). This EUA will remain  in effect (meaning this test can be used) for the duration of the COVID-19 declaration under Section 56 4(b)(1) of the Act, 21 U.S.C. section 360bbb-3(b)(1), unless the authorization is terminated or revoked sooner. Performed at [facility],  [place] [address], [place], [place] [address]          Radiology Studies: No results found.      Scheduled Meds: . aspirin EC  325 mg Oral Daily  . feeding supplement (ENSURE ENLIVE)  237 mL Oral TID BM  . folic acid  1 mg Oral Daily  . heparin  5,000 Units Subcutaneous Q8H  . LORazepam  0-4 mg Intravenous Q12H  . metoprolol tartrate  25 mg Oral BID  . multivitamin with minerals  1 tablet Oral Daily  . pantoprazole  40 mg Oral Daily  . tamsulosin  0.4 mg Oral Daily  . thiamine  100 mg Oral Daily   Or  . thiamine  100 mg Intravenous Daily   Continuous Infusions:    LOS: 3 days    Time spent: 35 minutes    Sidney Ace, MD [facility] Pager [phone]  If 7PM-7AM, please contact night-coverage [email or web address] Password [email or web address] 01/27/2019, 1:54 PM

## 2019-01-28 LAB — BASIC METABOLIC PANEL
Anion gap: 5 (ref 5–15)
BUN: 21 mg/dL (ref 8–23)
CO2: 30 mmol/L (ref 22–32)
Calcium: 7 mg/dL — ABNORMAL LOW (ref 8.9–10.3)
Chloride: 104 mmol/L (ref 98–111)
Creatinine, Ser: 0.76 mg/dL (ref 0.61–1.24)
GFR calc Af Amer: >60 mL/min (ref >60)
GFR calc non Af Amer: >60 mL/min (ref >60)
Glucose, Bld: 96 mg/dL (ref 70–99)
Potassium: 3.8 mmol/L (ref 3.5–5.1)
Sodium: 139 mmol/L (ref 135–145)

## 2019-01-28 MED ORDER — CALCIUM CARBONATE ANTACID 500 MG PO CHEW
1000.0000 mg | CHEWABLE_TABLET | Freq: Three times a day (TID) | ORAL | Status: DC
  Administered 2019-01-28 – 2019-01-29 (×2): 1000 mg via ORAL
  Filled 2019-01-28 (×2): qty 5

## 2019-01-28 MED ORDER — CALCIUM CARBONATE 1250 (500 CA) MG PO TABS: 1000.0000 mg | ORAL_TABLET | Freq: Three times a day (TID) | ORAL | Status: DC

## 2019-01-28 MED ORDER — CALCIUM CARBONATE 1250 (500 CA) MG PO TABS
1000.0000 mg | ORAL_TABLET | Freq: Three times a day (TID) | ORAL | Status: DC
  Administered 2019-01-28: 1000 mg via ORAL
  Filled 2019-01-28 (×3): qty 2

## 2019-01-28 NOTE — Progress Notes (Signed)
Pharmacy Electrolyte Monitoring Consult:  Pharmacy consulted to assist in monitoring and replacing electrolytes in this 84 y.o. male admitted on 01/24/2019 with Weakness  Labs:  Sodium (mmol/L)  Date Value  01/28/2019 139  02/23/2013 130 (L)   Potassium (mmol/L)  Date Value  01/28/2019 3.8  02/23/2013 3.6   Magnesium (mg/dL)  Date Value  01/25/2019 1.8  01/28/2013 2.0   Phosphorus (mg/dL)  Date Value  01/25/2019 3.5  12/14/2012 3.2   Calcium (mg/dL)  Date Value  01/28/2019 7.0 (L)   Calcium, Total (mg/dL)  Date Value  02/23/2013 8.4 (L)   Albumin (g/dL)  Date Value  01/25/2019 3.5  01/26/2013 2.5 (L)   Corrected Ca 7.4   Assessment/Plan: Calcium is still low. Will give 3000 mg of elemental calcium.   Will recheck electrolytes with am labs.  Oswald Hillock, PharmD, BCPS 01/28/2019 7:37 AM

## 2019-01-28 NOTE — Care Management Important Message (Signed)
Important Message  Patient Details  Name: Daniel Edwards MRN: [id number] Date of Birth: 1934/08/30   Medicare Important Message Given:  Yes     [name] 01/28/2019, 11:45 AM

## 2019-01-28 NOTE — NC FL2 (Signed)
[place] LEVEL OF CARE SCREENING TOOL     IDENTIFICATION  Patient Name: Daniel Edwards Birthdate: 08-06-1934 Sex: male Admission Date (Current Location): 01/24/2019  [place] and [place] Number:  Engineering geologist and Address:  [facility], [address], [place], [place] [address]      Provider Number: [id number]  Attending Physician Name and Address:  Sidney Ace, MD  Relative Name and Phone Number:  Darral Khosla  [phone]    Current Level of Care: Hospital Recommended Level of Care: [place] Prior Approval Number:    Date Approved/Denied:   PASRR Number: [id number]  Discharge Plan: SNF    Current Diagnoses: Patient Active Problem List   Diagnosis Date Noted  . Protein-calorie malnutrition, severe 01/27/2019  . Hypokalemia 01/24/2019  . Alcoholism ([place]) 01/24/2019  . ARF (acute renal failure) ([place]) 01/24/2019  . Weakness generalized 01/24/2019  . FTT (failure to thrive) in adult 01/24/2019  . Rhabdomyolysis 11/15/2018  . A-fib ([place]) 07/20/2015  . Chest pain 02/19/2015  . Atypical chest pain 02/19/2015    Orientation RESPIRATION BLADDER Height & Weight     Self, Situation, Place  Normal Continent Weight: 57.4 kg Height:  5\' 10"  (177.8 cm)  BEHAVIORAL SYMPTOMS/MOOD NEUROLOGICAL BOWEL NUTRITION STATUS      Continent Diet  AMBULATORY STATUS COMMUNICATION OF NEEDS Skin   Extensive Assist Verbally Normal                       Personal Care Assistance Level of Assistance  Bathing, Dressing Bathing Assistance: Maximum assistance   Dressing Assistance: Maximum assistance     Functional Limitations Info  Sight Sight Info: Adequate        SPECIAL CARE FACTORS FREQUENCY  PT (By licensed PT)     PT Frequency: 5x a week              Contractures Contractures Info: Not present    Additional Factors Info  Code Status Code Status Info: Full             Current  Medications (01/28/2019):  This is the current hospital active medication list Current Facility-Administered Medications  Medication Dose Route Frequency Provider Last Rate Last Admin  . acetaminophen (TYLENOL) tablet 650 mg  650 mg Oral Q6H PRN Elwyn Reach, MD   650 mg at 01/26/19 1819   Or  . acetaminophen (TYLENOL) suppository 650 mg  650 mg Rectal Q6H PRN Elwyn Reach, MD      . aspirin EC tablet 325 mg  325 mg Oral Daily Elwyn Reach, MD   325 mg at 01/28/19 0846  . calcium carbonate (OS-CAL - dosed in mg of elemental calcium) tablet 1,000 mg of elemental calcium  1,000 mg of elemental calcium Oral TID WC Oswald Hillock, RPH   1,000 mg of elemental calcium at 01/28/19 1129  . feeding supplement (ENSURE ENLIVE) (ENSURE ENLIVE) liquid 237 mL  237 mL Oral TID BM Sreenath, Sudheer B, MD   237 mL at 01/28/19 1130  . folic acid (FOLVITE) tablet 1 mg  1 mg Oral Daily Gala Romney L, MD   1 mg at 01/28/19 0847  . heparin injection 5,000 Units  5,000 Units Subcutaneous Q8H Elwyn Reach, MD   5,000 Units at 01/28/19 1414  . loperamide (IMODIUM) capsule 2 mg  2 mg Oral PRN Ralene Muskrat B, MD   2 mg at 01/27/19 1246  .  LORazepam (ATIVAN) injection 0-4 mg  0-4 mg Intravenous Q12H Gala Romney L, MD      . metoprolol tartrate (LOPRESSOR) tablet 25 mg  25 mg Oral BID Elwyn Reach, MD   25 mg at 01/28/19 0847  . multivitamin with minerals tablet 1 tablet  1 tablet Oral Daily Elwyn Reach, MD   1 tablet at 01/28/19 0847  . ondansetron (ZOFRAN) tablet 4 mg  4 mg Oral Q6H PRN Elwyn Reach, MD       Or  . ondansetron (ZOFRAN) injection 4 mg  4 mg Intravenous Q6H PRN Gala Romney L, MD      . pantoprazole (PROTONIX) EC tablet 40 mg  40 mg Oral Daily Elwyn Reach, MD   40 mg at 01/28/19 0847  . sodium chloride flush (NS) 0.9 % injection 10 mL  10 mL Intravenous Q12H Ralene Muskrat B, MD   10 mL at 01/28/19 0850  . tamsulosin (FLOMAX) capsule 0.4 mg  0.4  mg Oral Daily Gala Romney L, MD   0.4 mg at 01/28/19 0846  . thiamine tablet 100 mg  100 mg Oral Daily Gala Romney L, MD   100 mg at 01/28/19 [id number]   Or  . thiamine (B-1) injection 100 mg  100 mg Intravenous Daily Elwyn Reach, MD      . traZODone (DESYREL) tablet 50 mg  50 mg Oral QHS PRN Sharion Settler, NP   50 mg at 01/26/19 2100     Discharge Medications: Please see discharge summary for a list of discharge medications.  Relevant Imaging Results:  Relevant Lab Results:   Additional Information ssn: [id number]  Victorino Dike, RN

## 2019-01-28 NOTE — Plan of Care (Signed)
  Problem: Clinical Measurements: Goal: Respiratory complications will improve Outcome: Progressing   Problem: Education: Goal: Knowledge of General Education information will improve Description: Including pain rating scale, medication(s)/side effects and non-pharmacologic comfort measures Outcome: Progressing   Problem: Activity: Goal: Risk for activity intolerance will decrease Outcome: Progressing

## 2019-01-28 NOTE — Progress Notes (Signed)
PROGRESS NOTE    Daniel Edwards  [id number] DOB: 15-Jul-1934 DOA: 01/24/2019 PCP: Baxter Hire, MD   Brief Narrative:  Daniel Edwards is a 84 y.o. male with medical history significant of alcoholism, hypertension, atrial fibrillation GERD who was brought in by family due to failing to thrive at home.  He has progressively gotten weaker.  Patient has not been eating or drinking just taking alcohol daily.  Patient has generalized debility to the point that he is not able to move around on his own.  Family believes he is unable to take care of himself at home.  He lives alone.  In the ER patient was found to be weak dehydrated acute kidney injury and hypokalemic.  He is being admitted to the hospital for treatment of generalized weakness as well as failure to thrive..  12/18: Patient resting in bed.  He reports improvement in his weakness.  No other acute changes.  12/19: No changes.  Discussed physical therapy recommendation skilled nursing facility.  Patient adamantly refusing.  12/20: No acute status changes.  Patient continues to be reluctant about skilled nursing facility placement.  Spoke at length with son via phone.  Brad at [phone].  Patient's son and his wife explained that patient's current living situation is unsafe to return to.    12/21: No status changes.  Patient in good spirits this morning.  Pending skilled nursing facility placement.    Assessment & Plan:   Principal Problem:   Weakness generalized Active Problems:   A-fib (HCC)   Rhabdomyolysis   Hypokalemia   Alcoholism ([place])   ARF (acute renal failure) (HCC)   FTT (failure to thrive) in adult   Protein-calorie malnutrition, severe  #1 generalized weakness Multifactorial likely owing to impaired dietary intake as well as alcohol intake DC IVF, creatinine improved and patient tolerating PO Replete electrolytes as necessary Therapy and Occupational Therapy consults: rec SNF Nutrition consult: Severe  malnutrition noted Social services consult  #2 acute kidney injury: resolved Most likely prerenal  DC IVF  #3 hypokalemia Replete potassium  #4 alcohol abuse:  Initiate CIWA protocol. Per patient's son, the patient drinks approximately 6-8 beers a day and has been doing so for quite a while.  Only recently has statin drinking a little less because he is unable to go to the store to get the beer.  #5 mild rhabdomyolysis:  Most likely secondary to alcoholism with possible fall.   Hydrate and monitor CPK  #6 A. fib: Patient is in sinus rhythm.  #7 Severe protein calorie malnutrition Likely owing to chronic alcohol abuse Social services consult Nutrition consult Patient will benefit from skilled nursing facility versus long-term care placement  Spoke at length with the patient's son Daniel Edwards via phone [phone]).  Daniel Edwards and his wife explained that the patient is not capable of caring for himself at home.  His house is apparently in disarray and represents an unsafe living condition and he cannot return there.  Daniel Edwards and his wife have been working to find the patient place at [facility].  Unknown to this provider we are in the process they are.  I have placed a consult for transitions of care team to evaluate case.  Once appropriate disposition is found patient is medically stable for discharge.  Pending skilled nursing facility placement.  Transitions of care team aware and actively seeking placement.  DVT prophylaxis: SQH Code Status: FULL  Family Communication: none Disposition Plan: SNF vs home health   Consultants:  none  Procedures: none   Antimicrobials:  none   Subjective: Seen and examined No acute events Energy seems improved this morning  Objective: Vitals:   01/27/19 2032 01/28/19 0315 01/28/19 0814 01/28/19 1130  BP: (!) 153/78 (!) 147/87 135/84   Pulse: 72 84 74   Resp: 20 18 17    Temp: 98 F (36.7 C) 97.9 F (36.6 C) 98.6 F (37 C)    TempSrc: Oral Oral Oral   SpO2: 99% 95% 94% 93%  Weight:      Height:        Intake/Output Summary (Last 24 hours) at 01/28/2019 1358 Last data filed at 01/28/2019 0408 Gross per 24 hour  Intake 360 ml  Output 700 ml  Net -340 ml   Filed Weights   01/24/19 1930 01/26/19 0406  Weight: 65.8 kg 57.4 kg    Examination:  General exam: Appears calm and comfortable.  Appears dry Respiratory system: Clear to auscultation. Respiratory effort normal. Cardiovascular system: S1 & S2 heard, RRR. No JVD, murmurs, rubs, gallops or clicks.  Gastrointestinal system: Abdomen is nondistended, soft and nontender. Thin Central nervous system: Alert and oriented. No focal neurological deficits. Extremities: Decreased strength Skin: Dry and pale Psychiatry: Judgement and insight appear normal. Mood & affect appropriate.     Data Reviewed: I have personally reviewed following labs and imaging studies  CBC: Recent Labs  Lab 01/21/19 2119 01/24/19 1935 01/25/19 0733  WBC 10.1 14.4* 11.7*  HGB 13.6 13.3 13.0  HCT 38.5* 38.6* 37.5*  MCV 82.6 85.0 84.1  PLT 259 279 0000000   Basic Metabolic Panel: Recent Labs  Lab 01/24/19 1935 01/25/19 0733 01/25/19 1808 01/26/19 0615 01/27/19 0627 01/28/19 0633  NA 133* 137  --  140 140 139  K 2.7* 2.9* 4.5 5.6* 4.2 3.8  CL 91* 98  --  111 111 104  CO2 27 26  --  23 23 30   GLUCOSE 128* 68*  --  64* 75 96  BUN 35* 34*  --  33* 28* 21  CREATININE 1.80* 1.25*  --  0.90 0.84 0.76  CALCIUM 7.0* 6.4*  --  6.7* 6.4* 7.0*  MG  --  0.6* 1.8  --   --   --   PHOS  --  3.5  --   --   --   --    GFR: Estimated Creatinine Clearance: 57.8 mL/min (by C-G formula based on SCr of 0.76 mg/dL). Liver Function Tests: Recent Labs  Lab 01/25/19 0733  AST 46*  ALT 23  ALKPHOS 51  BILITOT 0.9  PROT 6.3*  ALBUMIN 3.5   No results for input(s): LIPASE, AMYLASE in the last 168 hours. No results for input(s): AMMONIA in the last 168 hours. Coagulation  Profile: No results for input(s): INR, PROTIME in the last 168 hours. Cardiac Enzymes: Recent Labs  Lab 01/24/19 1935  CKTOTAL 725*   BNP (last 3 results) No results for input(s): PROBNP in the last 8760 hours. HbA1C: No results for input(s): HGBA1C in the last 72 hours. CBG: No results for input(s): GLUCAP in the last 168 hours. Lipid Profile: No results for input(s): CHOL, HDL, LDLCALC, TRIG, CHOLHDL, LDLDIRECT in the last 72 hours. Thyroid Function Tests: No results for input(s): TSH, T4TOTAL, FREET4, T3FREE, THYROIDAB in the last 72 hours. Anemia Panel: No results for input(s): VITAMINB12, FOLATE, FERRITIN, TIBC, IRON, RETICCTPCT in the last 72 hours. Sepsis Labs: No results for input(s): PROCALCITON, LATICACIDVEN in the last 168 hours.  Recent Results (  from the past 240 hour(s))  SARS CORONAVIRUS 2 (TAT 6-24 HRS) Nasopharyngeal Nasopharyngeal Swab     Status: None   Collection Time: 01/24/19 10:14 PM   Specimen: Nasopharyngeal Swab  Result Value Ref Range Status   SARS Coronavirus 2 NEGATIVE NEGATIVE Final    Comment: (NOTE) SARS-CoV-2 target nucleic acids are NOT DETECTED. The SARS-CoV-2 RNA is generally detectable in upper and lower respiratory specimens during the acute phase of infection. Negative results do not preclude SARS-CoV-2 infection, do not rule out co-infections with other pathogens, and should not be used as the sole basis for treatment or other patient management decisions. Negative results must be combined with clinical observations, patient history, and epidemiological information. The expected result is Negative. Fact Sheet for Patients: SugarRoll.be Fact Sheet for Healthcare Providers: https://www.woods-mathews.com/ This test is not yet approved or cleared by the [place] FDA and  has been authorized for detection and/or diagnosis of SARS-CoV-2 by FDA under an Emergency Use Authorization (EUA). This EUA  will remain  in effect (meaning this test can be used) for the duration of the COVID-19 declaration under Section 56 4(b)(1) of the Act, 21 U.S.C. section 360bbb-3(b)(1), unless the authorization is terminated or revoked sooner. Performed at [facility], [place] [address], [place], [place] [address]          Radiology Studies: No results found.      Scheduled Meds: . aspirin EC  325 mg Oral Daily  . calcium carbonate  1,000 mg of elemental calcium Oral TID WC  . feeding supplement (ENSURE ENLIVE)  237 mL Oral TID BM  . folic acid  1 mg Oral Daily  . heparin  5,000 Units Subcutaneous Q8H  . LORazepam  0-4 mg Intravenous Q12H  . metoprolol tartrate  25 mg Oral BID  . multivitamin with minerals  1 tablet Oral Daily  . pantoprazole  40 mg Oral Daily  . sodium chloride flush  10 mL Intravenous Q12H  . tamsulosin  0.4 mg Oral Daily  . thiamine  100 mg Oral Daily   Or  . thiamine  100 mg Intravenous Daily   Continuous Infusions:    LOS: 4 days    Time spent: 35 minutes    Sidney Ace, MD [facility] Pager [phone]  If 7PM-7AM, please contact night-coverage [email or web address] Password [email or web address] 01/28/2019, 1:58 PM

## 2019-01-28 NOTE — TOC Initial Note (Signed)
Transition of Care [facility]) - Initial/Assessment Note    Patient Details  Name: Daniel Edwards MRN: [id number] Date of Birth: 1935/01/10  [facility]) CM/SW Contact:    Victorino Dike, RN Phone Number: 01/28/2019, 2:27 PM  Clinical Narrative:           Met with patient to discuss discharge planning.  Patient stated his son [name] and his wife Jackelyn Poling are helping him figure out the next plan.  He understands his family is concerned for his well being.  Patient reports they are talking about Assisted Living in his future.  Called son to discuss discharge planning.  He reports they are looking at assistive living, but that he is in agreement with MD and PT, that patient can benefit from short term stay rehab.     FL2 and PASSR completed.  Bed Search started.  Family and patient was happy with [facility] previously and if bed offered would like return their.    Expected Discharge Plan: [place] Barriers to Discharge: SNF Pending bed offer   Patient Goals and CMS Choice        Expected Discharge Plan and Services Expected Discharge Plan: [place]   Discharge Planning Services: CM Consult                                          Prior Living Arrangements/Services     Patient language and need for interpreter reviewed:: Yes        Need for Family Participation in Patient Care: Yes (Comment) Care giver support system in place?: Yes (comment) Current home services: DME Criminal Activity/Legal Involvement Pertinent to Current Situation/Hospitalization: No - Comment as needed  Activities of Daily Living Home Assistive Devices/Equipment: Cane (specify quad or straight), Walker (specify type) ADL Screening (condition at time of admission) Patient's cognitive ability adequate to safely complete daily activities?: Yes Is the patient deaf or have difficulty hearing?: No Does the patient have difficulty seeing, even when  wearing glasses/contacts?: No Does the patient have difficulty concentrating, remembering, or making decisions?: No Patient able to express need for assistance with ADLs?: Yes Does the patient have difficulty dressing or bathing?: Yes Independently performs ADLs?: Yes (appropriate for developmental age) Does the patient have difficulty walking or climbing stairs?: Yes Weakness of Legs: Both Weakness of Arms/Hands: None  Permission Sought/Granted                  Emotional Assessment Appearance:: Appears stated age Attitude/Demeanor/Rapport: Engaged Affect (typically observed): Appropriate Orientation: : Oriented to Self, Oriented to Place, Oriented to Situation Alcohol / Substance Use: Not Applicable Psych Involvement: No (comment)  Admission diagnosis:  Dehydration [E86.0] Hypokalemia [E87.6] Weakness generalized [R53.1] Generalized weakness [R53.1] AKI (acute kidney injury) ([place]) [N17.9] Patient Active Problem List   Diagnosis Date Noted  . Protein-calorie malnutrition, severe 01/27/2019  . Hypokalemia 01/24/2019  . Alcoholism ([place]) 01/24/2019  . ARF (acute renal failure) ([place]) 01/24/2019  . Weakness generalized 01/24/2019  . FTT (failure to thrive) in adult 01/24/2019  . Rhabdomyolysis 11/15/2018  . A-fib ([place]) 07/20/2015  . Chest pain 02/19/2015  . Atypical chest pain 02/19/2015   PCP:  Baxter Hire, MD Pharmacy:   [facility], [place] [address] Phone: [phone] Fax: [phone]  [id number] [place],  [place] - [address] [place] [address] Phone: [phone] Fax: [phone]     Social Determinants of Health (SDOH) Interventions    Readmission Risk Interventions Readmission Risk Prevention Plan 11/18/2018  Transportation Screening Complete  Some recent data might be hidden

## 2019-01-29 LAB — MAGNESIUM
Magnesium: 0.6 mg/dL — CL (ref 1.7–2.4)
Magnesium: 0.7 mg/dL — CL (ref 1.7–2.4)

## 2019-01-29 LAB — BASIC METABOLIC PANEL
Anion gap: 12 (ref 5–15)
BUN: 17 mg/dL (ref 8–23)
CO2: 27 mmol/L (ref 22–32)
Calcium: 7.5 mg/dL — ABNORMAL LOW (ref 8.9–10.3)
Chloride: 97 mmol/L — ABNORMAL LOW (ref 98–111)
Creatinine, Ser: 0.66 mg/dL (ref 0.61–1.24)
GFR calc Af Amer: >60 mL/min (ref >60)
GFR calc non Af Amer: >60 mL/min (ref >60)
Glucose, Bld: 92 mg/dL (ref 70–99)
Potassium: 3.3 mmol/L — ABNORMAL LOW (ref 3.5–5.1)
Sodium: 136 mmol/L (ref 135–145)

## 2019-01-29 MED ORDER — CALCIUM CARBONATE 1250 (500 CA) MG PO TABS
1000.0000 mg | ORAL_TABLET | Freq: Three times a day (TID) | ORAL | Status: AC
  Administered 2019-01-29: 1000 mg via ORAL
  Filled 2019-01-29: qty 2

## 2019-01-29 MED ORDER — POTASSIUM CHLORIDE 20 MEQ PO PACK: 40.0000 meq | PACK | ORAL | Status: DC

## 2019-01-29 MED ORDER — SODIUM CHLORIDE 0.9 % IV SOLN: INTRAVENOUS | Status: DC | PRN

## 2019-01-29 MED ORDER — MAGNESIUM SULFATE 4 GM/100ML IV SOLN
4.0000 g | Freq: Once | INTRAVENOUS | Status: AC
  Administered 2019-01-29: 4 g via INTRAVENOUS
  Filled 2019-01-29: qty 100

## 2019-01-29 MED ORDER — POTASSIUM CHLORIDE 20 MEQ PO PACK
40.0000 meq | PACK | Freq: Once | ORAL | Status: AC
  Administered 2019-01-29: 40 meq via ORAL
  Filled 2019-01-29: qty 2

## 2019-01-29 MED ORDER — MAGNESIUM SULFATE 2 GM/50ML IV SOLN
2.0000 g | Freq: Once | INTRAVENOUS | Status: AC
  Administered 2019-01-29: 2 g via INTRAVENOUS
  Filled 2019-01-29: qty 50

## 2019-01-29 NOTE — TOC Progression Note (Signed)
Transition of Care [facility]) - Progression Note    Patient Details  Name: Daniel Edwards MRN: [id number] Date of Birth: 16-Oct-1934  Transition of Care [facility]) CM/SW [place], RN Phone Number: 01/29/2019, 10:04 AM  Clinical Narrative:      Reviewed bed search, no bed offers at this time.  [facility] is not accepting patients for admission at this time.  [facility] and [facility] to discuss reviewing patient.  They all report they will review his information and return my call.  All other local facilities have declined at this time.    Expected Discharge Plan: [place] Barriers to Discharge: SNF Pending bed offer  Expected Discharge Plan and Services Expected Discharge Plan: [place]   Discharge Planning Services: CM Consult                                           Social Determinants of Health (SDOH) Interventions    Readmission Risk Interventions Readmission Risk Prevention Plan 11/18/2018  Transportation Screening Complete  Some recent data might be hidden

## 2019-01-29 NOTE — Progress Notes (Addendum)
Pharmacy Electrolyte Monitoring Consult:  Pharmacy consulted to assist in monitoring and replacing electrolytes in this 84 y.o. male admitted on 01/24/2019 with Weakness  Labs:  Sodium (mmol/L)  Date Value  01/29/2019 136  02/23/2013 130 (L)   Potassium (mmol/L)  Date Value  01/29/2019 3.3 (L)  02/23/2013 3.6   Magnesium (mg/dL)  Date Value  01/29/2019 0.6 (LL)  01/28/2013 2.0   Phosphorus (mg/dL)  Date Value  01/25/2019 3.5  12/14/2012 3.2   Calcium (mg/dL)  Date Value  01/29/2019 7.5 (L)   Calcium, Total (mg/dL)  Date Value  02/23/2013 8.4 (L)   Albumin (g/dL)  Date Value  01/25/2019 3.5  01/26/2013 2.5 (L)   Corrected Ca 7.9   Assessment/Plan: Calcium is still low. Will give 3000 mg of elemental calcium total today.   AM lab: Mg is 0.6 - MD ordered Mg 4g x 1 Afternoon lab: Mg 0.7 - MD ordered Mg 2 g x 1.  K+ 3.3 - MD ordered KCl 40 mEq x 1 PO.   Will recheck electrolytes with am labs.  Oswald Hillock, PharmD, BCPS 01/29/2019 9:56 AM

## 2019-01-29 NOTE — Progress Notes (Signed)
CRITICAL VALUE ALERT  Critical Value:  0.6  Date & Time Notied:  12/22/202 0630  Provider Notified: Sharion Settler  Orders Received/Actions taken: Given IV mag

## 2019-01-29 NOTE — Progress Notes (Signed)
PROGRESS NOTE    Daniel Edwards  [id number] DOB: [birth year] DOA: 01/24/2019 PCP: Baxter Hire, MD   Brief Narrative:  Daniel Edwards is a 84 y.o. male with medical history significant of alcoholism, hypertension, atrial fibrillation GERD who was brought in by family due to failing to thrive at home.  He has progressively gotten weaker.  Patient has not been eating or drinking just taking alcohol daily.  Patient has generalized debility to the point that he is not able to move around on his own.  Family believes he is unable to take care of himself at home.  He lives alone.  In the ER patient was found to be weak dehydrated acute kidney injury and hypokalemic.  He is being admitted to the hospital for treatment of generalized weakness as well as failure to thrive..  12/18: Patient resting in bed.  He reports improvement in his weakness.  No other acute changes.  12/19: No changes.  Discussed physical therapy recommendation skilled nursing facility.  Patient adamantly refusing.  12/20: No acute status changes.  Patient continues to be reluctant about skilled nursing facility placement.  Spoke at length with son via phone.  Daniel Edwards at [phone].  Patient's son and his wife explained that patient's current living situation is unsafe to return to.    12/21: No status changes.  Patient in good spirits this morning.  Pending skilled nursing facility placement.  12/22: No acute status changes.  No new complaints.  Facility the patient was initially planning to dispo to has refused due to outbreak of Covid cases.  Currently pending facility placement.     Assessment & Plan:   Principal Problem:   Weakness generalized Active Problems:   A-fib (HCC)   Rhabdomyolysis   Hypokalemia   Alcoholism ([place])   ARF (acute renal failure) (HCC)   FTT (failure to thrive) in adult   Protein-calorie malnutrition, severe  #1 generalized weakness Multifactorial likely owing to impaired dietary intake  as well as alcohol intake DC IVF, creatinine improved and patient tolerating PO Replete electrolytes as necessary Therapy and Occupational Therapy consults: rec SNF Nutrition consult: Severe malnutrition noted Social services consult Patient and family wish to go to [facility] however they are not excepting patients for admission at this time.  Transition of care team is currently working to find alternative placement  #2 acute kidney injury: resolved Most likely prerenal  DC IVF  #3 hypokalemia Replete potassium  #4 alcohol abuse:  Initiate CIWA protocol. Per patient's son, the patient drinks approximately 6-8 beers a day and has been doing so for quite a while.  Only recently has statin drinking a little less because he is unable to go to the store to get the beer.  #5 mild rhabdomyolysis:  Most likely secondary to alcoholism with possible fall.   Hydrate and monitor CPK  #6 A. fib: Patient is in sinus rhythm.  #7 Severe protein calorie malnutrition Likely owing to chronic alcohol abuse Social services consult Nutrition consult Patient will benefit from skilled nursing facility versus long-term care placement  Spoke at length with the patient's son Daniel Edwards via phone [phone]).  Daniel Edwards and his wife explained that the patient is not capable of caring for himself at home.  His house is apparently in disarray and represents an unsafe living condition and he cannot return there.  Daniel Edwards and his wife have been working to find the patient place at [facility].  Unknown to this provider we are in the  process they are.  I have placed a consult for transitions of care team to evaluate case.  Once appropriate disposition is found patient is medically stable for discharge.  Pending skilled nursing facility placement.  Transitions of care team aware and actively seeking placement.  DVT prophylaxis: SQH Code Status: FULL  Family Communication: none Disposition Plan: SNF vs home  health   Consultants:   none  Procedures: none   Antimicrobials:  none   Subjective: Seen and examined No acute events No new complaints Expresses frustration about not being able to return home  Objective: Vitals:   01/29/19 0430 01/29/19 0449 01/29/19 0800 01/29/19 1542  BP: 127/71  97/78 109/61  Pulse: (!) 38 88 80 69  Resp:   17 18  Temp: 98 F (36.7 C)  98 F (36.7 C) 98 F (36.7 C)  TempSrc: Oral     SpO2: 90%  93% 94%  Weight: 55.7 kg     Height:        Intake/Output Summary (Last 24 hours) at 01/29/2019 1647 Last data filed at 01/29/2019 1532 Gross per 24 hour  Intake 0 ml  Output 800 ml  Net -800 ml   Filed Weights   01/24/19 1930 01/26/19 0406 01/29/19 0430  Weight: 65.8 kg 57.4 kg 55.7 kg    Examination:  General exam: Appears calm and comfortable.  Appears dry Respiratory system: Clear to auscultation. Respiratory effort normal. Cardiovascular system: S1 & S2 heard, RRR. No JVD, murmurs, rubs, gallops or clicks.  Gastrointestinal system: Abdomen is nondistended, soft and nontender. Thin Central nervous system: Alert and oriented. No focal neurological deficits. Extremities: Decreased strength Skin: Dry and pale Psychiatry: Judgement and insight appear normal. Mood & affect appropriate.     Data Reviewed: I have personally reviewed following labs and imaging studies  CBC: Recent Labs  Lab 01/24/19 1935 01/25/19 0733  WBC 14.4* 11.7*  HGB 13.3 13.0  HCT 38.6* 37.5*  MCV 85.0 84.1  PLT 279 0000000   Basic Metabolic Panel: Recent Labs  Lab 01/25/19 0733 01/25/19 1808 01/26/19 0615 01/27/19 0627 01/28/19 0633 01/29/19 0455  NA 137  --  140 140 139 136  K 2.9* 4.5 5.6* 4.2 3.8 3.3*  CL 98  --  111 111 104 97*  CO2 26  --  23 23 30 27   GLUCOSE 68*  --  64* 75 96 92  BUN 34*  --  33* 28* 21 17  CREATININE 1.25*  --  0.90 0.84 0.76 0.66  CALCIUM 6.4*  --  6.7* 6.4* 7.0* 7.5*  MG 0.6* 1.8  --   --   --  0.7*  0.6*  PHOS 3.5   --   --   --   --   --    GFR: Estimated Creatinine Clearance: 56.1 mL/min (by C-G formula based on SCr of 0.66 mg/dL). Liver Function Tests: Recent Labs  Lab 01/25/19 0733  AST 46*  ALT 23  ALKPHOS 51  BILITOT 0.9  PROT 6.3*  ALBUMIN 3.5   No results for input(s): LIPASE, AMYLASE in the last 168 hours. No results for input(s): AMMONIA in the last 168 hours. Coagulation Profile: No results for input(s): INR, PROTIME in the last 168 hours. Cardiac Enzymes: Recent Labs  Lab 01/24/19 1935  CKTOTAL 725*   BNP (last 3 results) No results for input(s): PROBNP in the last 8760 hours. HbA1C: No results for input(s): HGBA1C in the last 72 hours. CBG: No results for input(s): GLUCAP in  the last 168 hours. Lipid Profile: No results for input(s): CHOL, HDL, LDLCALC, TRIG, CHOLHDL, LDLDIRECT in the last 72 hours. Thyroid Function Tests: No results for input(s): TSH, T4TOTAL, FREET4, T3FREE, THYROIDAB in the last 72 hours. Anemia Panel: No results for input(s): VITAMINB12, FOLATE, FERRITIN, TIBC, IRON, RETICCTPCT in the last 72 hours. Sepsis Labs: No results for input(s): PROCALCITON, LATICACIDVEN in the last 168 hours.  Recent Results (from the past 240 hour(s))  SARS CORONAVIRUS 2 (TAT 6-24 HRS) Nasopharyngeal Nasopharyngeal Swab     Status: None   Collection Time: 01/24/19 10:14 PM   Specimen: Nasopharyngeal Swab  Result Value Ref Range Status   SARS Coronavirus 2 NEGATIVE NEGATIVE Final    Comment: (NOTE) SARS-CoV-2 target nucleic acids are NOT DETECTED. The SARS-CoV-2 RNA is generally detectable in upper and lower respiratory specimens during the acute phase of infection. Negative results do not preclude SARS-CoV-2 infection, do not rule out co-infections with other pathogens, and should not be used as the sole basis for treatment or other patient management decisions. Negative results must be combined with clinical observations, patient history, and epidemiological  information. The expected result is Negative. Fact Sheet for Patients: SugarRoll.be Fact Sheet for Healthcare Providers: https://www.woods-mathews.com/ This test is not yet approved or cleared by the [place] FDA and  has been authorized for detection and/or diagnosis of SARS-CoV-2 by FDA under an Emergency Use Authorization (EUA). This EUA will remain  in effect (meaning this test can be used) for the duration of the COVID-19 declaration under Section 56 4(b)(1) of the Act, 21 U.S.C. section 360bbb-3(b)(1), unless the authorization is terminated or revoked sooner. Performed at [facility], [place] [address], [place],  [address]          Radiology Studies: No results found.      Scheduled Meds: . aspirin EC  325 mg Oral Daily  . calcium carbonate  1,000 mg of elemental calcium Oral TID WC  . feeding supplement (ENSURE ENLIVE)  237 mL Oral TID BM  . folic acid  1 mg Oral Daily  . heparin  5,000 Units Subcutaneous Q8H  . metoprolol tartrate  25 mg Oral BID  . multivitamin with minerals  1 tablet Oral Daily  . pantoprazole  40 mg Oral Daily  . sodium chloride flush  10 mL Intravenous Q12H  . tamsulosin  0.4 mg Oral Daily  . thiamine  100 mg Oral Daily   Or  . thiamine  100 mg Intravenous Daily   Continuous Infusions: . sodium chloride       LOS: 5 days    Time spent: 35 minutes    Sidney Ace, MD [facility] Pager [phone]  If 7PM-7AM, please contact night-coverage [email or web address] Password [facility] 01/29/2019, 4:47 PM

## 2019-01-29 NOTE — Progress Notes (Signed)
Critical lab mag level 0.6 - 4 gm magnesium sulfate ordered with repeat level requested 1500 today Potassium replacement also ordered

## 2019-01-29 NOTE — Progress Notes (Addendum)
CRITICAL VALUE ALERT  Critical Value:  Mag 0.7  Date & Time Notied:  01/29/19 @ 1450  Provider Notified: Dr. Priscella Mann @ [phone]  Orders Received/Actions taken: please see orders

## 2019-01-29 NOTE — Plan of Care (Signed)

## 2019-01-29 NOTE — Progress Notes (Signed)
Physical Therapy Treatment Patient Details Name: Daniel Edwards MRN: [id number] DOB: 04-03-1934 Today's Date: 01/29/2019    History of Present Illness Per MD H&P: Pt is an 84 y.o. male with medical history significant of alcoholism, hypertension, atrial fibrillation GERD who was brought in by family due to failing to thrive at home.  He has progressively gotten weaker.  Patient has not been eating or drinking just taking alcohol daily.  Patient has generalized debility to the point that he is not able to move around on his own.  Family believes he is unable to take care of himself at home.  In the ER patient was found to be weak dehydrated acute kidney injury and hypokalemic.  He is being admitted to the hospital for treatment of generalized weakness as well as failure to thrive.  MD assessment includes: weakness, AKI, hypokalemia, alcohol abuse, mild rhabdomyolysis, and A-fib.    PT Comments    Pt in bed upon arrival, agreeable to participate in PT session. Pt tolerates training on basic mobility, RW use, and balance training, but is grossly limited by fatigue. Pt has no LOB with balance training, but is somewhat reluctant to follow cues for balance activities for fear of instability, which is not uncommon the first time a patient is going throughout these activities with minGuard assist. Pt requires ample rest breaks for recovery throughout, but denies dizziness or SOB. No tachycardia issues.   Follow Up Recommendations  SNF     Equipment Recommendations  None recommended by PT    Recommendations for Other Services       Precautions / Restrictions Precautions Precautions: Fall Restrictions Weight Bearing Restrictions: No    Mobility  Bed Mobility Overal bed mobility: Modified Independent       Supine to sit: Modified independent (Device/Increase time) Sit to supine: Modified independent (Device/Increase time)      Transfers Overall transfer level: Needs  assistance Equipment used: Rolling walker (2 wheeled) Transfers: Sit to/from Stand Sit to Stand: Min guard;Supervision         General transfer comment: attempted 5xSTS, but too fatigued, able to only do 3x. No LOB. Safe use of RW noted.  Ambulation/Gait   Gait Distance (Feet): 76 Feet Assistive device: Rolling walker (2 wheeled) Gait Pattern/deviations: Step-through pattern     General Gait Details: some instability with turns.   Stairs             Wheelchair Mobility    Modified Rankin (Stroke Patients Only)       Balance                                            Cognition Arousal/Alertness: Awake/alert Behavior During Therapy: WFL for tasks assessed/performed Overall Cognitive Status: Within Functional Limits for tasks assessed                                        Exercises Other Exercises Other Exercises: Normal stance unsupported 1x30sec, eyes closed 1x30sec Other Exercises: vertical head turns 1x10 unsupported Other Exercises: semitandem stance 1x30sec bilat    General Comments        Pertinent Vitals/Pain Pain Assessment: No/denies pain    Home Living  Prior Function            PT Goals (current goals can now be found in the care plan section) Acute Rehab PT Goals Patient Stated Goal: To walk better and get stronger PT Goal Formulation: With patient Time For Goal Achievement: 02/07/19 Potential to Achieve Goals: Good Progress towards PT goals: Progressing toward goals    Frequency    Min 2X/week      PT Plan Current plan remains appropriate    Co-evaluation              AM-PAC PT "6 Clicks" Mobility   Outcome Measure  Help needed turning from your back to your side while in a flat bed without using bedrails?: A Little Help needed moving from lying on your back to sitting on the side of a flat bed without using bedrails?: A Little Help needed moving  to and from a bed to a chair (including a wheelchair)?: A Little Help needed standing up from a chair using your arms (e.g., wheelchair or bedside chair)?: A Little Help needed to walk in hospital room?: A Little Help needed climbing 3-5 steps with a railing? : A Lot 6 Click Score: 17    End of Session Equipment Utilized During Treatment: Gait belt;Oxygen Activity Tolerance: Patient tolerated treatment well;No increased pain Patient left: in bed;with call bell/phone within reach;with bed alarm set Nurse Communication: Mobility status PT Visit Diagnosis: Unsteadiness on feet (R26.81);Muscle weakness (generalized) (M62.81);Difficulty in walking, not elsewhere classified (R26.2)     Time: [id number] PT Time Calculation (min) (ACUTE ONLY): 21 min  Charges:  $Therapeutic Exercise: 8-22 mins                     12:20 PM, 01/29/19 Etta Grandchild, PT, DPT Physical Therapist - [facility]  [phone] ([place])    Antwoin Lackey C 01/29/2019, 12:14 PM

## 2019-01-29 NOTE — TOC Progression Note (Signed)
Transition of Care [facility]) - Progression Note    Patient Details  Name: Daniel Edwards MRN: [id number] Date of Birth: 14-Sep-1934  [facility]) CM/SW [place], RN Phone Number: 01/29/2019, 11:38 AM  Clinical Narrative:     Spoke with son, bed offered and accepted at [facility] in [place].  Waiting on Insurance Authorization at this time.  [employer] has requested updated therapy notes.     Expected Discharge Plan: [place] Barriers to Discharge: SNF Pending bed offer  Expected Discharge Plan and Services Expected Discharge Plan: [place]   Discharge Planning Services: CM Consult                                           Social Determinants of Health (SDOH) Interventions    Readmission Risk Interventions Readmission Risk Prevention Plan 11/18/2018  Transportation Screening Complete  Some recent data might be hidden

## 2019-01-29 NOTE — Progress Notes (Signed)
Nutrition Follow Up Note   DOCUMENTATION CODES:   Severe malnutrition in context of chronic illness, Underweight  INTERVENTION:   Continue Ensure Enlive po TID, each supplement provides 350 kcal and 20 grams of protein. Patient prefers chocolate.  Continue MVI daily, thiamine 123XX123 mg daily, folic acid 1 mg daily.  Monitor magnesium, potassium, and phosphorus daily for at least 3 days, MD to replete as needed, as pt is at risk for refeeding syndrome given severe malnutrition and hx of EtOH abuse. Noted pharmacy has already been consulted for electrolyte management.  NUTRITION DIAGNOSIS:   Severe Malnutrition related to chronic illness(COPD, EtOH abuse) as evidenced by severe fat depletion, severe muscle depletion.  GOAL:   Patient will meet greater than or equal to 90% of their needs  -Progressing   MONITOR:   PO intake, Supplement acceptance, Labs, Weight trends, I & O's  ASSESSMENT:   84 year old male with PMHx of HTN, GERD, bronchitis, COPD, BPH, EtOH abuse admitted with generalized weakness, AKI, mild rhabdomyolysis, FTT.   Pt with improved appetite and oral intake; pt eating 50-100% of meals and drinking some Ensure. Pt is refeeding, Mg critically low; will order Phosphorus lab for in the morning. Pharmacy following for electrolyte management. Pt noted to have type 2 BM today; recommend bowel regimen as needed per MD. Per chart, pt down ~3lbs since admit.   Medications reviewed and include: aspirin, oscal, folic acid, heparin, MVI, protonix, thiamine, Mg sulfate   Labs reviewed: Mg 0.7(L), K 3.3(L)  Diet Order:   Diet Order            Diet regular Room service appropriate? Yes; Fluid consistency: Thin  Diet effective now             EDUCATION NEEDS:   Education needs have been addressed  Skin:  Skin Assessment: Skin Integrity Issues:(MSAD to coccyx)  Last BM:  12/22- type 2  Height:   Ht Readings from Last 1 Encounters:  01/24/19 5\' 10"  (1.778 m)    Weight:   Wt Readings from Last 1 Encounters:  01/29/19 55.7 kg   Ideal Body Weight:  75.5 kg  BMI:  Body mass index is 17.63 kg/m.  Estimated Nutritional Needs:   Kcal:  1600-1800  Protein:  80-90 grams  Fluid:  1.6-1.8 L/day  Koleen Distance MS, RD, LDN Pager #- [phone] Office#- [phone] After Hours Pager: [phone]

## 2019-01-30 DIAGNOSIS — R531 Weakness: Secondary | ICD-10-CM

## 2019-01-30 LAB — MAGNESIUM: Magnesium: 1.8 mg/dL (ref 1.7–2.4)

## 2019-01-30 LAB — SARS CORONAVIRUS 2 (TAT 6-24 HRS): SARS Coronavirus 2: NEGATIVE

## 2019-01-30 LAB — PHOSPHORUS: Phosphorus: 1.6 mg/dL — ABNORMAL LOW (ref 2.5–4.6)

## 2019-01-30 LAB — BASIC METABOLIC PANEL
Anion gap: 10 (ref 5–15)
BUN: 29 mg/dL — ABNORMAL HIGH (ref 8–23)
CO2: 26 mmol/L (ref 22–32)
Calcium: 7.3 mg/dL — ABNORMAL LOW (ref 8.9–10.3)
Chloride: 95 mmol/L — ABNORMAL LOW (ref 98–111)
Creatinine, Ser: 0.83 mg/dL (ref 0.61–1.24)
GFR calc Af Amer: >60 mL/min (ref >60)
GFR calc non Af Amer: >60 mL/min (ref >60)
Glucose, Bld: 109 mg/dL — ABNORMAL HIGH (ref 70–99)
Potassium: 3.6 mmol/L (ref 3.5–5.1)
Sodium: 131 mmol/L — ABNORMAL LOW (ref 135–145)

## 2019-01-30 MED ORDER — K PHOS MONO-SOD PHOS DI & MONO 155-852-130 MG PO TABS
500.0000 mg | ORAL_TABLET | ORAL | Status: AC
  Administered 2019-01-30 (×3): 500 mg via ORAL
  Filled 2019-01-30 (×4): qty 2

## 2019-01-30 MED ORDER — MAGNESIUM SULFATE 2 GM/50ML IV SOLN
2.0000 g | Freq: Once | INTRAVENOUS | Status: AC
  Administered 2019-01-30: 10:00:00 2 g via INTRAVENOUS
  Filled 2019-01-30: qty 50

## 2019-01-30 NOTE — Progress Notes (Signed)
PT Cancellation Note  Patient Details Name: Daniel Edwards MRN: [id number] DOB: [birth year]   Cancelled Treatment:    Reason Eval/Treat Not Completed: Patient declined, no reason specified. States he just wants to take a nap. Patient continues to decline despite encouragement. Will re-attempt at later date.    Nannie Starzyk 01/30/2019, 1:50 PM  {

## 2019-01-30 NOTE — TOC Progression Note (Addendum)
Transition of Care [facility]) - Progression Note    Patient Details  Name: Daniel Edwards MRN: [id number] Date of Birth: 1934-04-07  Transition of Care [facility]) CM/SW [place], RN Phone Number: 01/30/2019, 9:29 AM  Clinical Narrative:     SNF has received authorization for patient stay.Covd test ordered at 930am  Awaiting Covid results.  Patient will be able t.o transfer today with a negative result.    Expected Discharge Plan: [place] Barriers to Discharge: SNF Pending bed offer  Expected Discharge Plan and Services Expected Discharge Plan: [place]   Discharge Planning Services: CM Consult                                           Social Determinants of Health (SDOH) Interventions    Readmission Risk Interventions Readmission Risk Prevention Plan 11/18/2018  Transportation Screening Complete  Some recent data might be hidden

## 2019-01-30 NOTE — Progress Notes (Signed)
PROGRESS NOTE    HELAMAN KERRICK  [id number] DOB: 28-Nov-1934 DOA: 01/24/2019 PCP: Baxter Hire, MD    Brief Narrative:  Cleatrice Burke a 84 y.o.malewith medical history significant ofalcoholism, hypertension, atrial fibrillation GERD who was brought in by family due to failing to thrive at home. He has progressively gotten weaker. Patient has not been eating or drinking just taking alcohol daily. Patient has generalized debility to the point that he is not able to move around on his own. Family believes he is unable to take care of himself at home. He lives alone. In the ER patient was found to be weak dehydrated acute kidney injury and hypokalemic. He is being admitted to the hospital for treatment of generalized weakness as well as failure to thrive..  Facility the patient was initially planning to dispo to has refused due to outbreak of Covid cases.  Currently pending facility placement.    Consultants:   None  Procedures: None  Antimicrobials:   None   Subjective: Patient has no complaints.  Reports eating well and nursing verified he has been eating well.  Objective: Vitals:   01/30/19 0435 01/30/19 0805 01/30/19 1120 01/30/19 1601  BP: (!) 102/51 114/62 (!) 117/55 (!) 105/57  Pulse: 62 60  64  Resp: 14 18  18   Temp: 98 F (36.7 C) 98.2 F (36.8 C)  98.6 F (37 C)  TempSrc: Oral Oral  Oral  SpO2: 93% 93%  95%  Weight: 57.5 kg     Height:        Intake/Output Summary (Last 24 hours) at 01/30/2019 1635 Last data filed at 01/30/2019 1300 Gross per 24 hour  Intake 240 ml  Output 200 ml  Net 40 ml   Filed Weights   01/26/19 0406 01/29/19 0430 01/30/19 0435  Weight: 57.4 kg 55.7 kg 57.5 kg    Examination:  General exam: Appears calm and comfortable  Respiratory system: Clear to auscultation. Respiratory effort normal. Cardiovascular system: S1 & S2 heard, RRR. No JVD, murmurs, rubs, gallops or clicks. No pedal edema. Gastrointestinal  system: Abdomen is nondistended, soft and nontender. No organomegaly or masses felt. Normal bowel sounds heard. Central nervous system: Alert and oriented. No focal neurological deficits. Extremities: Symmetric 5 x 5 power. Skin: No rashes, lesions or ulcers Psychiatry: Judgement and insight appear normal. Mood & affect appropriate.     Data Reviewed: I have personally reviewed following labs and imaging studies  CBC: Recent Labs  Lab 01/24/19 1935 01/25/19 0733  WBC 14.4* 11.7*  HGB 13.3 13.0  HCT 38.6* 37.5*  MCV 85.0 84.1  PLT 279 0000000   Basic Metabolic Panel: Recent Labs  Lab 01/25/19 0733 01/25/19 0733 01/25/19 1808 01/26/19 0615 01/27/19 0627 01/28/19 0633 01/29/19 0455 01/30/19 0346  NA 137  --   --  140 140 139 136 131*  K 2.9*   < > 4.5 5.6* 4.2 3.8 3.3* 3.6  CL 98  --   --  111 111 104 97* 95*  CO2 26  --   --  23 23 30 27 26   GLUCOSE 68*  --   --  64* 75 96 92 109*  BUN 34*  --   --  33* 28* 21 17 29*  CREATININE 1.25*  --   --  0.90 0.84 0.76 0.66 0.83  CALCIUM 6.4*  --   --  6.7* 6.4* 7.0* 7.5* 7.3*  MG 0.6*  --  1.8  --   --   --  0.7*  0.6* 1.8  PHOS 3.5  --   --   --   --   --   --  1.6*   < > = values in this interval not displayed.   GFR: Estimated Creatinine Clearance: 55.8 mL/min (by C-G formula based on SCr of 0.83 mg/dL). Liver Function Tests: Recent Labs  Lab 01/25/19 0733  AST 46*  ALT 23  ALKPHOS 51  BILITOT 0.9  PROT 6.3*  ALBUMIN 3.5   No results for input(s): LIPASE, AMYLASE in the last 168 hours. No results for input(s): AMMONIA in the last 168 hours. Coagulation Profile: No results for input(s): INR, PROTIME in the last 168 hours. Cardiac Enzymes: Recent Labs  Lab 01/24/19 1935  CKTOTAL 725*   BNP (last 3 results) No results for input(s): PROBNP in the last 8760 hours. HbA1C: No results for input(s): HGBA1C in the last 72 hours. CBG: No results for input(s): GLUCAP in the last 168 hours. Lipid Profile: No results  for input(s): CHOL, HDL, LDLCALC, TRIG, CHOLHDL, LDLDIRECT in the last 72 hours. Thyroid Function Tests: No results for input(s): TSH, T4TOTAL, FREET4, T3FREE, THYROIDAB in the last 72 hours. Anemia Panel: No results for input(s): VITAMINB12, FOLATE, FERRITIN, TIBC, IRON, RETICCTPCT in the last 72 hours. Sepsis Labs: No results for input(s): PROCALCITON, LATICACIDVEN in the last 168 hours.  Recent Results (from the past 240 hour(s))  SARS CORONAVIRUS 2 (TAT 6-24 HRS) Nasopharyngeal Nasopharyngeal Swab     Status: None   Collection Time: 01/24/19 10:14 PM   Specimen: Nasopharyngeal Swab  Result Value Ref Range Status   SARS Coronavirus 2 NEGATIVE NEGATIVE Final    Comment: (NOTE) SARS-CoV-2 target nucleic acids are NOT DETECTED. The SARS-CoV-2 RNA is generally detectable in upper and lower respiratory specimens during the acute phase of infection. Negative results do not preclude SARS-CoV-2 infection, do not rule out co-infections with other pathogens, and should not be used as the sole basis for treatment or other patient management decisions. Negative results must be combined with clinical observations, patient history, and epidemiological information. The expected result is Negative. Fact Sheet for Patients: SugarRoll.be Fact Sheet for Healthcare Providers: https://www.woods-mathews.com/ This test is not yet approved or cleared by the [place] FDA and  has been authorized for detection and/or diagnosis of SARS-CoV-2 by FDA under an Emergency Use Authorization (EUA). This EUA will remain  in effect (meaning this test can be used) for the duration of the COVID-19 declaration under Section 56 4(b)(1) of the Act, 21 U.S.C. section 360bbb-3(b)(1), unless the authorization is terminated or revoked sooner. Performed at [facility], [place] [address], [place], [place] [address]          Radiology Studies: No results  found.      Scheduled Meds: . aspirin EC  325 mg Oral Daily  . feeding supplement (ENSURE ENLIVE)  237 mL Oral TID BM  . folic acid  1 mg Oral Daily  . heparin  5,000 Units Subcutaneous Q8H  . metoprolol tartrate  25 mg Oral BID  . multivitamin with minerals  1 tablet Oral Daily  . pantoprazole  40 mg Oral Daily  . phosphorus  500 mg Oral Q4H  . sodium chloride flush  10 mL Intravenous Q12H  . tamsulosin  0.4 mg Oral Daily  . thiamine  100 mg Oral Daily   Or  . thiamine  100 mg Intravenous Daily   Continuous Infusions: . sodium chloride      Assessment &  Plan:   Principal Problem:   Weakness generalized Active Problems:   A-fib (HCC)   Rhabdomyolysis   Hypokalemia   Alcoholism ([place])   ARF (acute renal failure) (HCC)   FTT (failure to thrive) in adult   Protein-calorie malnutrition, severe   1 generalized weakness Multifactorial likely owing to impaired dietary intake as well as alcohol intake DC IVF, creatinine improved and patient tolerating PO Replete electrolytes as necessary Therapy and Occupational Therapy consults: rec SNF Nutrition consult: Severe malnutrition noted Social services consult [facility], will [facility]  #2 acute kidney injury:resolved Most likely prerenal  DC IVF  #3 hypokalemia/Hypomagnesemia and hypophosphatemia- Replacing. k nml Replace mg and phos today    #4 alcohol abuse: Initiate CIWA protocol. Per patient's son, the patient drinks approximately 6-8 beers a day and has been doing so for quite a while.  Only recently has statin drinking a little less because he is unable to go to the store to get the beer.  #5 mild rhabdomyolysis: Most likely secondary to alcoholism with possible fall.  Hydrate and monitor CPK  #6 A. [id number] is in sinus rhythm.  #7 Severe protein calorie malnutrition Likely owing to chronic alcohol abuse Social services consult Nutrition consulted, see note  patient will  benefit from skilled nursing facility versus long-term care placement   DVT prophylaxis:.  Heparin subcu Code Status: Full Family Communication: None at bedside Disposition Plan: SNF pending, ordered Covid pending       LOS: 6 days   Time spent: 45 minutes with more than 50% COC    Nolberto Hanlon, MD [facility] Pager [phone]  If 7PM-7AM, please contact night-coverage [email or web address] Password [facility] 01/30/2019, 4:35 PM

## 2019-01-30 NOTE — Progress Notes (Signed)
Pharmacy Electrolyte Monitoring Consult:  Pharmacy consulted to assist in monitoring and replacing electrolytes in this 84 y.o. male admitted on 01/24/2019 with Weakness  Monitor magnesium, potassium, and phosphorus daily for at least 3 days, MD to replete as needed, as pt is at risk for refeeding syndrome given severe malnutrition and hx of EtOH abuse.  Labs:  Sodium (mmol/L)  Date Value  01/30/2019 131 (L)  02/23/2013 130 (L)   Potassium (mmol/L)  Date Value  01/30/2019 3.6  02/23/2013 3.6   Magnesium (mg/dL)  Date Value  01/30/2019 1.8  01/28/2013 2.0   Phosphorus (mg/dL)  Date Value  01/30/2019 1.6 (L)  12/14/2012 3.2   Calcium (mg/dL)  Date Value  01/30/2019 7.3 (L)   Calcium, Total (mg/dL)  Date Value  02/23/2013 8.4 (L)   Albumin (g/dL)  Date Value  01/25/2019 3.5  01/26/2013 2.5 (L)   Corrected Ca 7.7   Assessment/Plan: Phos 1.6 - Will order KPhos 2 tabs every 4 hours x 4.  Mg 1.8 - will order Mg 2 g IV x 1   Will recheck electrolytes with am labs.  Oswald Hillock, PharmD, BCPS 01/30/2019 7:42 AM

## 2019-01-31 DIAGNOSIS — I4891 Unspecified atrial fibrillation: Secondary | ICD-10-CM | POA: Diagnosis not present

## 2019-01-31 DIAGNOSIS — Z79899 Other long term (current) drug therapy: Secondary | ICD-10-CM | POA: Diagnosis not present

## 2019-01-31 DIAGNOSIS — E43 Unspecified severe protein-calorie malnutrition: Secondary | ICD-10-CM | POA: Diagnosis not present

## 2019-01-31 DIAGNOSIS — M2550 Pain in unspecified joint: Secondary | ICD-10-CM | POA: Diagnosis not present

## 2019-01-31 DIAGNOSIS — K219 Gastro-esophageal reflux disease without esophagitis: Secondary | ICD-10-CM | POA: Diagnosis not present

## 2019-01-31 DIAGNOSIS — R6889 Other general symptoms and signs: Secondary | ICD-10-CM | POA: Diagnosis not present

## 2019-01-31 DIAGNOSIS — R0902 Hypoxemia: Secondary | ICD-10-CM | POA: Diagnosis not present

## 2019-01-31 DIAGNOSIS — M6281 Muscle weakness (generalized): Secondary | ICD-10-CM | POA: Diagnosis not present

## 2019-01-31 DIAGNOSIS — R531 Weakness: Secondary | ICD-10-CM | POA: Diagnosis not present

## 2019-01-31 DIAGNOSIS — F1010 Alcohol abuse, uncomplicated: Secondary | ICD-10-CM | POA: Diagnosis not present

## 2019-01-31 DIAGNOSIS — I10 Essential (primary) hypertension: Secondary | ICD-10-CM | POA: Diagnosis not present

## 2019-01-31 DIAGNOSIS — D649 Anemia, unspecified: Secondary | ICD-10-CM | POA: Diagnosis not present

## 2019-01-31 DIAGNOSIS — R627 Adult failure to thrive: Secondary | ICD-10-CM | POA: Diagnosis not present

## 2019-01-31 DIAGNOSIS — R296 Repeated falls: Secondary | ICD-10-CM | POA: Diagnosis not present

## 2019-01-31 DIAGNOSIS — Z1159 Encounter for screening for other viral diseases: Secondary | ICD-10-CM | POA: Diagnosis not present

## 2019-01-31 DIAGNOSIS — I959 Hypotension, unspecified: Secondary | ICD-10-CM | POA: Diagnosis not present

## 2019-01-31 DIAGNOSIS — N179 Acute kidney failure, unspecified: Secondary | ICD-10-CM | POA: Diagnosis not present

## 2019-01-31 DIAGNOSIS — E569 Vitamin deficiency, unspecified: Secondary | ICD-10-CM | POA: Diagnosis not present

## 2019-01-31 DIAGNOSIS — Z7401 Bed confinement status: Secondary | ICD-10-CM | POA: Diagnosis not present

## 2019-01-31 LAB — CBC
HCT: 31.7 % — ABNORMAL LOW (ref 39.0–52.0)
Hemoglobin: 11 g/dL — ABNORMAL LOW (ref 13.0–17.0)
MCH: 29.3 pg (ref 26.0–34.0)
MCHC: 34.7 g/dL (ref 30.0–36.0)
MCV: 84.5 fL (ref 80.0–100.0)
Platelets: 196 10*3/uL (ref 150–400)
RBC: 3.75 MIL/uL — ABNORMAL LOW (ref 4.22–5.81)
RDW: 13.6 % (ref 11.5–15.5)
WBC: 10.6 10*3/uL — ABNORMAL HIGH (ref 4.0–10.5)
nRBC: 0 % (ref 0.0–0.2)

## 2019-01-31 LAB — BASIC METABOLIC PANEL
Anion gap: 11 (ref 5–15)
BUN: 28 mg/dL — ABNORMAL HIGH (ref 8–23)
CO2: 27 mmol/L (ref 22–32)
Calcium: 7.8 mg/dL — ABNORMAL LOW (ref 8.9–10.3)
Chloride: 99 mmol/L (ref 98–111)
Creatinine, Ser: 0.88 mg/dL (ref 0.61–1.24)
GFR calc Af Amer: >60 mL/min (ref >60)
GFR calc non Af Amer: >60 mL/min (ref >60)
Glucose, Bld: 100 mg/dL — ABNORMAL HIGH (ref 70–99)
Potassium: 3.8 mmol/L (ref 3.5–5.1)
Sodium: 137 mmol/L (ref 135–145)

## 2019-01-31 LAB — PHOSPHORUS: Phosphorus: 2.5 mg/dL (ref 2.5–4.6)

## 2019-01-31 LAB — MAGNESIUM: Magnesium: 1.8 mg/dL (ref 1.7–2.4)

## 2019-01-31 MED ORDER — MAGNESIUM OXIDE 400 (241.3 MG) MG PO TABS: 400.0000 mg | ORAL_TABLET | Freq: Two times a day (BID) | ORAL | Status: DC

## 2019-01-31 MED ORDER — MAGNESIUM OXIDE 400 (241.3 MG) MG PO TABS
400.0000 mg | ORAL_TABLET | Freq: Two times a day (BID) | ORAL | Status: DC
  Administered 2019-01-31: 400 mg via ORAL
  Filled 2019-01-31: qty 1

## 2019-01-31 NOTE — Progress Notes (Signed)
Pharmacy Electrolyte Monitoring Consult:  Pharmacy consulted to assist in monitoring and replacing electrolytes in this 84 y.o. male admitted on 01/24/2019 with Weakness  Monitor magnesium, potassium, and phosphorus daily for at least 3 days, MD to replete as needed, as pt is at risk for refeeding syndrome given severe malnutrition and hx of EtOH abuse.  Labs:  Sodium (mmol/L)  Date Value  01/31/2019 137  02/23/2013 130 (L)   Potassium (mmol/L)  Date Value  01/31/2019 3.8  02/23/2013 3.6   Magnesium (mg/dL)  Date Value  01/31/2019 1.8  01/28/2013 2.0   Phosphorus (mg/dL)  Date Value  01/31/2019 2.5  12/14/2012 3.2   Calcium (mg/dL)  Date Value  01/31/2019 7.8 (L)   Calcium, Total (mg/dL)  Date Value  02/23/2013 8.4 (L)   Albumin (g/dL)  Date Value  01/25/2019 3.5  01/26/2013 2.5 (L)   Corrected Ca 8.2  Assessment/Plan: Mg 1.8 - will start Mg oxide 400 mg BID.   Will recheck electrolytes with am labs.  Oswald Hillock, PharmD, BCPS 01/31/2019 7:47 AM

## 2019-01-31 NOTE — Progress Notes (Signed)
Iv and telemetry removed for discharge. Pt transported via stretcher accomapanied by 2 emt to [facility]. Reported called and given to the nurse  [facility] at [facility] . No distress noted

## 2019-01-31 NOTE — TOC Transition Note (Addendum)
Transition of Care [facility]) - CM/SW Discharge Note   Patient Details  Name: Daniel Edwards MRN: [id number] Date of Birth: [birth year]  [facility] CM/SW Contact:  Victorino Dike, RN Phone Number: 01/31/2019, 8:47 AM   Clinical Narrative:     [facility] to [facility] to [facility] today, room 707.  [place] notified via telephone.        Final next level of care: Skilled Nursing Facility Barriers to Discharge: Barriers Resolved   Patient Goals and CMS Choice        Discharge Placement           Patient to be transferred to facility by: [facility] Name of family member notified: Collene Leyden Patient and family notified of of transfer: 01/31/19  Discharge Plan and Services   Discharge Planning Services: CM Consult            DME Arranged: N/A DME Agency: NA       HH Arranged: NA HH Agency: NA        Social Determinants of Health ([place]) Interventions     Readmission Risk Interventions Readmission Risk Prevention Plan 11/18/2018  Transportation Screening Complete  Some recent data might be hidden

## 2019-01-31 NOTE — Discharge Summary (Signed)
Daniel Edwards [id number] DOB: 20-Dec-1934 DOA: 01/24/2019  PCP: Baxter Hire, MD  Admit date: 01/24/2019 Discharge date: 01/31/2019  Admitted From: Home Disposition:  [facility]  Recommendations for Outpatient Follow-up:  1. Follow up with PCP in 1 week 2. Please obtain BMP/CBC in one week 3. Please follow up on the following pending results:none  Home Health:no    Discharge Condition:Stable CODE STATUS: Full Diet recommendation: Heart Healthy , nutritional supplements  Brief/Interim Summary: Daniel Edwards is a 84 y.o. male with medical history significant of alcoholism, hypertension, atrial fibrillation GERD who was brought in by family due to failing to thrive at home.  He has progressively gotten weaker.  Patient has not been eating or drinking just taking alcohol daily.  Patient has generalized debility to the point that he is not able to move around on his own.  Family believes he is unable to take care of himself at home.  He lives alone.  In the ER patient was found to be weak dehydrated acute kidney injury and hypokalemic.  He is being admitted to the hospital for treatment of generalized weakness as well as failure to thrive.  He also has history of alcohol abuse and he was placed on CIWA a protocol.  He was found with hypokalemia hypomagnesemia and hypophosphatemia which were all replaced.  His acute kidney injury resolved with IV fluids.  He had PT OT which recommended SNF.  He started eating better and nutrition was consulted they recommended oral supplements.  With severe malnutrition .  His appetite definitely improved.  Was also found with mild bacterial myelolysis which improved with hydration and that was secondary to alcoholism with questionable possible fall.  Severe protein calorie malnutrition was likely due to his chronic alcohol abuse and decreased p.o. intake.  Today he is stable to be [facility].  Discharge Diagnoses:  Principal Problem:   Weakness  generalized Active Problems:   A-fib (HCC)   Rhabdomyolysis   Hypokalemia   Alcoholism ([place])   ARF (acute renal failure) (HCC)   FTT (failure to thrive) in adult   Protein-calorie malnutrition, severe    Discharge Instructions  Discharge Instructions    Call MD for:  temperature >100.4   Complete by: As directed    Diet - low sodium heart healthy   Complete by: As directed    Discharge instructions   Complete by: As directed    F/u with Dr. Edwina Barth  Your pcp   Increase activity slowly   Complete by: As directed      Allergies as of 01/31/2019   No Known Allergies     Medication List    TAKE these medications   aspirin 325 MG tablet Take 325 mg by mouth daily.   feeding supplement (ENSURE ENLIVE) Liqd Take 237 mLs by mouth 3 (three) times daily between meals.   folic acid 1 MG tablet Commonly known as: FOLVITE Take 1 tablet (1 mg total) by mouth daily.   loperamide 2 MG capsule Commonly known as: IMODIUM Take 1 capsule (2 mg total) by mouth as needed for diarrhea or loose stools.   magnesium oxide 400 (241.3 Mg) MG tablet Commonly known as: MAG-OX Take 1 tablet (400 mg total) by mouth 2 (two) times daily.   metoprolol tartrate 25 MG tablet Commonly known as: LOPRESSOR Take 1 tablet (25 mg total) by mouth 2 (two) times daily.   multivitamin with minerals Tabs tablet Take 1 tablet by mouth daily.   Omeprazole  20 MG Tbec Take 20 mg by mouth daily.   tamsulosin 0.4 MG Caps capsule Commonly known as: FLOMAX Take 1 capsule (0.4 mg total) by mouth daily.   thiamine 100 MG tablet Take 1 tablet (100 mg total) by mouth daily.       Contact information for follow-up providers    Baxter Hire, MD Follow up in 1 week(s).   Specialty: Internal Medicine Why: needs labs Contact information: [place] [address] [phone]            Contact information for after-discharge care    Destination    [facility] Preferred SNF .   Service: Skilled Nursing Contact information: [address] [place] [phone]                 No Known Allergies  Consultations:  None   Procedures/Studies: DG Chest 2 View  Result Date: 01/21/2019 CLINICAL DATA:  Atrial fibrillation, worsening sinus pain for 3 days EXAM: CHEST - 2 VIEW COMPARISON:  Radiograph 11/19/2018 FINDINGS: Chronic hyperinflation of the lungs with stable chronic bronchitic change. No consolidative opacity, convincing features of edema, pneumothorax or effusion. The aorta is calcified. The remaining cardiomediastinal contours are unremarkable. Degenerative changes are present in the imaged spine and shoulders. No acute osseous or soft tissue abnormality. IMPRESSION: 1. No acute cardiopulmonary findings. 2. Chronic hyperinflation and bronchitic changes may reflect features of COPD/chronic bronchitis. Correlate with history. 3. Aortic atherosclerosis. Electronically Signed   By: Lovena Le M.D.   On: 01/21/2019 22:25   DG Chest Port 1 View  Result Date: 01/24/2019 CLINICAL DATA:  Weakness, failure to thrive EXAM: PORTABLE CHEST 1 VIEW COMPARISON:  Radiograph 01/21/2019 FINDINGS: Chronic hyperinflation of the lungs with some coarse interstitial changes which are similar to prior studies. No consolidation, features of edema, pneumothorax, or effusion. The cardiomediastinal contours are unremarkable. No acute osseous or soft tissue abnormality. Degenerative changes are present in the imaged spine and shoulders. IMPRESSION: Stable appearance of the chest x-ray with chronic interstitial changes and hyperinflation. No acute cardiopulmonary findings are identified. Electronically Signed   By: Lovena Le M.D.   On: 01/24/2019 20:19       Subjective:  Patient seen and examined has no complaints today. Discharge Exam: Vitals:   01/31/19 0421 01/31/19 0746  BP: 129/68 (!) 162/67  Pulse: 81 66  Resp:  17   Temp: 98.5 F (36.9 C) 98 F (36.7 C)  SpO2: 92% 94%   Vitals:   01/30/19 1601 01/30/19 1912 01/31/19 0421 01/31/19 0746  BP: (!) 105/57 (!) 108/92 129/68 (!) 162/67  Pulse: 64 64 81 66  Resp: 18   17  Temp: 98.6 F (37 C) 98.1 F (36.7 C) 98.5 F (36.9 C) 98 F (36.7 C)  TempSrc: Oral Oral Oral Oral  SpO2: 95% 95% 92% 94%  Weight:   56.8 kg   Height:        General: Pt is alert, awake, not in acute distress Cardiovascular: RRR, S1/S2 +, no rubs, no gallops Respiratory: CTA bilaterally, no wheezing, no rhonchi Abdominal: Soft, NT, ND, bowel sounds + Extremities: no edema, no cyanosis    The results of significant diagnostics from this hospitalization (including imaging, microbiology, ancillary and laboratory) are listed below for reference.     Microbiology: Recent Results (from the past 240 hour(s))  SARS CORONAVIRUS 2 (TAT 6-24 HRS) Nasopharyngeal Nasopharyngeal Swab     Status: None   Collection  Time: 01/24/19 10:14 PM   Specimen: Nasopharyngeal Swab  Result Value Ref Range Status   SARS Coronavirus 2 NEGATIVE NEGATIVE Final    Comment: (NOTE) SARS-CoV-2 target nucleic acids are NOT DETECTED. The SARS-CoV-2 RNA is generally detectable in upper and lower respiratory specimens during the acute phase of infection. Negative results do not preclude SARS-CoV-2 infection, do not rule out co-infections with other pathogens, and should not be used as the sole basis for treatment or other patient management decisions. Negative results must be combined with clinical observations, patient history, and epidemiological information. The expected result is Negative. Fact Sheet for Patients: SugarRoll.be Fact Sheet for Healthcare Providers: https://www.woods-mathews.com/ This test is not yet approved or cleared by the [place] FDA and  has been authorized for detection and/or diagnosis of SARS-CoV-2 by FDA under an Emergency Use  Authorization (EUA). This EUA will remain  in effect (meaning this test can be used) for the duration of the COVID-19 declaration under Section 56 4(b)(1) of the Act, 21 U.S.C. section 360bbb-3(b)(1), unless the authorization is terminated or revoked sooner. Performed at [facility], [place] [address] [place], [place] [address]   SARS CORONAVIRUS 2 (TAT 6-24 HRS) Nasopharyngeal Nasopharyngeal Swab     Status: None   Collection Time: 01/30/19  9:25 AM   Specimen: Nasopharyngeal Swab  Result Value Ref Range Status   SARS Coronavirus 2 NEGATIVE NEGATIVE Final    Comment: (NOTE) SARS-CoV-2 target nucleic acids are NOT DETECTED. The SARS-CoV-2 RNA is generally detectable in upper and lower respiratory specimens during the acute phase of infection. Negative results do not preclude SARS-CoV-2 infection, do not rule out co-infections with other pathogens, and should not be used as the sole basis for treatment or other patient management decisions. Negative results must be combined with clinical observations, patient history, and epidemiological information. The expected result is Negative. Fact Sheet for Patients: SugarRoll.be Fact Sheet for Healthcare Providers: https://www.woods-mathews.com/ This test is not yet approved or cleared by the [place] FDA and  has been authorized for detection and/or diagnosis of SARS-CoV-2 by FDA under an Emergency Use Authorization (EUA). This EUA will remain  in effect (meaning this test can be used) for the duration of the COVID-19 declaration under Section 56 4(b)(1) of the Act, 21 U.S.C. section 360bbb-3(b)(1), unless the authorization is terminated or revoked sooner. Performed at [facility], [place] [address] [place], [place] [address]      Labs: BNP (last 3 results) No results for input(s): BNP in the last 8760 hours. Basic Metabolic Panel: Recent Labs  Lab 01/25/19 0733 01/25/19 0733  01/25/19 1808 01/27/19 0627 01/28/19 0633 01/29/19 0455 01/30/19 0346 01/31/19 0502  NA 137   < >  --  140 139 136 131* 137  K 2.9*  --  4.5 4.2 3.8 3.3* 3.6 3.8  CL 98   < >  --  111 104 97* 95* 99  CO2 26   < >  --  23 30 27 26 27   GLUCOSE 68*   < >  --  75 96 92 109* 100*  BUN 34*   < >  --  28* 21 17 29* 28*  CREATININE 1.25*   < >  --  0.84 0.76 0.66 0.83 0.88  CALCIUM 6.4*   < >  --  6.4* 7.0* 7.5* 7.3* 7.8*  MG 0.6*  --  1.8  --   --  0.7*  0.6* 1.8 1.8  PHOS 3.5  --   --   --   --   --  1.6* 2.5   < > = values in this interval not displayed.   Liver Function Tests: Recent Labs  Lab 01/25/19 0733  AST 46*  ALT 23  ALKPHOS 51  BILITOT 0.9  PROT 6.3*  ALBUMIN 3.5   No results for input(s): LIPASE, AMYLASE in the last 168 hours. No results for input(s): AMMONIA in the last 168 hours. CBC: Recent Labs  Lab 01/24/19 1935 01/25/19 0733 01/31/19 0502  WBC 14.4* 11.7* 10.6*  HGB 13.3 13.0 11.0*  HCT 38.6* 37.5* 31.7*  MCV 85.0 84.1 84.5  PLT 279 233 196   Cardiac Enzymes: Recent Labs  Lab 01/24/19 1935  CKTOTAL 725*   BNP: Invalid input(s): POCBNP CBG: No results for input(s): GLUCAP in the last 168 hours. D-Dimer No results for input(s): DDIMER in the last 72 hours. Hgb A1c No results for input(s): HGBA1C in the last 72 hours. Lipid Profile No results for input(s): CHOL, HDL, LDLCALC, TRIG, CHOLHDL, LDLDIRECT in the last 72 hours. Thyroid function studies No results for input(s): TSH, T4TOTAL, T3FREE, THYROIDAB in the last 72 hours.  Invalid input(s): FREET3 Anemia work up No results for input(s): VITAMINB12, FOLATE, FERRITIN, TIBC, IRON, RETICCTPCT in the last 72 hours. Urinalysis    Component Value Date/Time   COLORURINE YELLOW (A) 01/24/2019 1517   APPEARANCEUR HAZY (A) 01/24/2019 1517   APPEARANCEUR Cloudy 01/26/2013 1319   LABSPEC 1.016 01/24/2019 1517   LABSPEC 1.009 01/26/2013 1319   PHURINE 5.0 01/24/2019 1517   GLUCOSEU NEGATIVE  01/24/2019 1517   GLUCOSEU Negative 01/26/2013 1319   HGBUR NEGATIVE 01/24/2019 1517   BILIRUBINUR NEGATIVE 01/24/2019 1517   BILIRUBINUR Negative 01/26/2013 1319   KETONESUR NEGATIVE 01/24/2019 1517   PROTEINUR NEGATIVE 01/24/2019 1517   NITRITE NEGATIVE 01/24/2019 1517   LEUKOCYTESUR NEGATIVE 01/24/2019 1517   LEUKOCYTESUR 3+ 01/26/2013 1319   Sepsis Labs Invalid input(s): PROCALCITONIN,  WBC,  LACTICIDVEN Microbiology Recent Results (from the past 240 hour(s))  SARS CORONAVIRUS 2 (TAT 6-24 HRS) Nasopharyngeal Nasopharyngeal Swab     Status: None   Collection Time: 01/24/19 10:14 PM   Specimen: Nasopharyngeal Swab  Result Value Ref Range Status   SARS Coronavirus 2 NEGATIVE NEGATIVE Final    Comment: (NOTE) SARS-CoV-2 target nucleic acids are NOT DETECTED. The SARS-CoV-2 RNA is generally detectable in upper and lower respiratory specimens during the acute phase of infection. Negative results do not preclude SARS-CoV-2 infection, do not rule out co-infections with other pathogens, and should not be used as the sole basis for treatment or other patient management decisions. Negative results must be combined with clinical observations, patient history, and epidemiological information. The expected result is Negative. Fact Sheet for Patients: SugarRoll.be Fact Sheet for Healthcare Providers: https://www.woods-mathews.com/ This test is not yet approved or cleared by the [place] FDA and  has been authorized for detection and/or diagnosis of SARS-CoV-2 by FDA under an Emergency Use Authorization (EUA). This EUA will remain  in effect (meaning this test can be used) for the duration of the COVID-19 declaration under Section 56 4(b)(1) of the Act, 21 U.S.C. section 360bbb-3(b)(1), unless the authorization is terminated or revoked sooner. Performed at [facility], [place] [address], [place], [place] [address]   SARS CORONAVIRUS 2  (TAT 6-24 HRS) Nasopharyngeal Nasopharyngeal Swab     Status: None   Collection Time: 01/30/19  9:25 AM   Specimen: Nasopharyngeal Swab  Result Value Ref Range Status   SARS Coronavirus 2 NEGATIVE NEGATIVE Final    Comment: (NOTE)  SARS-CoV-2 target nucleic acids are NOT DETECTED. The SARS-CoV-2 RNA is generally detectable in upper and lower respiratory specimens during the acute phase of infection. Negative results do not preclude SARS-CoV-2 infection, do not rule out co-infections with other pathogens, and should not be used as the sole basis for treatment or other patient management decisions. Negative results must be combined with clinical observations, patient history, and epidemiological information. The expected result is Negative. Fact Sheet for Patients: SugarRoll.be Fact Sheet for Healthcare Providers: https://www.woods-mathews.com/ This test is not yet approved or cleared by the [place] FDA and  has been authorized for detection and/or diagnosis of SARS-CoV-2 by FDA under an Emergency Use Authorization (EUA). This EUA will remain  in effect (meaning this test can be used) for the duration of the COVID-19 declaration under Section 56 4(b)(1) of the Act, 21 U.S.C. section 360bbb-3(b)(1), unless the authorization is terminated or revoked sooner. Performed at [facility], [place] [address] [place], [place] [address]      Time coordinating discharge: Over 30 minutes  SIGNED:   Nolberto Hanlon, MD  [facility] 01/31/2019, 10:59 AM Pager   If 7PM-7AM, please contact night-coverage [email or web address] Password [email or web address]

## 2019-01-31 NOTE — Plan of Care (Signed)
  Problem: Education: Goal: Knowledge of General Education information will improve Description: Including pain rating scale, medication(s)/side effects and non-pharmacologic comfort measures Outcome: Progressing   Problem: Clinical Measurements: Goal: Diagnostic test results will improve Outcome: Progressing Goal: Cardiovascular complication will be avoided Outcome: Progressing   Problem: Safety: Goal: Ability to remain free from injury will improve Outcome: Progressing

## 2019-01-31 NOTE — Care Management Important Message (Signed)
Important Message  Patient Details  Name: Daniel Edwards MRN: [id number] Date of Birth: 11/03/1934   Medicare Important Message Given:  Yes     [name] Check 01/31/2019, 10:32 AM

## 2019-01-31 NOTE — Progress Notes (Signed)
PT Cancellation Note  Patient Details Name: Daniel Edwards MRN: [id number] DOB: Jan 04, 1935   Cancelled Treatment:    Reason Eval/Treat Not Completed: Other (comment)   Offered and encouraged.  Pt stated he was being discharged to [facility] today and did not want to participate while waiting.  Encouraged but he continued to decline.   Chesley Noon 01/31/2019, 10:42 AM

## 2019-02-04 ENCOUNTER — Other Ambulatory Visit: Payer: Self-pay

## 2019-02-04 NOTE — Patient Outreach (Signed)
[facility]) Care Management  02/04/2019  HERBER RICARDEZ 06-07-1934 [id number]    EMMI-General Discharge RED ON EMMI ALERT Day # 1 Date: 02/02/2019 Red Alert Reason: 'got discharge papers? I don't know  Know who to call about changes in condition? No"   Upon chart review noted that patient discharged from hospital to SNF for rehab. Patient not appropriate for automated EMMI-General Discharge calls as he remains inpatient.        Plan: RN CM will notify [facility] administrative assistant to deactivate automated calls.  Enzo Montgomery, RN,BSN,CCM [place] Management Telephonic Care Management Coordinator Direct Phone: [phone] Toll Free: [phone] Fax: [phone]

## 2019-02-05 DIAGNOSIS — I10 Essential (primary) hypertension: Secondary | ICD-10-CM | POA: Diagnosis not present

## 2019-02-05 DIAGNOSIS — R296 Repeated falls: Secondary | ICD-10-CM | POA: Diagnosis not present

## 2019-02-05 DIAGNOSIS — F1010 Alcohol abuse, uncomplicated: Secondary | ICD-10-CM | POA: Diagnosis not present

## 2019-02-05 DIAGNOSIS — I4891 Unspecified atrial fibrillation: Secondary | ICD-10-CM | POA: Diagnosis not present

## 2019-02-05 DIAGNOSIS — M6281 Muscle weakness (generalized): Secondary | ICD-10-CM | POA: Diagnosis not present

## 2019-02-11 DIAGNOSIS — I4891 Unspecified atrial fibrillation: Secondary | ICD-10-CM | POA: Diagnosis not present

## 2019-02-11 DIAGNOSIS — G4700 Insomnia, unspecified: Secondary | ICD-10-CM | POA: Diagnosis not present

## 2019-02-11 DIAGNOSIS — I700 Atherosclerosis of aorta: Secondary | ICD-10-CM | POA: Diagnosis not present

## 2019-02-11 DIAGNOSIS — J449 Chronic obstructive pulmonary disease, unspecified: Secondary | ICD-10-CM | POA: Diagnosis not present

## 2019-02-11 DIAGNOSIS — E119 Type 2 diabetes mellitus without complications: Secondary | ICD-10-CM | POA: Diagnosis not present

## 2019-02-11 DIAGNOSIS — R627 Adult failure to thrive: Secondary | ICD-10-CM | POA: Diagnosis not present

## 2019-02-11 DIAGNOSIS — E43 Unspecified severe protein-calorie malnutrition: Secondary | ICD-10-CM | POA: Diagnosis not present

## 2019-02-11 DIAGNOSIS — K219 Gastro-esophageal reflux disease without esophagitis: Secondary | ICD-10-CM | POA: Diagnosis not present

## 2019-02-11 DIAGNOSIS — N179 Acute kidney failure, unspecified: Secondary | ICD-10-CM | POA: Diagnosis not present

## 2019-02-13 DIAGNOSIS — I4891 Unspecified atrial fibrillation: Secondary | ICD-10-CM | POA: Diagnosis not present

## 2019-02-13 DIAGNOSIS — K219 Gastro-esophageal reflux disease without esophagitis: Secondary | ICD-10-CM | POA: Diagnosis not present

## 2019-02-13 DIAGNOSIS — E119 Type 2 diabetes mellitus without complications: Secondary | ICD-10-CM | POA: Diagnosis not present

## 2019-02-13 DIAGNOSIS — N179 Acute kidney failure, unspecified: Secondary | ICD-10-CM | POA: Diagnosis not present

## 2019-02-13 DIAGNOSIS — G4700 Insomnia, unspecified: Secondary | ICD-10-CM | POA: Diagnosis not present

## 2019-02-13 DIAGNOSIS — I700 Atherosclerosis of aorta: Secondary | ICD-10-CM | POA: Diagnosis not present

## 2019-02-13 DIAGNOSIS — E43 Unspecified severe protein-calorie malnutrition: Secondary | ICD-10-CM | POA: Diagnosis not present

## 2019-02-13 DIAGNOSIS — R627 Adult failure to thrive: Secondary | ICD-10-CM | POA: Diagnosis not present

## 2019-02-13 DIAGNOSIS — J449 Chronic obstructive pulmonary disease, unspecified: Secondary | ICD-10-CM | POA: Diagnosis not present

## 2019-02-18 ENCOUNTER — Other Ambulatory Visit: Payer: Self-pay

## 2019-02-18 ENCOUNTER — Inpatient Hospital Stay
Admission: EM | Admit: 2019-02-18 | Discharge: 2019-03-01 | DRG: 193 | Disposition: A | Discharge status: Skilled Nursing Facility | Payer: Medicare HMO | Attending: Internal Medicine | Admitting: Internal Medicine

## 2019-02-18 ENCOUNTER — Emergency Department: Payer: Medicare HMO

## 2019-02-18 DIAGNOSIS — J181 Lobar pneumonia, unspecified organism: Principal | ICD-10-CM

## 2019-02-18 DIAGNOSIS — E876 Hypokalemia: Secondary | ICD-10-CM | POA: Diagnosis present

## 2019-02-18 DIAGNOSIS — N400 Enlarged prostate without lower urinary tract symptoms: Secondary | ICD-10-CM | POA: Diagnosis not present

## 2019-02-18 DIAGNOSIS — E43 Unspecified severe protein-calorie malnutrition: Secondary | ICD-10-CM | POA: Diagnosis not present

## 2019-02-18 DIAGNOSIS — R410 Disorientation, unspecified: Secondary | ICD-10-CM | POA: Diagnosis not present

## 2019-02-18 DIAGNOSIS — M2550 Pain in unspecified joint: Secondary | ICD-10-CM | POA: Diagnosis not present

## 2019-02-18 DIAGNOSIS — Z681 Body mass index (BMI) 19 or less, adult: Secondary | ICD-10-CM

## 2019-02-18 DIAGNOSIS — Z20822 Contact with and (suspected) exposure to covid-19: Secondary | ICD-10-CM | POA: Diagnosis not present

## 2019-02-18 DIAGNOSIS — G4700 Insomnia, unspecified: Secondary | ICD-10-CM | POA: Diagnosis not present

## 2019-02-18 DIAGNOSIS — R404 Transient alteration of awareness: Secondary | ICD-10-CM | POA: Diagnosis not present

## 2019-02-18 DIAGNOSIS — I10 Essential (primary) hypertension: Secondary | ICD-10-CM | POA: Diagnosis not present

## 2019-02-18 DIAGNOSIS — Z79899 Other long term (current) drug therapy: Secondary | ICD-10-CM | POA: Diagnosis not present

## 2019-02-18 DIAGNOSIS — R531 Weakness: Secondary | ICD-10-CM | POA: Diagnosis not present

## 2019-02-18 DIAGNOSIS — I493 Ventricular premature depolarization: Secondary | ICD-10-CM | POA: Diagnosis present

## 2019-02-18 DIAGNOSIS — Z9181 History of falling: Secondary | ICD-10-CM | POA: Diagnosis not present

## 2019-02-18 DIAGNOSIS — R000 Tachycardia, unspecified: Secondary | ICD-10-CM | POA: Diagnosis not present

## 2019-02-18 DIAGNOSIS — I4891 Unspecified atrial fibrillation: Secondary | ICD-10-CM | POA: Diagnosis not present

## 2019-02-18 DIAGNOSIS — R0902 Hypoxemia: Secondary | ICD-10-CM | POA: Diagnosis not present

## 2019-02-18 DIAGNOSIS — K219 Gastro-esophageal reflux disease without esophagitis: Secondary | ICD-10-CM | POA: Diagnosis present

## 2019-02-18 DIAGNOSIS — J189 Pneumonia, unspecified organism: Secondary | ICD-10-CM | POA: Diagnosis present

## 2019-02-18 DIAGNOSIS — I480 Paroxysmal atrial fibrillation: Secondary | ICD-10-CM

## 2019-02-18 DIAGNOSIS — I482 Chronic atrial fibrillation, unspecified: Secondary | ICD-10-CM | POA: Diagnosis not present

## 2019-02-18 DIAGNOSIS — F17210 Nicotine dependence, cigarettes, uncomplicated: Secondary | ICD-10-CM | POA: Diagnosis present

## 2019-02-18 DIAGNOSIS — R5381 Other malaise: Secondary | ICD-10-CM | POA: Diagnosis not present

## 2019-02-18 DIAGNOSIS — N179 Acute kidney failure, unspecified: Secondary | ICD-10-CM | POA: Diagnosis not present

## 2019-02-18 DIAGNOSIS — R4182 Altered mental status, unspecified: Secondary | ICD-10-CM | POA: Diagnosis not present

## 2019-02-18 DIAGNOSIS — R0602 Shortness of breath: Secondary | ICD-10-CM | POA: Diagnosis not present

## 2019-02-18 DIAGNOSIS — Z7982 Long term (current) use of aspirin: Secondary | ICD-10-CM | POA: Diagnosis not present

## 2019-02-18 DIAGNOSIS — F1010 Alcohol abuse, uncomplicated: Secondary | ICD-10-CM | POA: Diagnosis present

## 2019-02-18 DIAGNOSIS — R627 Adult failure to thrive: Secondary | ICD-10-CM | POA: Diagnosis present

## 2019-02-18 DIAGNOSIS — R0600 Dyspnea, unspecified: Secondary | ICD-10-CM | POA: Diagnosis not present

## 2019-02-18 DIAGNOSIS — J9601 Acute respiratory failure with hypoxia: Secondary | ICD-10-CM | POA: Diagnosis not present

## 2019-02-18 DIAGNOSIS — N3000 Acute cystitis without hematuria: Secondary | ICD-10-CM

## 2019-02-18 DIAGNOSIS — I4510 Unspecified right bundle-branch block: Secondary | ICD-10-CM | POA: Diagnosis not present

## 2019-02-18 DIAGNOSIS — Z7401 Bed confinement status: Secondary | ICD-10-CM | POA: Diagnosis not present

## 2019-02-18 DIAGNOSIS — J449 Chronic obstructive pulmonary disease, unspecified: Secondary | ICD-10-CM | POA: Diagnosis not present

## 2019-02-18 DIAGNOSIS — J440 Chronic obstructive pulmonary disease with acute lower respiratory infection: Secondary | ICD-10-CM | POA: Diagnosis not present

## 2019-02-18 DIAGNOSIS — I499 Cardiac arrhythmia, unspecified: Secondary | ICD-10-CM | POA: Diagnosis not present

## 2019-02-18 DIAGNOSIS — E119 Type 2 diabetes mellitus without complications: Secondary | ICD-10-CM | POA: Diagnosis not present

## 2019-02-18 LAB — COMPREHENSIVE METABOLIC PANEL
ALT: 34 U/L (ref 0–44)
AST: 39 U/L (ref 15–41)
Albumin: 3.8 g/dL (ref 3.5–5.0)
Alkaline Phosphatase: 55 U/L (ref 38–126)
Anion gap: 16 — ABNORMAL HIGH (ref 5–15)
BUN: 34 mg/dL — ABNORMAL HIGH (ref 8–23)
CO2: 25 mmol/L (ref 22–32)
Calcium: 7 mg/dL — ABNORMAL LOW (ref 8.9–10.3)
Chloride: 96 mmol/L — ABNORMAL LOW (ref 98–111)
Creatinine, Ser: 1.7 mg/dL — ABNORMAL HIGH (ref 0.61–1.24)
GFR calc Af Amer: 43 mL/min — ABNORMAL LOW (ref >60)
GFR calc non Af Amer: 37 mL/min — ABNORMAL LOW (ref >60)
Glucose, Bld: 165 mg/dL — ABNORMAL HIGH (ref 70–99)
Potassium: 3.5 mmol/L (ref 3.5–5.1)
Sodium: 137 mmol/L (ref 135–145)
Total Bilirubin: 0.7 mg/dL (ref 0.3–1.2)
Total Protein: 6.9 g/dL (ref 6.5–8.1)

## 2019-02-18 LAB — CBC WITH DIFFERENTIAL/PLATELET
Abs Immature Granulocytes: 0.11 10*3/uL — ABNORMAL HIGH (ref 0.00–0.07)
Basophils Absolute: 0 10*3/uL (ref 0.0–0.1)
Basophils Relative: 0 %
Eosinophils Absolute: 0 10*3/uL (ref 0.0–0.5)
Eosinophils Relative: 0 %
HCT: 37.3 % — ABNORMAL LOW (ref 39.0–52.0)
Hemoglobin: 12.8 g/dL — ABNORMAL LOW (ref 13.0–17.0)
Immature Granulocytes: 1 %
Lymphocytes Relative: 5 %
Lymphs Abs: 0.7 10*3/uL (ref 0.7–4.0)
MCH: 29 pg (ref 26.0–34.0)
MCHC: 34.3 g/dL (ref 30.0–36.0)
MCV: 84.4 fL (ref 80.0–100.0)
Monocytes Absolute: 0.6 10*3/uL (ref 0.1–1.0)
Monocytes Relative: 4 %
Neutro Abs: 12.5 10*3/uL — ABNORMAL HIGH (ref 1.7–7.7)
Neutrophils Relative %: 90 %
Platelets: 379 10*3/uL (ref 150–400)
RBC: 4.42 MIL/uL (ref 4.22–5.81)
RDW: 13.4 % (ref 11.5–15.5)
WBC: 13.9 10*3/uL — ABNORMAL HIGH (ref 4.0–10.5)
nRBC: 0 % (ref 0.0–0.2)

## 2019-02-18 LAB — URINALYSIS, COMPLETE (UACMP) WITH MICROSCOPIC
Bacteria, UA: NONE SEEN
Bilirubin Urine: NEGATIVE
Glucose, UA: NEGATIVE mg/dL
Ketones, ur: NEGATIVE mg/dL
Leukocytes,Ua: NEGATIVE
Nitrite: NEGATIVE
Protein, ur: 30 mg/dL — AB
Specific Gravity, Urine: 1.017 (ref 1.005–1.030)
Squamous Epithelial / HPF: NONE SEEN (ref 0–5)
WBC, UA: NONE SEEN WBC/hpf (ref 0–5)
pH: 5 (ref 5.0–8.0)

## 2019-02-18 LAB — TROPONIN I (HIGH SENSITIVITY): Troponin I (High Sensitivity): 36 ng/L — ABNORMAL HIGH (ref <18)

## 2019-02-18 MED ORDER — MAGNESIUM OXIDE 400 (241.3 MG) MG PO TABS
400.0000 mg | ORAL_TABLET | Freq: Two times a day (BID) | ORAL | Status: DC
  Administered 2019-02-18 – 2019-03-01 (×22): 400 mg via ORAL
  Filled 2019-02-18 (×23): qty 1

## 2019-02-18 MED ORDER — ADULT MULTIVITAMIN W/MINERALS CH
1.0000 | ORAL_TABLET | Freq: Every day | ORAL | Status: DC
  Administered 2019-02-18 – 2019-02-26 (×9): 1 via ORAL
  Filled 2019-02-18 (×9): qty 1

## 2019-02-18 MED ORDER — SODIUM CHLORIDE 0.9 % IV SOLN: Freq: Once | INTRAVENOUS | Status: AC

## 2019-02-18 MED ORDER — ASPIRIN EC 325 MG PO TBEC
325.0000 mg | DELAYED_RELEASE_TABLET | Freq: Every day | ORAL | Status: DC
  Administered 2019-02-18 – 2019-03-01 (×12): 325 mg via ORAL
  Filled 2019-02-18 (×13): qty 1

## 2019-02-18 MED ORDER — THIAMINE HCL 100 MG PO TABS
100.0000 mg | ORAL_TABLET | Freq: Every day | ORAL | Status: DC
  Administered 2019-02-18 – 2019-02-26 (×9): 100 mg via ORAL
  Filled 2019-02-18 (×9): qty 1

## 2019-02-18 MED ORDER — FOLIC ACID 1 MG PO TABS
1.0000 mg | ORAL_TABLET | Freq: Every day | ORAL | Status: DC
  Administered 2019-02-18 – 2019-02-26 (×9): 1 mg via ORAL
  Filled 2019-02-18 (×9): qty 1

## 2019-02-18 MED ORDER — NICOTINE 21 MG/24HR TD PT24
21.0000 mg | MEDICATED_PATCH | Freq: Once | TRANSDERMAL | Status: AC
  Administered 2019-02-18: 21 mg via TRANSDERMAL
  Filled 2019-02-18: qty 1

## 2019-02-18 MED ORDER — METOPROLOL TARTRATE 25 MG PO TABS
25.0000 mg | ORAL_TABLET | Freq: Two times a day (BID) | ORAL | Status: DC
  Administered 2019-02-18 – 2019-03-01 (×21): 25 mg via ORAL
  Filled 2019-02-18 (×23): qty 1

## 2019-02-18 MED ORDER — ENSURE ENLIVE PO LIQD
237.0000 mL | Freq: Three times a day (TID) | ORAL | Status: DC
  Administered 2019-02-19 – 2019-02-26 (×8): 237 mL via ORAL

## 2019-02-18 MED ORDER — DIPHENHYDRAMINE HCL 25 MG PO CAPS
25.0000 mg | ORAL_CAPSULE | Freq: Every evening | ORAL | Status: DC | PRN
  Administered 2019-02-19 – 2019-02-27 (×3): 25 mg via ORAL
  Filled 2019-02-18 (×3): qty 1

## 2019-02-18 MED ORDER — ACETAMINOPHEN 500 MG PO TABS
500.0000 mg | ORAL_TABLET | Freq: Every evening | ORAL | Status: DC | PRN
  Administered 2019-02-25 – 2019-02-27 (×2): 500 mg via ORAL
  Filled 2019-02-18 (×2): qty 1

## 2019-02-18 MED ORDER — PANTOPRAZOLE SODIUM 40 MG PO TBEC
40.0000 mg | DELAYED_RELEASE_TABLET | Freq: Every day | ORAL | Status: DC
  Administered 2019-02-18 – 2019-03-01 (×12): 40 mg via ORAL
  Filled 2019-02-18 (×12): qty 1

## 2019-02-18 MED ORDER — SODIUM CHLORIDE 0.9 % IV BOLUS
500.0000 mL | Freq: Once | INTRAVENOUS | Status: AC
  Administered 2019-02-18: 500 mL via INTRAVENOUS

## 2019-02-18 MED ORDER — TAMSULOSIN HCL 0.4 MG PO CAPS
0.4000 mg | ORAL_CAPSULE | Freq: Every day | ORAL | Status: DC
  Administered 2019-02-18 – 2019-03-01 (×12): 0.4 mg via ORAL
  Filled 2019-02-18 (×12): qty 1

## 2019-02-18 MED ORDER — LOPERAMIDE HCL 2 MG PO CAPS: 2.0000 mg | ORAL_CAPSULE | ORAL | Status: DC | PRN

## 2019-02-18 MED ORDER — DIPHENHYDRAMINE-APAP (SLEEP) 25-500 MG PO TABS: 1.0000 | ORAL_TABLET | Freq: Every evening | ORAL | Status: DC | PRN

## 2019-02-18 NOTE — ED Notes (Signed)
PT at bedside.

## 2019-02-18 NOTE — Evaluation (Signed)
Physical Therapy Evaluation Patient Details Name: Daniel Edwards MRN: [id number] DOB: Apr 14, 1934 Today's Date: 02/18/2019   History of Present Illness  Per MD notes: Pt is an 85 y.o. male with a history of BPH, COPD, hypertension, GERD who presents to the ED for altered mental status and weakness.  EMS reports he has been having recurrent episodes of altered mental status recently.  He apparently called his son today and did not say anything.  He was 84% on room air then placed on 4 L nasal cannula.  He was noted to have A. fib.  MD assessment includes AMS and tachycardia.    Clinical Impression  Pt presented with deficits in strength, transfers, mobility, gait, balance, and activity tolerance.  Pt with noted decrease in alertness/orientation as well as with functional mobility compared to recent prior admission.  Pt alert to self only and was anxious and somewhat tremulous requiring frequent encouragement to participate during the session.  Pt was able to come to sitting with extensive assist and initially required occasional min-mod A for static sitting balance but progressed to only requiring close SBA.  Pt unable to perform sit to stand or lateral scoot transfers.  Pt will benefit from PT services in a SNF setting upon discharge to safely address above deficits for decreased caregiver assistance and eventual return to PLOF.      Follow Up Recommendations SNF    Equipment Recommendations  None recommended by PT    Recommendations for Other Services       Precautions / Restrictions Precautions Precautions: Fall Restrictions Weight Bearing Restrictions: No      Mobility  Bed Mobility Overal bed mobility: Needs Assistance Bed Mobility: Supine to Sit;Sit to Supine;Rolling Rolling: Max assist   Supine to sit: Max assist Sit to supine: Max assist   General bed mobility comments: Pt anxious with bed mobility tasks; pt provided some effort but ultimately required extensive  assistance  Transfers                 General transfer comment: Multiple attempts made at sit to stand and lateral scoot transfers with pt unable to perform either  Ambulation/Gait             General Gait Details: Unable/unsafe to attempt  Stairs            Wheelchair Mobility    Modified Rankin (Stroke Patients Only)       Balance Overall balance assessment: Needs assistance Sitting-balance support: Feet supported;Bilateral upper extremity supported Sitting balance-Leahy Scale: Poor Sitting balance - Comments: Occasional min to mod A required to prevent posterior LOB in sitting Postural control: Posterior lean     Standing balance comment: Unable to stand                             Pertinent Vitals/Pain Pain Assessment: No/denies pain    Home Living Family/patient expects to be discharged to:: Private residence Living Arrangements: Alone Available Help at Discharge: Family;Available PRN/intermittently Type of Home: House Home Access: Level entry     Home Layout: One level Home Equipment: Walker - 2 wheels;Cane - single point Additional Comments: Pt unable to provide history secondary to AMS/confusion, history obtained from recent prior admission.    Prior Function Level of Independence: Independent         Comments: Ind Amb without an AD, one fall in the last 6 months, Ind with ADLs.  Per patient son is  looking into ALF for patient.     Hand Dominance        Extremity/Trunk Assessment   Upper Extremity Assessment Upper Extremity Assessment: Generalized weakness;Difficult to assess due to impaired cognition    Lower Extremity Assessment Lower Extremity Assessment: Generalized weakness;Difficult to assess due to impaired cognition       Communication      Cognition Arousal/Alertness: Awake/alert Behavior During Therapy: Anxious;Restless Overall Cognitive Status: Impaired/Different from baseline Area of Impairment:  Orientation                 Orientation Level: Disoriented to;Place;Time;Situation                    General Comments      Exercises Total Joint Exercises Ankle Circles/Pumps: AAROM;Both;10 reps;15 reps Quad Sets: AAROM;Both;10 reps Short Arc QuadSinclair Ship;Both;10 reps Heel Slides: AAROM;Both;10 reps Hip ABduction/ADduction: AAROM;Both;10 reps Straight Leg Raises: AAROM;Both;10 reps Long Arc Quad: AROM;Both;10 reps;Strengthening Knee Flexion: AROM;Both;10 reps;Strengthening Other Exercises Other Exercises: Static sitting with anterior weight shifting to address posterior instability   Assessment/Plan    PT Assessment Patient needs continued PT services  PT Problem List Decreased strength;Decreased activity tolerance;Decreased balance;Decreased mobility;Decreased knowledge of use of DME       PT Treatment Interventions DME instruction;Gait training;Functional mobility training;Therapeutic activities;Therapeutic exercise;Balance training;Patient/family education    PT Goals (Current goals can be found in the Care Plan section)  Acute Rehab PT Goals PT Goal Formulation: Patient unable to participate in goal setting Time For Goal Achievement: 03/03/19 Potential to Achieve Goals: Fair    Frequency Min 2X/week   Barriers to discharge Inaccessible home environment;Decreased caregiver support      Co-evaluation               AM-PAC PT "6 Clicks" Mobility  Outcome Measure Help needed turning from your back to your side while in a flat bed without using bedrails?: A Lot Help needed moving from lying on your back to sitting on the side of a flat bed without using bedrails?: A Lot Help needed moving to and from a bed to a chair (including a wheelchair)?: Total Help needed standing up from a chair using your arms (e.g., wheelchair or bedside chair)?: Total Help needed to walk in hospital room?: Total Help needed climbing 3-5 steps with a railing? : Total 6  Click Score: 8    End of Session Equipment Utilized During Treatment: Gait belt;Oxygen Activity Tolerance: Patient tolerated treatment well Patient left: in bed;with call bell/phone within reach Nurse Communication: Mobility status PT Visit Diagnosis: Unsteadiness on feet (R26.81);Muscle weakness (generalized) (M62.81);Difficulty in walking, not elsewhere classified (R26.2)    Time: 1635-1710 PT Time Calculation (min) (ACUTE ONLY): 35 min   Charges:   PT Evaluation $PT Eval Moderate Complexity: 1 Mod PT Treatments $Therapeutic Exercise: 8-22 mins        D. Royetta Asal PT, DPT 02/18/19, 5:35 PM

## 2019-02-18 NOTE — ED Notes (Signed)
Pt placed in clean brief at this time.

## 2019-02-18 NOTE — ED Notes (Signed)
Pt satting at 98% on room air.

## 2019-02-18 NOTE — ED Triage Notes (Addendum)
Pt comes via [facility] from home with c/o AMS and weakness. EMS reports pt has been having recurring episodes of increased AMS recently. Pt apparently called son today and didn't say anything. Pt's son went to check on him and called 911.  EMS reports pt was 84% RA and then placed on 4L [place] and now at 99%. EMS also reports pt was afib on the monitor and PVCs. Pt has no hx of this. EMS gave 1 duoneb in route.  Pt is alert and oriented at this time. Pt denies any pain or other complaints.

## 2019-02-18 NOTE — ED Notes (Signed)
Pt given couple bites of applesauce

## 2019-02-18 NOTE — ED Notes (Signed)
Pt given meal tray.

## 2019-02-18 NOTE — ED Notes (Signed)
brought pt his dinner tray.  Assisted pt with eating and he spit out the mashed potatoes and meatloaf.  States he just doesn't like it.  Pt drinking juice at this time

## 2019-02-18 NOTE — ED Notes (Addendum)
Pt sitting up resting at this time

## 2019-02-18 NOTE — ED Provider Notes (Signed)
[facility] Emergency Department Provider Note       Time seen: ----------------------------------------- 1:13 PM on 02/18/2019 ----------------------------------------- Level V caveat: History/ROS limited by altered mental status  I have reviewed the triage vital signs and the nursing notes.  HISTORY   Chief Complaint Weakness and Altered Mental Status    HPI Daniel Edwards is a 85 y.o. male with a history of BPH, COPD, hypertension, GERD who presents to the ED for altered mental status and weakness.  EMS reports he has been having recurrent episodes of altered mental status recently.  He apparently called his son today and did not say anything.  He was 84% on room air then placed on 4 L nasal cannula.  He was noted to have A. fib with no known history of this.  Past Medical History:  Diagnosis Date  . BPH (benign prostatic hyperplasia)   . COPD (chronic obstructive pulmonary disease) ([place])   . Erectile dysfunction   . Essential hypertension   . GERD (gastroesophageal reflux disease)   . Simple chronic bronchitis [facility])     Patient Active Problem List   Diagnosis Date Noted  . Protein-calorie malnutrition, severe 01/27/2019  . Hypokalemia 01/24/2019  . Alcoholism ([place]) 01/24/2019  . ARF (acute renal failure) ([place]) 01/24/2019  . Weakness generalized 01/24/2019  . FTT (failure to thrive) in adult 01/24/2019  . Rhabdomyolysis 11/15/2018  . A-fib ([place]) 07/20/2015  . Chest pain 02/19/2015  . Atypical chest pain 02/19/2015    Past Surgical History:  Procedure Laterality Date  . INGUINAL HERNIA REPAIR      Allergies Patient has no known allergies.  Social History Social History   Tobacco Use  . Smoking status: Current Every Day Smoker    Packs/day: 1.00    Types: Cigarettes  . Smokeless tobacco: Never Used  Substance Use Topics  . Alcohol use: Yes    Alcohol/week: 0.0 standard drinks  . Drug use: Not on file    Review of  Systems Unknown, reported altered mental status  All systems negative/normal/unremarkable except as stated in the HPI  ____________________________________________   PHYSICAL EXAM:  VITAL SIGNS: ED Triage Vitals  Enc Vitals Group     BP      Pulse      Resp      Temp      Temp src      SpO2      Weight      Height      Head Circumference      [facility] Flow      Pain Score      Pain Loc      Pain Edu?      Excl. in [place]?     Constitutional: Alert but disoriented.  Well appearing and in no distress. Eyes: Conjunctivae are normal. Normal extraocular movements. ENT      Head: Normocephalic and atraumatic.      Nose: No congestion/rhinnorhea.      Mouth/Throat: Mucous membranes are moist.      Neck: No stridor. Cardiovascular: Rapid rate, irregular rhythm. No murmurs, rubs, or gallops. Respiratory: Normal respiratory effort without tachypnea nor retractions. Breath sounds are clear and equal bilaterally. No wheezes/rales/rhonchi. Gastrointestinal: Soft and nontender. Normal bowel sounds Musculoskeletal: Nontender with normal range of motion in extremities. No lower extremity tenderness nor edema. Neurologic:  Normal speech and language. No gross focal neurologic deficits are appreciated.  Skin:  Skin is warm, dry and intact. No rash noted. Psychiatric: Mood  and affect are normal. Speech and behavior are normal.  ____________________________________________  EKG: Interpreted by me.  Sinus tachycardia with PVCs, incomplete right bundle branch block, RVH, septal infarct.  Consider multifocal atrial tachycardia  ____________________________________________  ED COURSE:  As part of my medical decision making, I reviewed the following data within the [place] History obtained from family if available, nursing notes, old chart and ekg, as well as notes from prior ED visits. Patient presented for altered mental status, we will assess with labs and imaging as  indicated at this time.   Procedures  Daniel Edwards was evaluated in Emergency Department on 02/18/2019 for the symptoms described in the history of present illness. He was evaluated in the context of the global COVID-19 pandemic, which necessitated consideration that the patient might be at risk for infection with the SARS-CoV-2 virus that causes COVID-19. Institutional protocols and algorithms that pertain to the evaluation of patients at risk for COVID-19 are in a state of rapid change based on information released by regulatory bodies including the CDC and federal and state organizations. These policies and algorithms were followed during the patient's care in the ED.  ____________________________________________   LABS (pertinent positives/negatives)  Labs Reviewed  CBC WITH DIFFERENTIAL/PLATELET - Abnormal; Notable for the following components:      Result Value   WBC 13.9 (*)    Hemoglobin 12.8 (*)    HCT 37.3 (*)    Neutro Abs 12.5 (*)    Abs Immature Granulocytes 0.11 (*)    All other components within normal limits  COMPREHENSIVE METABOLIC PANEL - Abnormal; Notable for the following components:   Chloride 96 (*)    Glucose, Bld 165 (*)    BUN 34 (*)    Creatinine, Ser 1.70 (*)    Calcium 7.0 (*)    GFR calc non Af Amer 37 (*)    GFR calc Af Amer 43 (*)    Anion gap 16 (*)    All other components within normal limits  TROPONIN I (HIGH SENSITIVITY) - Abnormal; Notable for the following components:   Troponin I (High Sensitivity) 36 (*)    All other components within normal limits  URINALYSIS, COMPLETE (UACMP) WITH MICROSCOPIC  CBG MONITORING, ED    RADIOLOGY Images were viewed by me  Chest x-ray, CT head  IMPRESSION:  No active disease.   IMPRESSION:  1. No acute intracranial findings.   2. Chronic microvascular ischemic change and cerebral volume loss.  ____________________________________________   DIFFERENTIAL DIAGNOSIS   CVA, TIA, sepsis, arrhythmia,  MI, COVID-19, dementia  FINAL ASSESSMENT AND PLAN  Altered mental status, tachycardia   Plan: The patient had presented for altered mental status. Patient's labs did reveal some acute kidney injury which will be treated with fluids.  Troponin elevation is likely secondary to same.  Patient's imaging has not revealed any acute process.  Family states he needs skilled nursing facility placement because he lives by himself.  He still drinks alcohol.  I will consult social work, physical therapy.   Laurence Aly, MD    Note: This note was generated in part or whole with voice recognition software. Voice recognition is usually quite accurate but there are transcription errors that can and very often do occur. I apologize for any typographical errors that were not detected and corrected.     Earleen Newport, MD 02/18/19 [phone]

## 2019-02-18 NOTE — ED Notes (Signed)
Pt's son, [name], updated on pt status.

## 2019-02-18 NOTE — ED Notes (Signed)
Pt cleaned up and clean linens placed on bed. Pt pulled up in bed and repositioned at this time. Pt resting

## 2019-02-19 LAB — BASIC METABOLIC PANEL
Anion gap: 13 (ref 5–15)
BUN: 34 mg/dL — ABNORMAL HIGH (ref 8–23)
CO2: 24 mmol/L (ref 22–32)
Calcium: 6.5 mg/dL — ABNORMAL LOW (ref 8.9–10.3)
Chloride: 102 mmol/L (ref 98–111)
Creatinine, Ser: 1.22 mg/dL (ref 0.61–1.24)
GFR calc Af Amer: >60 mL/min (ref >60)
GFR calc non Af Amer: 55 mL/min — ABNORMAL LOW (ref >60)
Glucose, Bld: 69 mg/dL — ABNORMAL LOW (ref 70–99)
Potassium: 3.3 mmol/L — ABNORMAL LOW (ref 3.5–5.1)
Sodium: 139 mmol/L (ref 135–145)

## 2019-02-19 LAB — TROPONIN I (HIGH SENSITIVITY): Troponin I (High Sensitivity): 39 ng/L — ABNORMAL HIGH (ref <18)

## 2019-02-19 LAB — SARS CORONAVIRUS 2 (TAT 6-24 HRS): SARS Coronavirus 2: NEGATIVE

## 2019-02-19 MED ORDER — SODIUM CHLORIDE 0.9 % IV SOLN
1.0000 g | Freq: Once | INTRAVENOUS | Status: AC
  Administered 2019-02-19: 1 g via INTRAVENOUS
  Filled 2019-02-19: qty 10

## 2019-02-19 MED ORDER — CALCIUM CARBONATE ANTACID 500 MG PO CHEW
1.0000 | CHEWABLE_TABLET | Freq: Two times a day (BID) | ORAL | Status: DC
  Administered 2019-02-21 – 2019-03-01 (×14): 200 mg via ORAL
  Filled 2019-02-19 (×17): qty 1

## 2019-02-19 NOTE — ED Notes (Signed)
Pt confused and trying to get out of bed.  afib on monitor.

## 2019-02-19 NOTE — NC FL2 (Signed)
[place] LEVEL OF CARE SCREENING TOOL     IDENTIFICATION  Patient Name: Daniel Edwards Birthdate: September 10, 1934 Sex: male Admission Date (Current Location): 02/18/2019  [place] and [place] Number:  Engineering geologist and Address:  [facility], [address], [place], [place] [address]      Provider Number: [id number]  Attending Physician Name and Address:  No att. providers found  Relative Name and Phone Number:  Burnell Haniff [phone]    Current Level of Care: Hospital Recommended Level of Care: [place] Prior Approval Number:    Date Approved/Denied:   PASRR Number: [id number]  Discharge Plan: SNF    Current Diagnoses: Patient Active Problem List   Diagnosis Date Noted  . Protein-calorie malnutrition, severe 01/27/2019  . Hypokalemia 01/24/2019  . Alcoholism ([place]) 01/24/2019  . ARF (acute renal failure) ([place]) 01/24/2019  . Weakness generalized 01/24/2019  . FTT (failure to thrive) in adult 01/24/2019  . Rhabdomyolysis 11/15/2018  . A-fib ([place]) 07/20/2015  . Chest pain 02/19/2015  . Atypical chest pain 02/19/2015    Orientation RESPIRATION BLADDER Height & Weight     Self  Normal Incontinent Weight: 56.8 kg Height:  5\' 10"  (177.8 cm)  BEHAVIORAL SYMPTOMS/MOOD NEUROLOGICAL BOWEL NUTRITION STATUS        Diet(Regular)  AMBULATORY STATUS COMMUNICATION OF NEEDS Skin   Limited Assist Verbally Normal                       Personal Care Assistance Level of Assistance  Dressing, Feeding, Bathing Bathing Assistance: Maximum assistance Feeding assistance: Maximum assistance Dressing Assistance: Maximum assistance     Functional Limitations Info    Sight Info: Adequate        SPECIAL CARE FACTORS FREQUENCY  PT (By licensed PT), OT (By licensed OT)                    Contractures Contractures Info: Not present    Additional Factors Info  Code Status Code Status Info: Full              Current Medications (02/19/2019):  This is the current hospital active medication list Current Facility-Administered Medications  Medication Dose Route Frequency Provider Last Rate Last Admin  . diphenhydrAMINE (BENADRYL) capsule 25 mg  25 mg Oral QHS PRN Earleen Newport, MD       And  . acetaminophen (TYLENOL) tablet 500 mg  500 mg Oral QHS PRN Earleen Newport, MD      . aspirin EC tablet 325 mg  325 mg Oral Daily Earleen Newport, MD   325 mg at 02/19/19 0924  . feeding supplement (ENSURE ENLIVE) (ENSURE ENLIVE) liquid 237 mL  237 mL Oral TID BM Earleen Newport, MD   Stopped at 02/19/19 1143  . folic acid (FOLVITE) tablet 1 mg  1 mg Oral Daily Earleen Newport, MD   1 mg at 02/19/19 0926  . loperamide (IMODIUM) capsule 2 mg  2 mg Oral PRN Earleen Newport, MD      . magnesium oxide (MAG-OX) tablet 400 mg  400 mg Oral BID Earleen Newport, MD   400 mg at 02/19/19 [id number]  . metoprolol tartrate (LOPRESSOR) tablet 25 mg  25 mg Oral BID Earleen Newport, MD   25 mg at 02/19/19 0924  . multivitamin with minerals tablet 1 tablet  1 tablet Oral Daily Earleen Newport, MD   1 tablet at  02/19/19 0929  . nicotine (NICODERM CQ - dosed in mg/24 hours) patch 21 mg  21 mg Transdermal Once Harvest Dark, MD   21 mg at 02/18/19 2345  . pantoprazole (PROTONIX) EC tablet 40 mg  40 mg Oral Daily Earleen Newport, MD   40 mg at 02/19/19 0925  . tamsulosin (FLOMAX) capsule 0.4 mg  0.4 mg Oral Daily Earleen Newport, MD   0.4 mg at 02/19/19 0925  . thiamine tablet 100 mg  100 mg Oral Daily Earleen Newport, MD   100 mg at 02/19/19 [id number]   Current Outpatient Medications  Medication Sig Dispense Refill  . aspirin 325 MG tablet Take 325 mg by mouth daily.    . diphenhydramine-acetaminophen (TYLENOL PM) 25-500 MG TABS tablet Take 1 tablet by mouth at bedtime as needed (pain or sleep).    . feeding supplement, ENSURE ENLIVE, (ENSURE ENLIVE) LIQD Take 237 mLs  by mouth 3 (three) times daily between meals. [id number] mL 12  . folic acid (FOLVITE) 1 MG tablet Take 1 tablet (1 mg total) by mouth daily.    Marland Kitchen loperamide (IMODIUM) 2 MG capsule Take 1 capsule (2 mg total) by mouth as needed for diarrhea or loose stools. 30 capsule 0  . magnesium oxide (MAG-OX) 400 (241.3 Mg) MG tablet Take 1 tablet (400 mg total) by mouth 2 (two) times daily.    . metoprolol tartrate (LOPRESSOR) 25 MG tablet Take 1 tablet (25 mg total) by mouth 2 (two) times daily. (Patient taking differently: Take 37.5 mg by mouth 2 (two) times daily. ) 60 tablet 0  . Multiple Vitamin (MULTIVITAMIN WITH MINERALS) TABS tablet Take 1 tablet by mouth daily.    Marland Kitchen omeprazole (PRILOSEC) 20 MG capsule Take 20 mg by mouth daily.     . tamsulosin (FLOMAX) 0.4 MG CAPS capsule Take 1 capsule (0.4 mg total) by mouth daily. 30 capsule 0  . thiamine 100 MG tablet Take 1 tablet (100 mg total) by mouth daily.       Discharge Medications: Please see discharge summary for a list of discharge medications.  Relevant Imaging Results:  Relevant Lab Results:   Additional Information    Shelbie Ammons, RN

## 2019-02-19 NOTE — ED Notes (Signed)
Pt assisted to eat lunch. Given 2 cups of apple juice.

## 2019-02-19 NOTE — ED Notes (Signed)
Called dietary to get Ensure's sent to ER for pt to drink.

## 2019-02-19 NOTE — ED Notes (Signed)
Pt resting quietly now.  Sitter with pt.

## 2019-02-19 NOTE — ED Notes (Signed)
Pts depends wet upon assessment and pt changed.

## 2019-02-19 NOTE — Progress Notes (Signed)
Physical Therapy Treatment Patient Details Name: Daniel Edwards MRN: [id number] DOB: 1934/09/13 Today's Date: 02/19/2019    History of Present Illness Per MD notes: Pt is an 85 y.o. male with a history of BPH, COPD, hypertension, GERD who presents to the ED for altered mental status and weakness.  EMS reports he has been having recurrent episodes of altered mental status recently.  He apparently called his son today and did not say anything.  He was 84% on room air then placed on 4 L nasal cannula.  He was noted to have A. fib.  MD assessment includes AMS and tachycardia.    PT Comments    Pt presented with deficits in strength, transfers, mobility, gait, balance, and activity tolerance.  Pt remained anxious and somewhat confused but grossly improved from prior session.  Pt actively participated throughout the session with extra time to process commands and cuing for proper technique.  Pt remained unsteady in sitting but made good effort with transfers even though he was unable to stand or laterally scoot.  Pt will benefit from PT services in a SNF setting upon discharge to safely address above deficits for decreased caregiver assistance and eventual return to PLOF.      Follow Up Recommendations  SNF     Equipment Recommendations  None recommended by PT    Recommendations for Other Services       Precautions / Restrictions Precautions Precautions: Fall Restrictions Weight Bearing Restrictions: No    Mobility  Bed Mobility Overal bed mobility: Needs Assistance Bed Mobility: Supine to Sit;Sit to Supine;Rolling Rolling: Mod assist   Supine to sit: Mod assist Sit to supine: Mod assist   General bed mobility comments: Pt anxious with bed mobility tasks; pt with increased effort but ultimately required mod assistance for all bed mobility tasks  Transfers                 General transfer comment: Multiple attempts made at sit to stand and lateral scoot transfers with pt  unable to perform either  Ambulation/Gait             General Gait Details: Unable/unsafe to attempt   Stairs             Wheelchair Mobility    Modified Rankin (Stroke Patients Only)       Balance Overall balance assessment: Needs assistance Sitting-balance support: Feet supported;Bilateral upper extremity supported Sitting balance-Leahy Scale: Poor Sitting balance - Comments: Occasional min A required to prevent posterior LOB in sitting Postural control: Posterior lean     Standing balance comment: Unable to stand                            Cognition Arousal/Alertness: Awake/alert Behavior During Therapy: Anxious;Restless Overall Cognitive Status: Impaired/Different from baseline Area of Impairment: Orientation                 Orientation Level: Disoriented to;Place;Time;Situation                    Exercises Total Joint Exercises Ankle Circles/Pumps: AAROM;Both;10 reps;15 reps Quad Sets: AAROM;Both;10 reps Short Arc QuadSinclair Ship;Both;10 reps;AROM;15 reps Heel Slides: AAROM;Both;10 reps;AROM;15 reps Hip ABduction/ADduction: AAROM;Both;10 reps;AROM;15 reps Straight Leg Raises: AAROM;Both;10 reps;AROM;15 reps Long Arc Quad: AROM;Both;10 reps;Strengthening;15 reps Knee Flexion: AROM;Both;10 reps;Strengthening;15 reps Other Exercises Other Exercises: Static sitting with anterior weight shifting to address posterior instability    General Comments  Pertinent Vitals/Pain Pain Assessment: No/denies pain    Home Living                      Prior Function            PT Goals (current goals can now be found in the care plan section) Progress towards PT goals: Progressing toward goals    Frequency    Min 2X/week      PT Plan Current plan remains appropriate    Co-evaluation              AM-PAC PT "6 Clicks" Mobility   Outcome Measure  Help needed turning from your back to your side while in  a flat bed without using bedrails?: A Lot Help needed moving from lying on your back to sitting on the side of a flat bed without using bedrails?: A Lot Help needed moving to and from a bed to a chair (including a wheelchair)?: Total Help needed standing up from a chair using your arms (e.g., wheelchair or bedside chair)?: Total Help needed to walk in hospital room?: Total Help needed climbing 3-5 steps with a railing? : Total 6 Click Score: 8    End of Session Equipment Utilized During Treatment: Gait belt Activity Tolerance: Patient tolerated treatment well Patient left: in bed;with call bell/phone within reach Nurse Communication: Mobility status PT Visit Diagnosis: Unsteadiness on feet (R26.81);Muscle weakness (generalized) (M62.81);Difficulty in walking, not elsewhere classified (R26.2)     Time: [id number] PT Time Calculation (min) (ACUTE ONLY): 25 min  Charges:  $Therapeutic Exercise: 8-22 mins $Therapeutic Activity: 8-22 mins                     D. Scott Randle Shatzer PT, DPT 02/19/19, 4:55 PM

## 2019-02-19 NOTE — ED Notes (Signed)
Pt confused.  Pt waiting on placement.  Will continue to monitor pt's condition.

## 2019-02-19 NOTE — ED Notes (Signed)
This nurse spoke with brad, patient's son via phone and gave an update.

## 2019-02-19 NOTE — TOC Initial Note (Signed)
Transition of Care [facility]) - Initial/Assessment Note    Patient Details  Name: Daniel Edwards MRN: [id number] Date of Birth: 1934/09/04  [facility] CM/SW Contact:    Shelbie Ammons, RN Phone Number: 02/19/2019, 1:25 PM  Clinical Narrative:   RNCM spoke with patient who requested conversation be taken care of with son. Placed call to son [name] who reports patient needs placement in SNF. [name] reports that patient did well for a very short time after leaving [facility] and very quickly was having the same issues as he was having before. Patient was brought to ED for AMS. Son reports that he believe patient will likely need long term placement. Did discuss at this time that PT is recommending SNF for which he is agreeable.   FL2 completed, PASSR verified and bed search initiated.     Expected Discharge Plan: Skilled Nursing Facility Barriers to Discharge: Barriers Resolved   Patient Goals and CMS Choice     Choice offered to / list presented to : Patient, Adult Children  Expected Discharge Plan and Services Expected Discharge Plan: [place]   Discharge Planning Services: CM Consult Post Acute Care Choice: [place] Living arrangements for the past 2 months: Single Family Home                                      Prior Living Arrangements/Services Living arrangements for the past 2 months: Single Family Home Lives with:: Self Patient language and need for interpreter reviewed:: Yes Do you feel safe going back to the place where you live?: Yes      Need for Family Participation in Patient Care: Yes (Comment) Care giver support system in place?: Yes (comment)   Criminal Activity/Legal Involvement Pertinent to Current Situation/Hospitalization: No - Comment as needed  Activities of Daily Living      Permission Sought/Granted Permission sought to share information with : Family Supports    Share Information with NAME:  Dayton Martes           Emotional Assessment Appearance:: Appears stated age Attitude/Demeanor/Rapport: Engaged   Orientation: : Oriented to Self   Psych Involvement: No (comment)  Admission diagnosis:  altered mental status ems Patient Active Problem List   Diagnosis Date Noted  . Protein-calorie malnutrition, severe 01/27/2019  . Hypokalemia 01/24/2019  . Alcoholism ([place]) 01/24/2019  . ARF (acute renal failure) ([place]) 01/24/2019  . Weakness generalized 01/24/2019  . FTT (failure to thrive) in adult 01/24/2019  . Rhabdomyolysis 11/15/2018  . A-fib ([place]) 07/20/2015  . Chest pain 02/19/2015  . Atypical chest pain 02/19/2015   PCP:  Baxter Hire, MD Pharmacy:   [facility] [place] [address] Phone: [phone] Fax: [phone]  [id number] [place], [place] - [address] [place] [address] Phone: [phone] Fax: [phone]     Social Determinants of Health (SDOH) Interventions    Readmission Risk Interventions Readmission Risk Prevention Plan 11/18/2018  Transportation Screening Complete  Some recent data might be hidden

## 2019-02-19 NOTE — ED Notes (Signed)
Pt ate dinner tray.

## 2019-02-19 NOTE — ED Notes (Signed)
Pt calling out. RN to bedside and pt requesting to have the nurse "shut the cabinet." RN attempted to reorient pt but pt continued to ask questions that did not make sense. Pt requesting RN to get things from places in his house but when RN stated that pt was in the hospital pt agreed and stated that he knew he was in the hospital.   Pt pulled off condom cath and bed sheets noted to be wet with urine. Pt assisted RN with rolling while depends and bed sheets were changed. Pt given paper towels as well per pt request. Lights dimmed but blinds remain open. Call bell in reach and bed locked and in lowest position. Pt in NAD at this time.

## 2019-02-19 NOTE — ED Notes (Signed)
Sitter with pt.  afib on monitor.

## 2019-02-19 NOTE — ED Notes (Signed)
Resumed care from [place] rn.  Pt confused.  afib on monitor.  Iv in place.

## 2019-02-20 NOTE — ED Notes (Signed)
Spoke to son [name] and updated on continued plan of care for his father.

## 2019-02-20 NOTE — ED Notes (Signed)
Pt provided cup of coffee per request.

## 2019-02-20 NOTE — ED Notes (Signed)
Pt assisted by this RN and Lorrie, RN to bedside commode. Pt requiring 2 person assist to ambulate to bathroom. Pt had small bowel movement. Peri-care performed by this RN. Pt assisted back to bed.

## 2019-02-20 NOTE — ED Notes (Signed)
Pt offered breakfast tray, declines at this time.

## 2019-02-20 NOTE — ED Notes (Signed)
Pt switched to low bed due to fall risk. Pt given meal tray and coffee to drink at this time. Pt disoriented to situation at this time. Pt thinking he is at his home. Pt easily redirected by this RN.

## 2019-02-20 NOTE — ED Notes (Signed)
Patient is resting

## 2019-02-20 NOTE — ED Notes (Signed)
Pt restless in bed, benadryl given. Pt throwing legs over side of bed, pt has been unable to get himself out of bed. Pt able to be visualized from nursing station.

## 2019-02-20 NOTE — ED Notes (Signed)
Pt given water to drink. 

## 2019-02-20 NOTE — ED Notes (Signed)
Patient provided dinner tray. Patient repositioned in bed and head of bed elevated so patient can eat.

## 2019-02-20 NOTE — ED Notes (Signed)
Sitter with pt.  Pt yells out at times.  afib on monitor.

## 2019-02-20 NOTE — ED Notes (Signed)
Pt asleep at this time. No apparent distress

## 2019-02-20 NOTE — Care Management (Addendum)
RN CM: Call from [place] [facility] states patient is not able to return to [facility] due to need for LT but not able to qualify for Medicaid due to selling land within 3-5 years. Son is not able to financially assist with cost of care.   RN CM: Call to Leory Plowman, son, to update on financial concerns for LTC. Advised patient would not qualify for Medicaid due to money in savings. Son states they will plan to use that money for SNF copays and plan to apply for Medicaid once that is utilized. Erlene Quan, son, plans to visit DSS tomorrow and begin assistance with Medicaid approval.

## 2019-02-20 NOTE — ED Notes (Signed)
Pt sleeping now , no sitter with pt

## 2019-02-20 NOTE — ED Notes (Signed)
1:1 safety sitter

## 2019-02-20 NOTE — ED Notes (Signed)
Pt resting comfortably in bed at this time

## 2019-02-20 NOTE — ED Notes (Signed)
Pt sleeping. 

## 2019-02-20 NOTE — ED Notes (Signed)
Pt continually hitting call light and asking for "45 watt lightbulb". This RN attempted to reorient patient with no success. Pt able to say he is in the hospital, but continually asking for items from his house. Bed alarm in place at this time.

## 2019-02-20 NOTE — ED Notes (Signed)
Pt sleeping   afib on monitor.

## 2019-02-21 NOTE — Progress Notes (Signed)
Physical Therapy Treatment Patient Details Name: Daniel Edwards MRN: [id number] DOB: [birth year] Today's Date: 02/21/2019    History of Present Illness 85 y.o. male with a history of BPH, COPD, hypertension, GERD who presents to the ED for altered mental status and weakness.  EMS reports he has been having recurrent episodes of altered mental status recently.  He apparently called his son today and did not say anything.  He was 83% on room air then placed on 4 L nasal cannula.  He was noted to have A. fib.  MD assessment includes AMS and tachycardia.    PT Comments    Pt is more clear of mind and much more stable with sitting EOB, but continues to show considerable unsteadiness in standing and though he was able to take a small steps with each foot they were ataxic, unsteady and he was highly reliant on the walker just to shuffle back a few inches to the bed.  Pt did relatively well with seated LE exercises but remains frustrated with his limitations and overall situation.    Follow Up Recommendations  SNF     Equipment Recommendations       Recommendations for Other Services       Precautions / Restrictions Precautions Precautions: Fall Restrictions Weight Bearing Restrictions: No    Mobility  Bed Mobility Overal bed mobility: Needs Assistance Bed Mobility: Supine to Sit;Sit to Supine;Rolling Rolling: Min guard   Supine to sit: Min assist Sit to supine: Min assist      Transfers Overall transfer level: Needs assistance Equipment used: Rolling walker (2 wheeled) Transfers: Sit to/from Stand Sit to Stand: Min assist         General transfer comment: Multiple attempts made at sit to stand with lowest bed setting, with cuing for appropriate positioning and sequencing and with great effort he was able to rise from slightly elevated surface  Ambulation/Gait Ambulation/Gait assistance: Mod assist Gait Distance (Feet): 3 Feet Assistive device: Rolling walker (2 wheeled)       General Gait Details: Took a steps with each foot and pt was very ataxic with scissoring/inability to place feet confidently.  Pt looks at PT after this minimal effort and states "I don't think I can do this."    Stairs             Wheelchair Mobility    Modified Rankin (Stroke Patients Only)       Balance Overall balance assessment: Needs assistance Sitting-balance support: Feet supported;Bilateral upper extremity supported Sitting balance-Leahy Scale: Fair Sitting balance - Comments: Pt was able to sit at EOB w/o assist, much safer this session   Standing balance support: Bilateral upper extremity supported;During functional activity Standing balance-Leahy Scale: Poor Standing balance comment: able to stand statically with heavy UE use, unsteady and unsafe with dynamic effort.                            Cognition Arousal/Alertness: Awake/alert Behavior During Therapy: Anxious;Restless                                   General Comments: Pt much more clear of mind this date, though still with some confusion (baseline?)      Exercises General Exercises - Lower Extremity Ankle Circles/Pumps: AROM;15 reps Long Arc Quad: Strengthening;10 reps Heel Slides: Strengthening;10 reps Hip ABduction/ADduction: Strengthening;10 reps Hip Flexion/Marching: Strengthening;10 reps  General Comments        Pertinent Vitals/Pain Pain Assessment: No/denies pain    Home Living                      Prior Function            PT Goals (current goals can now be found in the care plan section) Progress towards PT goals: Progressing toward goals    Frequency    Min 2X/week      PT Plan Current plan remains appropriate    Co-evaluation              AM-PAC PT "6 Clicks" Mobility   Outcome Measure  Help needed turning from your back to your side while in a flat bed without using bedrails?: A Little Help needed moving from  lying on your back to sitting on the side of a flat bed without using bedrails?: A Little Help needed moving to and from a bed to a chair (including a wheelchair)?: A Lot Help needed standing up from a chair using your arms (e.g., wheelchair or bedside chair)?: A Lot Help needed to walk in hospital room?: Total Help needed climbing 3-5 steps with a railing? : Total 6 Click Score: 12    End of Session Equipment Utilized During Treatment: Gait belt Activity Tolerance: Patient tolerated treatment well Patient left: in bed;with call bell/phone within reach Nurse Communication: Mobility status PT Visit Diagnosis: Unsteadiness on feet (R26.81);Muscle weakness (generalized) (M62.81);Difficulty in walking, not elsewhere classified (R26.2)     Time: [id number] PT Time Calculation (min) (ACUTE ONLY): 34 min  Charges:  $Therapeutic Exercise: 8-22 mins $Therapeutic Activity: 8-22 mins                     Kreg Shropshire, DPT 02/21/2019, 2:00 PM

## 2019-02-21 NOTE — ED Notes (Signed)
Pt given lunch tray at this time. Pt sitting on edge of bed eating at this time.

## 2019-02-21 NOTE — ED Notes (Signed)
Pt changed into cleaned dry brief. Clean gown placed on pt. Clean chux placed on bed. Pt given warm blanket. PT still at bedside.

## 2019-02-21 NOTE — ED Notes (Signed)
Pt given ice.  

## 2019-02-21 NOTE — ED Notes (Signed)
Patient repositioned in bed and tv turned on per patient request.

## 2019-02-21 NOTE — ED Notes (Signed)
Patient resting in bed with eyes closed. Lights off for patient comfort

## 2019-02-21 NOTE — ED Notes (Signed)
Patient in bed with eyes closed

## 2019-02-21 NOTE — ED Notes (Signed)
Patient is resting in bed with eyes closed. Lights off for comfort. Call light in reach

## 2019-02-21 NOTE — TOC Progression Note (Signed)
Transition of Care [facility]) - Progression Note    Patient Details  Name: Daniel Edwards MRN: [id number] Date of Birth: [birth year]  Transition of Care [facility]) CM/SW [place], RN Phone Number: 02/21/2019, 1:28 PM  Clinical Narrative:    Incoming call from son confirms he is planning to utilize all patients finances to pay for care prior to medicaid being approved. Son is working with DSS to get process started and is planning SNF stay prior to LTC conversion. RN CM outreach to [place] [facility] with update. Otila Kluver is talking with corporate to determine bed availability.    Expected Discharge Plan: [place] Barriers to Discharge: Barriers Resolved  Expected Discharge Plan and Services Expected Discharge Plan: [place]   Discharge Planning Services: CM Consult Post Acute Care Choice: [place] Living arrangements for the past 2 months: Single Family Home(in SNF until 2 weeks ago)                                       Social Determinants of Health (SDOH) Interventions    Readmission Risk Interventions Readmission Risk Prevention Plan 11/18/2018  Transportation Screening Complete  Some recent data might be hidden

## 2019-02-21 NOTE — ED Notes (Signed)
PT at bedside.

## 2019-02-21 NOTE — ED Notes (Signed)
Pt's son Leory Plowman called and stated he has his dad's wallet and money. Will inform pt. Son also updated on plan of care and status update.

## 2019-02-21 NOTE — ED Notes (Signed)
Pt provided with with water and repositioned in bed.

## 2019-02-21 NOTE — ED Notes (Signed)
Patient was repositioned in bed by staff and changed into clean brief. Patient was confused when entering room but easily reoriented. Patient is again resting in bed

## 2019-02-21 NOTE — ED Notes (Signed)
Pt given breakfast tray

## 2019-02-21 NOTE — ED Notes (Signed)
Pt eating and watching TV at this time. Pt is alert and talking in complete sentences.

## 2019-02-21 NOTE — ED Notes (Signed)
Pt given fresh water. Bill RN placed yellow fall risk socks on pt per request. Pt resting comfortably in bed at this time. Pt watching TV. Pt informed I would bring breakfast by shortly.

## 2019-02-21 NOTE — ED Notes (Signed)
Pt watching TV and asked for a little more light in room. More light turned on. Pt alert and oriented at this time.

## 2019-02-22 NOTE — ED Notes (Signed)
Pt on the call bell, this RN to bedside assisted pt with light dimming and tv station changed

## 2019-02-22 NOTE — ED Notes (Signed)
Pt ringing call bell, inquiring when meal trays were to be delivered, pt reassured that it should be soon, and that I would deliver it as soon as it was brought to the dept

## 2019-02-22 NOTE — ED Notes (Signed)
Pt in hall bed talking on cell phone at this time.

## 2019-02-22 NOTE — ED Notes (Signed)
Bed alarm went off when patient was sitting on side of bed. Patient states he was looking for his gold ring. Ring was found in the blankets by Sarita Haver and placed in patient's blue plastic bag with his meds in it. Patient's cell phone is also in that bag. Patient was cooperative and asked for a cup of water. Patient refused to throw away his dinner containers that had oranges and chips still in them.

## 2019-02-22 NOTE — ED Notes (Signed)
Peri care done, clean depends applied. Pt positioned for comfort, no complaints at this time.

## 2019-02-22 NOTE — ED Notes (Signed)
Meal tray delivered to pt, pt requesting a cup of coffee with cream and sugar as well as a cup of ice.

## 2019-02-22 NOTE — ED Notes (Signed)
Report received from [facility]. Patient care assumed. Patient/RN introduction complete. Will continue to monitor. Pt resting comfortably with eyes closed, no distress noted and bed alarm in place.

## 2019-02-22 NOTE — ED Notes (Signed)
Pt sleeping, bed alarm on.

## 2019-02-22 NOTE — ED Notes (Signed)
Report given to Vanessa RN.

## 2019-02-22 NOTE — ED Notes (Signed)
Report received from [place], pt's call bell on this RN to bedside to assist patient. No distress noted at this time, cont to monitor

## 2019-02-22 NOTE — TOC Progression Note (Signed)
Transition of Care [facility]) - Progression Note    Patient Details  Name: Daniel Edwards MRN: [id number] Date of Birth: Sep 16, 1934  Transition [facility] Care [facility]) CM/SW Contact  Anselm Pancoast, RN Phone Number: 02/22/2019, 12:41 PM  Clinical Narrative:    Incoming call from son states he has left several messages with Kennyth Lose @ DSS for direction on Medicaid application. LVMM for [facility] following up on bed availability.    Expected Discharge Plan: [place] Barriers to Discharge: Barriers Resolved  Expected Discharge Plan and Services Expected Discharge Plan: [place]   Discharge Planning Services: CM Consult Post Acute Care Choice: [place] Living arrangements for the past 2 months: Single Family Home(in SNF until 2 weeks ago)                                       Social Determinants of Health (SDOH) Interventions    Readmission Risk Interventions Readmission Risk Prevention Plan 11/18/2018  Transportation Screening Complete  Some recent data might be hidden

## 2019-02-22 NOTE — ED Notes (Addendum)
Patient given dinner tray. Patient is alert, oriented to recent events. Patient is calm and cooperative.

## 2019-02-22 NOTE — ED Notes (Signed)
Pt assisted to the toilet, pt had an episode of loose stool, pt using walker and this RN's assistance, pt unsteady of his feet and sat self down hard onto the toilet. Pt assisted to be cleaned of loose stool and new depends placed on pt. Pt states he believes he needs to go back to [facility] for assistance with his ADL's

## 2019-02-22 NOTE — ED Notes (Signed)
Pt resting comfortably in bed watching tv at this time.

## 2019-02-23 NOTE — Social Work (Addendum)
TOC CM/SW updated son SNF bed search.  Discussed that search needs to be expanded outside of [facility]. No SNF beds available for this patient in [facility] area. Son gave SW permission to expand the search.   (Decline: [facility], [facility], [facility], [place], [place], [facility])  Berenice Bouton, MSW, LCSW  [phone] 8am-6:30pm (weekends)

## 2019-02-23 NOTE — ED Notes (Signed)
Pt given lunch tray, pt appears to be resting at this time

## 2019-02-23 NOTE — ED Notes (Signed)
Pt is awake and eating an orange. Pt asking for someone to check his BP. Pt asking for coffee. Pt given coffee. Pt also cleaned with wipes and new diaper applied. Sheets changed and new gown and blankets given.

## 2019-02-23 NOTE — ED Notes (Signed)
Pt ate 75% of dinner tray.

## 2019-02-23 NOTE — ED Notes (Signed)
Pt assisted to the bathroom by Zach ed tech, loose stool noted

## 2019-02-23 NOTE — ED Provider Notes (Signed)
-----------------------------------------   1:34 PM on 02/23/2019 -----------------------------------------   Blood pressure 136/63, pulse 92, temperature 98.4 F (36.9 C), temperature source Oral, resp. rate 18, height 5\' 10"  (1.778 m), weight 56.8 kg, SpO2 95 %.  The patient is calm and cooperative at this time.  There have been no acute events since the last update.  Awaiting disposition plan from Social Work team(s).    Merlyn Lot, MD 02/23/19 1334

## 2019-02-23 NOTE — ED Notes (Signed)
Pt resting quietly. No signs of distress.  

## 2019-02-23 NOTE — ED Notes (Signed)
Pt sleeping soundly. No distress at this time. Even respirations. Pt in a hallway bed in view of staff.

## 2019-02-23 NOTE — ED Notes (Signed)
Report received from [facility], pt sitting upright in bed, alert, no distress noted at this time, cont to monitor

## 2019-02-23 NOTE — ED Notes (Signed)
Pt is sleeping soundly. No signs distress.

## 2019-02-23 NOTE — ED Notes (Signed)
Pt sitting upright in his bed, finishing his dinner, no distress noted, states that he is doing fine, report given to Darl Householder RN

## 2019-02-23 NOTE — ED Notes (Signed)
Pt states he will need to take his medications this morning and laster will need to charge his phone so he can call his son. Pt continues to drink his coffee.

## 2019-02-23 NOTE — ED Notes (Signed)
Pt laying in bed quietly attempting to sleep. No signs of distress at this time.

## 2019-02-23 NOTE — Social Work (Addendum)
TOC CM/SW made contact with SNF - [facility]  Spoke to [employer] who reported that they are not able to take this patient.  Patient has outstanding bill w/[facility] and refuse to pay.  Will expand bed search.   Waiting for call from [facility].  Social updated son on bed search.   Berenice Bouton, MSW, LCSW  [phone] 8am-6pm (weekends)

## 2019-02-23 NOTE — ED Notes (Signed)
Pt assisted with his cell phone and charger

## 2019-02-24 NOTE — ED Notes (Signed)
Pt cleaned up, brief and linens changed.

## 2019-02-24 NOTE — ED Notes (Signed)
Pt given coffee to drink.

## 2019-02-24 NOTE — ED Notes (Signed)
PT given cup of ice per request.

## 2019-02-24 NOTE — ED Notes (Signed)
Report given to Mac, RN

## 2019-02-24 NOTE — ED Notes (Signed)
Pt brief soiled. Took pt in bed to bathroom when we tried to get up he stated his leg was hurting and did not feel comfortable getting up and walking. Changed pt in bed. New clean brief, pad, gown and blankets.

## 2019-02-24 NOTE — ED Notes (Signed)
Pt soiled his brief with urine. Brief, linens and gown changed, pt bottom cleaned with wipes. Skin WNL on bottom, advised pt that we should turn him on his side but he refuses at this time stating "I'm alright, I want to lay on my back to sleep" Pt given ice per request

## 2019-02-24 NOTE — ED Notes (Signed)
Pt given meal tray and coke to drink

## 2019-02-24 NOTE — ED Notes (Signed)
Pt changed. Bowel movement noted.  Clean brief and chuck and new warm blankets. Pt given ice as requested.  Ensure, chips and ice are at bedside.  Told tech "god must have sent you. You are my angel" :)  LW EDT

## 2019-02-24 NOTE — ED Notes (Signed)
Pt resting quietly, no distress noted, pt given a cup of ice upon request.

## 2019-02-24 NOTE — ED Notes (Signed)
Pt sitting upright in bed, sipping on his coffee, no distress noted at this time, states that he is "doing fine" this morning when asked

## 2019-02-25 NOTE — Progress Notes (Signed)
Physical Therapy Treatment Patient Details Name: Daniel Edwards MRN: [id number] DOB: Jan 03, 1935 Today's Date: 02/25/2019    History of Present Illness 85 y.o. male with a history of BPH, COPD, hypertension, GERD who presents to the ED for altered mental status and weakness.  EMS reports he has been having recurrent episodes of altered mental status recently.  He apparently called his son today and did not say anything.  He was 84% on room air then placed on 4 L nasal cannula.  He was noted to have A. fib.  MD assessment includes AMS and tachycardia.    PT Comments    Pt found initially declining to participate with PT services but with encouragement pt agreed.  Pt required min A with bed mobility and transfers but made good effort and progress towards goals with both.  Pt anxious regarding amb but after amb 15' and having a seated rest break the pt was then able to amb 30' with improved cadence and B step length as well as with improved confidence.  Pt's SpO2 and HR were WNL on 1LO2/min.  Pt will benefit from PT services in a SNF setting upon discharge to safely address deficits listed in patient problem list for decreased caregiver assistance and eventual return to PLOF.     Follow Up Recommendations  SNF     Equipment Recommendations  None recommended by PT    Recommendations for Other Services       Precautions / Restrictions Precautions Precautions: Fall Restrictions Weight Bearing Restrictions: No    Mobility  Bed Mobility Overal bed mobility: Needs Assistance Bed Mobility: Supine to Sit;Sit to Supine;Rolling Rolling: Min assist   Supine to sit: Min assist Sit to supine: Min assist   General bed mobility comments: Min A for BLE and trunk control  Transfers Overall transfer level: Needs assistance Equipment used: Rolling walker (2 wheeled) Transfers: Sit to/from Stand Sit to Stand: Min assist;From elevated surface         General transfer comment: Mod verbal and  tactile cues for sequencing and min A to come to standing  Ambulation/Gait Ambulation/Gait assistance: Min assist Gait Distance (Feet): 30 Feet x 1, 15 Feet x 1 Assistive device: Rolling walker (2 wheeled) Gait Pattern/deviations: Step-through pattern;Trunk flexed;Decreased step length - right;Decreased step length - left Gait velocity: decreased   General Gait Details: Slow cadence with amb with short B step length but no LOB   Stairs             Wheelchair Mobility    Modified Rankin (Stroke Patients Only)       Balance Overall balance assessment: Needs assistance Sitting-balance support: Feet supported;Bilateral upper extremity supported Sitting balance-Leahy Scale: Fair     Standing balance support: Bilateral upper extremity supported;During functional activity Standing balance-Leahy Scale: Fair Standing balance comment: Mod lean on the RW for support                            Cognition Arousal/Alertness: Awake/alert Behavior During Therapy: Anxious Overall Cognitive Status: Impaired/Different from baseline Area of Impairment: Orientation                                      Exercises Total Joint Exercises Ankle Circles/Pumps: Both;10 reps;15 reps;AROM Quad Sets: Strengthening;Both;10 reps;15 reps Gluteal Sets: Strengthening;Both;5 reps;10 reps Heel Slides: Both;10 reps;AROM Hip ABduction/ADduction: Both;10 reps;AROM Straight Leg  Raises: Both;10 reps;AROM Long Arc Quad: AROM;Both;10 reps;Strengthening;15 reps Knee Flexion: AROM;Both;10 reps;Strengthening;15 reps Marching in Standing: AROM;Both;5 reps;Standing Other Exercises Other Exercises: Pt education provided regarding physiological benefits of activity    General Comments        Pertinent Vitals/Pain Pain Assessment: No/denies pain    Home Living                      Prior Function            PT Goals (current goals can now be found in the care plan  section) Progress towards PT goals: Progressing toward goals    Frequency    Min 2X/week      PT Plan Current plan remains appropriate    Co-evaluation              AM-PAC PT "6 Clicks" Mobility   Outcome Measure  Help needed turning from your back to your side while in a flat bed without using bedrails?: A Little Help needed moving from lying on your back to sitting on the side of a flat bed without using bedrails?: A Little Help needed moving to and from a bed to a chair (including a wheelchair)?: A Little Help needed standing up from a chair using your arms (e.g., wheelchair or bedside chair)?: A Little Help needed to walk in hospital room?: A Little Help needed climbing 3-5 steps with a railing? : A Lot 6 Click Score: 17    End of Session Equipment Utilized During Treatment: Gait belt;Oxygen Activity Tolerance: Patient tolerated treatment well Patient left: in bed;with call bell/phone within reach Nurse Communication: Mobility status PT Visit Diagnosis: Unsteadiness on feet (R26.81);Muscle weakness (generalized) (M62.81);Difficulty in walking, not elsewhere classified (R26.2)     Time: 1410-1433 PT Time Calculation (min) (ACUTE ONLY): 23 min  Charges:  $Gait Training: 8-22 mins $Therapeutic Exercise: 8-22 mins                     D. Scott Ahmia Colford PT, DPT 02/25/19, 3:20 PM

## 2019-02-25 NOTE — ED Notes (Signed)
Patient is awake. Denies need for anything at this time.

## 2019-02-25 NOTE — ED Notes (Signed)
Patient's bed linens changed, gown changed, brief changed and blankets changed. Patient's skin overlying the sacrum and buttocks is non-red and intact. Patient requesting coffee which we will get for him. Patient is in need of a shave today. Patient appreciative of care given. Will relocate patient's bed to 1H as 11H is a busy area and with employees coming in and going out of the door the patient is not resting well.

## 2019-02-25 NOTE — ED Notes (Signed)
Spoke with son and updated him on the pt progress with PT today and any new info regarding placement. Son states that TV is pt life and being in the hall isn't the best but he understands. Will speak with administration about maybe a portable tv.

## 2019-02-25 NOTE — ED Notes (Addendum)
Pt given nightly meds by Raquel   Pt taken into room 1 and changed into dry brief by Mickel Baas EDT.  Pt given warm blankets and a cup of ice. Pt comfortable and resting well.  LW edt

## 2019-02-25 NOTE — ED Notes (Signed)
Report from [place], [place]   Patient asleep. Will continue to monitor.

## 2019-02-25 NOTE — ED Notes (Signed)
Pt brief changed, new chuck placed under patient, and pt clean/dry at this time. Pt gown also changed at this time, pt comfortable and in NAD. Will continue to monitor.

## 2019-02-25 NOTE — ED Notes (Signed)
Spoke with Paduchowski, MD about pt's o2 saturation. PT placed on 2L [place].

## 2019-02-25 NOTE — ED Notes (Signed)
Patient is sleeping soundly. Lights dimmed in hallway for patient comfort. Will continue to monitor.

## 2019-02-26 ENCOUNTER — Encounter: Payer: Self-pay | Admitting: Internal Medicine

## 2019-02-26 ENCOUNTER — Inpatient Hospital Stay: Payer: Medicare HMO

## 2019-02-26 ENCOUNTER — Emergency Department: Payer: Medicare HMO

## 2019-02-26 DIAGNOSIS — R0902 Hypoxemia: Secondary | ICD-10-CM

## 2019-02-26 DIAGNOSIS — Z9181 History of falling: Secondary | ICD-10-CM | POA: Diagnosis not present

## 2019-02-26 DIAGNOSIS — F17210 Nicotine dependence, cigarettes, uncomplicated: Secondary | ICD-10-CM | POA: Diagnosis present

## 2019-02-26 DIAGNOSIS — I482 Chronic atrial fibrillation, unspecified: Secondary | ICD-10-CM | POA: Diagnosis present

## 2019-02-26 DIAGNOSIS — Z20822 Contact with and (suspected) exposure to covid-19: Secondary | ICD-10-CM | POA: Diagnosis present

## 2019-02-26 DIAGNOSIS — I493 Ventricular premature depolarization: Secondary | ICD-10-CM | POA: Diagnosis present

## 2019-02-26 DIAGNOSIS — J189 Pneumonia, unspecified organism: Secondary | ICD-10-CM | POA: Diagnosis present

## 2019-02-26 DIAGNOSIS — R0602 Shortness of breath: Secondary | ICD-10-CM | POA: Diagnosis present

## 2019-02-26 DIAGNOSIS — Z681 Body mass index (BMI) 19 or less, adult: Secondary | ICD-10-CM | POA: Diagnosis not present

## 2019-02-26 DIAGNOSIS — F1010 Alcohol abuse, uncomplicated: Secondary | ICD-10-CM | POA: Diagnosis present

## 2019-02-26 DIAGNOSIS — Z7982 Long term (current) use of aspirin: Secondary | ICD-10-CM | POA: Diagnosis not present

## 2019-02-26 DIAGNOSIS — R627 Adult failure to thrive: Secondary | ICD-10-CM | POA: Diagnosis present

## 2019-02-26 DIAGNOSIS — N400 Enlarged prostate without lower urinary tract symptoms: Secondary | ICD-10-CM | POA: Diagnosis present

## 2019-02-26 DIAGNOSIS — K219 Gastro-esophageal reflux disease without esophagitis: Secondary | ICD-10-CM | POA: Diagnosis present

## 2019-02-26 DIAGNOSIS — I10 Essential (primary) hypertension: Secondary | ICD-10-CM | POA: Diagnosis present

## 2019-02-26 DIAGNOSIS — I4510 Unspecified right bundle-branch block: Secondary | ICD-10-CM | POA: Diagnosis present

## 2019-02-26 DIAGNOSIS — J440 Chronic obstructive pulmonary disease with acute lower respiratory infection: Secondary | ICD-10-CM | POA: Diagnosis present

## 2019-02-26 DIAGNOSIS — I480 Paroxysmal atrial fibrillation: Secondary | ICD-10-CM | POA: Diagnosis present

## 2019-02-26 DIAGNOSIS — N3000 Acute cystitis without hematuria: Secondary | ICD-10-CM | POA: Diagnosis present

## 2019-02-26 DIAGNOSIS — N179 Acute kidney failure, unspecified: Secondary | ICD-10-CM | POA: Diagnosis present

## 2019-02-26 DIAGNOSIS — E876 Hypokalemia: Secondary | ICD-10-CM | POA: Diagnosis present

## 2019-02-26 DIAGNOSIS — J9601 Acute respiratory failure with hypoxia: Secondary | ICD-10-CM | POA: Diagnosis present

## 2019-02-26 DIAGNOSIS — Z79899 Other long term (current) drug therapy: Secondary | ICD-10-CM | POA: Diagnosis not present

## 2019-02-26 DIAGNOSIS — J181 Lobar pneumonia, unspecified organism: Secondary | ICD-10-CM | POA: Diagnosis present

## 2019-02-26 LAB — CBC WITH DIFFERENTIAL/PLATELET
Abs Immature Granulocytes: 0.14 10*3/uL — ABNORMAL HIGH (ref 0.00–0.07)
Basophils Absolute: 0 10*3/uL (ref 0.0–0.1)
Basophils Relative: 0 %
Eosinophils Absolute: 0 10*3/uL (ref 0.0–0.5)
Eosinophils Relative: 0 %
HCT: 34.3 % — ABNORMAL LOW (ref 39.0–52.0)
Hemoglobin: 11.5 g/dL — ABNORMAL LOW (ref 13.0–17.0)
Immature Granulocytes: 1 %
Lymphocytes Relative: 5 %
Lymphs Abs: 0.8 10*3/uL (ref 0.7–4.0)
MCH: 28.8 pg (ref 26.0–34.0)
MCHC: 33.5 g/dL (ref 30.0–36.0)
MCV: 85.8 fL (ref 80.0–100.0)
Monocytes Absolute: 1.5 10*3/uL — ABNORMAL HIGH (ref 0.1–1.0)
Monocytes Relative: 9 %
Neutro Abs: 14 10*3/uL — ABNORMAL HIGH (ref 1.7–7.7)
Neutrophils Relative %: 85 %
Platelets: 325 10*3/uL (ref 150–400)
RBC: 4 MIL/uL — ABNORMAL LOW (ref 4.22–5.81)
RDW: 13.7 % (ref 11.5–15.5)
WBC: 16.6 10*3/uL — ABNORMAL HIGH (ref 4.0–10.5)
nRBC: 0 % (ref 0.0–0.2)

## 2019-02-26 LAB — RESPIRATORY PANEL BY RT PCR (FLU A&B, COVID)
Influenza A by PCR: NEGATIVE
Influenza B by PCR: NEGATIVE
SARS Coronavirus 2 by RT PCR: NEGATIVE

## 2019-02-26 LAB — BASIC METABOLIC PANEL
Anion gap: 15 (ref 5–15)
BUN: 19 mg/dL (ref 8–23)
CO2: 26 mmol/L (ref 22–32)
Calcium: 5.4 mg/dL — CL (ref 8.9–10.3)
Chloride: 94 mmol/L — ABNORMAL LOW (ref 98–111)
Creatinine, Ser: 0.93 mg/dL (ref 0.61–1.24)
GFR calc Af Amer: >60 mL/min (ref >60)
GFR calc non Af Amer: >60 mL/min (ref >60)
Glucose, Bld: 151 mg/dL — ABNORMAL HIGH (ref 70–99)
Potassium: 3.5 mmol/L (ref 3.5–5.1)
Sodium: 135 mmol/L (ref 135–145)

## 2019-02-26 LAB — URINALYSIS, COMPLETE (UACMP) WITH MICROSCOPIC
Bilirubin Urine: NEGATIVE
Glucose, UA: NEGATIVE mg/dL
Hgb urine dipstick: NEGATIVE
Ketones, ur: NEGATIVE mg/dL
Nitrite: POSITIVE — AB
Protein, ur: 30 mg/dL — AB
Specific Gravity, Urine: 1.026 (ref 1.005–1.030)
Squamous Epithelial / HPF: NONE SEEN (ref 0–5)
WBC, UA: >50 WBC/hpf — ABNORMAL HIGH (ref 0–5)
pH: 6 (ref 5.0–8.0)

## 2019-02-26 LAB — ABO/RH: ABO/RH(D): O NEG

## 2019-02-26 LAB — PROCALCITONIN: Procalcitonin: <0.1 ng/mL

## 2019-02-26 LAB — FIBRIN DERIVATIVES D-DIMER (ARMC ONLY): Fibrin derivatives D-dimer (ARMC): 1502.01 ng/mL (FEU) — ABNORMAL HIGH (ref 0.00–499.00)

## 2019-02-26 MED ORDER — ADULT MULTIVITAMIN W/MINERALS CH
1.0000 | ORAL_TABLET | Freq: Every day | ORAL | Status: DC
  Administered 2019-02-27 – 2019-03-01 (×3): 1 via ORAL
  Filled 2019-02-26 (×3): qty 1

## 2019-02-26 MED ORDER — BENZONATATE 100 MG PO CAPS
200.0000 mg | ORAL_CAPSULE | Freq: Three times a day (TID) | ORAL | Status: DC
  Administered 2019-02-26 – 2019-03-01 (×8): 200 mg via ORAL
  Filled 2019-02-26 (×9): qty 2

## 2019-02-26 MED ORDER — IPRATROPIUM-ALBUTEROL 0.5-2.5 (3) MG/3ML IN SOLN: 3.0000 mL | RESPIRATORY_TRACT | Status: DC | PRN

## 2019-02-26 MED ORDER — THIAMINE HCL 100 MG PO TABS
100.0000 mg | ORAL_TABLET | Freq: Every day | ORAL | Status: DC
  Administered 2019-02-27 – 2019-03-01 (×3): 100 mg via ORAL
  Filled 2019-02-26 (×3): qty 1

## 2019-02-26 MED ORDER — IOHEXOL 350 MG/ML SOLN
75.0000 mL | Freq: Once | INTRAVENOUS | Status: AC | PRN
  Administered 2019-02-26: 75 mL via INTRAVENOUS

## 2019-02-26 MED ORDER — SENNOSIDES-DOCUSATE SODIUM 8.6-50 MG PO TABS: 1.0000 | ORAL_TABLET | Freq: Every evening | ORAL | Status: DC | PRN

## 2019-02-26 MED ORDER — SODIUM CHLORIDE 0.9 % IV SOLN
1.0000 g | INTRAVENOUS | Status: DC
  Administered 2019-02-26 – 2019-02-28 (×3): 1 g via INTRAVENOUS
  Filled 2019-02-26: qty 1
  Filled 2019-02-26: qty 10
  Filled 2019-02-26: qty 1
  Filled 2019-02-26: qty 10

## 2019-02-26 MED ORDER — DEXAMETHASONE SODIUM PHOSPHATE 10 MG/ML IJ SOLN: 6.0000 mg | INTRAMUSCULAR | Status: DC

## 2019-02-26 MED ORDER — ENOXAPARIN SODIUM 40 MG/0.4ML ~~LOC~~ SOLN
40.0000 mg | SUBCUTANEOUS | Status: DC
  Administered 2019-02-26 – 2019-02-28 (×3): 40 mg via SUBCUTANEOUS
  Filled 2019-02-26 (×3): qty 0.4

## 2019-02-26 MED ORDER — FOLIC ACID 1 MG PO TABS
1.0000 mg | ORAL_TABLET | Freq: Every day | ORAL | Status: DC
  Administered 2019-02-27 – 2019-03-01 (×3): 1 mg via ORAL
  Filled 2019-02-26 (×3): qty 1

## 2019-02-26 MED ORDER — SODIUM CHLORIDE 0.9 % IV SOLN
500.0000 mg | INTRAVENOUS | Status: DC
  Administered 2019-02-26 – 2019-02-28 (×3): 500 mg via INTRAVENOUS
  Filled 2019-02-26 (×5): qty 500

## 2019-02-26 MED ORDER — ACETAMINOPHEN 325 MG PO TABS: 650.0000 mg | ORAL_TABLET | Freq: Four times a day (QID) | ORAL | Status: DC | PRN

## 2019-02-26 NOTE — ED Provider Notes (Addendum)
8:22 AM alerted by nurse Hrs were kind of fast in the 130s on monitor-- suspect some artifact because EKG was afib rate of 85, no st elevation, no twi, normal intervals. Will continue to monitor Hrs at this time. Home metoprolol has been ordered.   1:24 PM patient is now having a worsening cough and was 93% on room air.  Will get repeat Covid, repeat labs and repeat chest x-ray.  Chest x-ray was negative.  White count is trending up.  Will order CT PE to make sure there is no evidence of pulmonary embolism.  Will discuss with hospital team for admission.      Vanessa [place], MD 02/26/19 1501

## 2019-02-26 NOTE — ED Notes (Signed)
Pt otf for imaging 

## 2019-02-26 NOTE — ED Notes (Signed)
Pt given lunch tray and water, pt reports "I just ate, I am not hungry right now". Pt's tray at bedside if he changes his mind.

## 2019-02-26 NOTE — ED Notes (Signed)
Pt's soiled brief removed. Pt cleaned up and brief changed. No other needs expressed at this time

## 2019-02-26 NOTE — ED Provider Notes (Signed)
-----------------------------------------   3:09 AM on 02/26/2019 -----------------------------------------   Blood pressure 140/67, pulse 73, temperature 98.5 F (36.9 C), temperature source Oral, resp. rate 20, height 1.778 m (5\' 10" ), weight 56.8 kg, SpO2 95 %.  The patient is calm and cooperative at this time.  There have been no acute events since the last update.  Awaiting disposition plan from Social Work team(s).   Hinda Kehr, MD 02/26/19 [phone]

## 2019-02-26 NOTE — ED Notes (Signed)
Pt attempting to use urinal without success, no urine in urinal when pt states he is done urinating. Educated on condom catheter placement and use but pt refuses condom cath.

## 2019-02-26 NOTE — ED Notes (Signed)
Report given to Molly, RN

## 2019-02-26 NOTE — ED Notes (Addendum)
PT O2 saturation on RA once O2 was removed is 93%, this RN observed pt coughing as well. Placed back on 2L [place] and DR. Funke informed of pt's status, see new orders

## 2019-02-26 NOTE — ED Notes (Signed)
Pt breakfast tray set up and pt eating. Pt has no further needs at this time.

## 2019-02-26 NOTE — H&P (Signed)
History and Physical    Daniel Edwards [id number] DOB: [birth year] DOA: 02/18/2019  PCP: Baxter Hire, MD  Patient coming from: Skilled nursing facility  I have personally briefly reviewed patient's old medical records in [place]  Chief Complaint: Cough, shortness of breath  HPI: Daniel Edwards is a 85 y.o. male with medical history significant of BPH, COPD, hypertension, GERD, atrial fibrillation, chronic alcoholism, severe protein calorie malnutrition, failure to thrive who presented to the emergency department initially 8 days prior to this evaluation for chief complaint of altered mental status and weakness.  The patient was pretty hospitalized in December 2020 and was discharged [facility].  Apparently according to case management social work notes the patient is unable to return to [facility] due to an outstanding bill.  Case management at [facility] is currently working with the facility as well as the patient's family in order to find an appropriate disposition.  However on 02/26/2019 the patient developed some cough associated with some shortness of breath, chest tightness, decrease in overall oxygen saturation.  Given concern for sepsis versus Covid repeat labs and imaging were performed.  Chest x-ray overall unremarkable.  Notably Covid RT-PCR was negative.  Patient is afebrile however does have a elevated white count to 16 with neutrophilic predominance.  Given the patient's new clinical findings he will be admitted to the hospital under the presumptive diagnosis of community-acquired pneumonia.  By criteria he does meet for sepsis.  ED Course: Patient has been holding in the emergency department for 8 days due to placement issues.  Today patient had cough with new oxygen requirement and some chest tightness.  Laboratory work-up significant for leukocytosis of 16 with neutrophilic predominance.  Covid RT-PCR was sent and was negative.  Procalcitonin sent and is  pending at the time of this note.  CT angio thorax was ordered and is pending at time of this note.  Given concerns for sepsis patient will be admitted to the hospitalist service.  Review of Systems: As per HPI otherwise 10 point review of systems negative.    Past Medical History:  Diagnosis Date  . BPH (benign prostatic hyperplasia)   . COPD (chronic obstructive pulmonary disease) ([place])   . Erectile dysfunction   . Essential hypertension   . GERD (gastroesophageal reflux disease)   . Simple chronic bronchitis ([place])     Past Surgical History:  Procedure Laterality Date  . INGUINAL HERNIA REPAIR       reports that he has been smoking cigarettes. He has been smoking about 1.00 pack per day. He has never used smokeless tobacco. He reports current alcohol use. No history on file for drug.  No Known Allergies  Family History  Problem Relation Age of Onset  . Cancer Mother     Prior to Admission medications   Medication Sig Start Date End Date Taking? Authorizing Provider  aspirin 325 MG tablet Take 325 mg by mouth daily.   Yes [provider]  diphenhydramine-acetaminophen (TYLENOL PM) 25-500 MG TABS tablet Take 1 tablet by mouth at bedtime as needed (pain or sleep).   Yes [provider]  feeding supplement, ENSURE ENLIVE, (ENSURE ENLIVE) LIQD Take 237 mLs by mouth 3 (three) times daily between meals. 01/27/19  Yes Amara Manalang B, MD  folic acid (FOLVITE) 1 MG tablet Take 1 tablet (1 mg total) by mouth daily. 01/28/19  Yes Celest Reitz B, MD  loperamide (IMODIUM) 2 MG capsule Take 1 capsule (2 mg total)  by mouth as needed for diarrhea or loose stools. 01/27/19  Yes Lizet Kelso B, MD  magnesium oxide (MAG-OX) 400 (241.3 Mg) MG tablet Take 1 tablet (400 mg total) by mouth 2 (two) times daily. 01/31/19  Yes Nolberto Hanlon, MD  metoprolol tartrate (LOPRESSOR) 25 MG tablet Take 1 tablet (25 mg total) by mouth 2 (two) times daily. Patient taking  differently: Take 37.5 mg by mouth 2 (two) times daily.  11/21/18  Yes Vaughan Basta, MD  Multiple Vitamin (MULTIVITAMIN WITH MINERALS) TABS tablet Take 1 tablet by mouth daily. 01/28/19  Yes Charly Hunton B, MD  omeprazole (PRILOSEC) 20 MG capsule Take 20 mg by mouth daily.    Yes [provider]  tamsulosin (FLOMAX) 0.4 MG CAPS capsule Take 1 capsule (0.4 mg total) by mouth daily. 11/21/18  Yes Vaughan Basta, MD  thiamine 100 MG tablet Take 1 tablet (100 mg total) by mouth daily. 01/28/19  Yes Sidney Ace, MD    Physical Exam: Vitals:   02/25/19 2002 02/25/19 2250 02/26/19 0807 02/26/19 1224  BP: (!) 113/59 140/67 (!) 162/80 138/67  Pulse: 73 73 (!) 106 100  Resp: 18 20 18 18   Temp:   98.4 F (36.9 C)   TempSrc:   Oral   SpO2: 96% 95% 95% 93%  Weight:      Height:        Constitutional: NAD, calm, comfortable Vitals:   02/25/19 2002 02/25/19 2250 02/26/19 0807 02/26/19 1224  BP: (!) 113/59 140/67 (!) 162/80 138/67  Pulse: 73 73 (!) 106 100  Resp: 18 20 18 18   Temp:   98.4 F (36.9 C)   TempSrc:   Oral   SpO2: 96% 95% 95% 93%  Weight:      Height:       Eyes: PERRL, lids and conjunctivae normal ENMT: Mucous membranes are dry. Posterior pharynx clear of any exudate or lesions.Normal dentition.  Neck: normal, supple, no masses, no thyromegaly Respiratory: Mild scattered crackles.  Normal work of breathing Cardiovascular: Regular rate.  No murmur Abdomen: no tenderness, no masses palpated. No hepatosplenomegaly. Bowel sounds positive.  Musculoskeletal: no clubbing / cyanosis. No joint deformity upper and lower extremities. Good ROM, no contractures. Normal muscle tone.  Skin: no rashes, lesions, ulcers. No induration Neurologic: CN 2-12 grossly intact. Sensation intact, DTR normal. Strength 5/5 in all 4.  Psychiatric: Normal judgment and insight. Alert and oriented x 3. Normal mood.    Labs on Admission: I have personally reviewed  following labs and imaging studies  CBC: Recent Labs  Lab 02/26/19 1352  WBC 16.6*  NEUTROABS 14.0*  HGB 11.5*  HCT 34.3*  MCV 85.8  PLT [id number]   Basic Metabolic Panel: Recent Labs  Lab 02/26/19 1352  NA 135  K 3.5  CL 94*  CO2 26  GLUCOSE 151*  BUN 19  CREATININE 0.93  CALCIUM 5.4*   GFR: Estimated Creatinine Clearance: 49.2 mL/min (by C-G formula based on SCr of 0.93 mg/dL). Liver Function Tests: No results for input(s): AST, ALT, ALKPHOS, BILITOT, PROT, ALBUMIN in the last 168 hours. No results for input(s): LIPASE, AMYLASE in the last 168 hours. No results for input(s): AMMONIA in the last 168 hours. Coagulation Profile: No results for input(s): INR, PROTIME in the last 168 hours. Cardiac Enzymes: No results for input(s): CKTOTAL, CKMB, CKMBINDEX, TROPONINI in the last 168 hours. BNP (last 3 results) No results for input(s): PROBNP in the last 8760 hours. HbA1C: No results for input(s): HGBA1C in  the last 72 hours. CBG: No results for input(s): GLUCAP in the last 168 hours. Lipid Profile: No results for input(s): CHOL, HDL, LDLCALC, TRIG, CHOLHDL, LDLDIRECT in the last 72 hours. Thyroid Function Tests: No results for input(s): TSH, T4TOTAL, FREET4, T3FREE, THYROIDAB in the last 72 hours. Anemia Panel: No results for input(s): VITAMINB12, FOLATE, FERRITIN, TIBC, IRON, RETICCTPCT in the last 72 hours. Urine analysis:    Component Value Date/Time   COLORURINE YELLOW (A) 02/18/2019 1320   APPEARANCEUR CLEAR (A) 02/18/2019 1320   APPEARANCEUR Cloudy 01/26/2013 1319   LABSPEC 1.017 02/18/2019 1320   LABSPEC 1.009 01/26/2013 1319   PHURINE 5.0 02/18/2019 1320   GLUCOSEU NEGATIVE 02/18/2019 1320   GLUCOSEU Negative 01/26/2013 1319   HGBUR SMALL (A) 02/18/2019 1320   BILIRUBINUR NEGATIVE 02/18/2019 1320   BILIRUBINUR Negative 01/26/2013 1319   KETONESUR NEGATIVE 02/18/2019 1320   PROTEINUR 30 (A) 02/18/2019 1320   NITRITE NEGATIVE 02/18/2019 1320    LEUKOCYTESUR NEGATIVE 02/18/2019 1320   LEUKOCYTESUR 3+ 01/26/2013 1319    Radiological Exams on Admission: DG Chest 2 View  Result Date: 02/26/2019 CLINICAL DATA:  Shortness of breath EXAM: CHEST - 2 VIEW COMPARISON:  February 18, 2019 FINDINGS: Lungs are clear. Heart size and pulmonary vascularity are normal. No adenopathy. There is aortic atherosclerosis. No evident bone lesions. IMPRESSION: Lungs clear. Cardiac silhouette normal. No adenopathy. Aortic Atherosclerosis (ICD10-I70.0). Electronically Signed   By: Lowella Grip III M.D.   On: 02/26/2019 14:10    EKG: Independently reviewed.  Regular rate.  Irregular rhythm.  Atrial fibrillation  Assessment/Plan Active Problems:   Hypoxia  Suspected early community-acquired pneumonia Hypoxic respiratory disease secondary to above Patient has been in the emergency department for 8 days He has had multiple negative Covid test including 1 today His labs are not entirely consistent with Covid infection Differential demonstrates neutrophilic predominance with no lymphopenia Procalcitonin sent and is pending at time of this note Patient has new oxygen requirement as well as new cough Plan: Admit inpatient Blood cultures x2 Urinalysis with reflex culture LR at 75cc/hr x 10 hours Empiric Rocephin 1 g IV every 24 hours Azithromycin 500 mg IV daily Supplemental oxygen, wean as tolerated Follow cultures for pathogen identification Patient may need broader coverage given his recent healthcare exposure however at this time he is hemodynamically stable and afebrile.  Recommend expanding antibiotic coverage should any concerns for worsening sepsis arise  Atrial fibrillation Patient is currently rate controlled on metoprolol Not on therapeutic anticoagulation Last echocardiogram in October 2020: 55 to 60% with no wall motion abnormalities and normal diastolic function Plan: Repeat echocardiogram Continue metoprolol Telemetry  monitoring Defer anticoagulation for now as patient is a fall risk however will need to consider long-term therapeutic anticoagulation post echocardiogram  COPD Not currently in exacerbation As needed duo nebs  GERD Protonix  Alcohol abuse Failure to thrive Patient has been recently hospitalized and then upon return has been holding in the ED for 8 days Case management aware and is actively seeking skilled nursing facility versus LTAC placement TOC team consult placed  DVT prophylaxis: Lovenox Code Status: Full Family Communication: None Disposition Plan: SNF vs LTAC Consults called: none Admission status: inpatient   Sidney Ace MD [facility] Pager [phone]  If 7PM-7AM, please contact night-coverage [email or web address] Password [facility]  02/26/2019, 3:32 PM

## 2019-02-26 NOTE — ED Notes (Signed)
Pt soiled brief with urine. Brief changed and pt cleaned up, new brief provided. No needs expressed at this time

## 2019-02-26 NOTE — ED Notes (Addendum)
Dr. Jari Pigg informed of critical calcium

## 2019-02-26 NOTE — Care Management (Signed)
TOC RN CM: Spoke with son regarding lack of bed availability and outstanding bill with [facility]. Son states he has no bill remaining-advised him to contact [facility] and discuss in more detail. Son is concerned that patient remains in a hallway bed-reassured that patient is being monitored by staff regardless of room placement. Continue searching for SNF/LTC placement.

## 2019-02-26 NOTE — ED Notes (Signed)
Pt took medications whole without difficulty. Pt ice water refilled, no needs expressed at this time

## 2019-02-26 NOTE — Evaluation (Addendum)
Clinical/Bedside Swallow Evaluation Patient Details  Name: Daniel Edwards MRN: [id number] Date of Birth: [birth year]  Today's Date: 02/26/2019 Time: SLP Start Time (ACUTE ONLY): 0940 SLP Stop Time (ACUTE ONLY): 1030 SLP Time Calculation (min) (ACUTE ONLY): 50 min  Past Medical History:  Past Medical History:  Diagnosis Date  . BPH (benign prostatic hyperplasia)   . COPD (chronic obstructive pulmonary disease) ([place])   . Erectile dysfunction   . Essential hypertension   . GERD (gastroesophageal reflux disease)   . Simple chronic bronchitis ([place])    Past Surgical History:  Past Surgical History:  Procedure Laterality Date  . INGUINAL HERNIA REPAIR     HPI:  Pt is a 85 y.o. male with a history of BPH, COPD, hypertension, GERD and recent chronic use of ETOH w/ severe protein malnutirion and FTT(last admission notes) who presents to the ED for altered mental status and weakness.  EMS reports he has been having recurrent episodes of altered mental status in recent weeks.  Previous hospitalization in 01/2019.  Pt was residing at [facility] but is unable to return there d/t outstanding bill per SW note. Son is working on a Midwife for the [place] pt needs per MD/SW notes.  At last admission in 01/2019, "Patient has not been eating or drinking just taking alcohol daily. Patient has generalized debility to the point that he is not able to move around on his own. Family believes he is unable to take care of himself at home"; pt lived alone then.  He was discharged to SNF for Rehab post that admit. Unsure of pt's baseline Cognitive status d/t ETOH abuse.    Assessment / Plan / Recommendation Clinical Impression  Pt appears to present w/ adequate oropharyngeal phase swallowing function w/ No overt clinical s/s of aspiration noted during po trials at BSE; most meals w/ NSG during this admission/time in the ED. NSG is Not aware of any difficulty swallowing this morning -- noted pt has  been eating many meals and requesting drinks since admission to the ED on 02/18/2019. Pt himself Denied any difficulty at this session w/ eating his foods, chewing, or drinking liquids. CXR results at admit indicated "No active disease"; BS normal per NSG report.  Pt easily agitated w/ questions from SLP for the BSE -- "Jesus Christ you ask a lot of questions". Suspect a degree of Mild Cognitive Impairment d/t chronic ETOH abuse per chart notes; pt was living alone and unable to care for himself per family report last admit. There has been FTT; suspect Cognitive status could impact his overall oral intake/desire for such(as noted in chart notes last admit).  During po trials, pt fed self w/ setup support. Noted he was min shaky in his UEs. OM exam WFL. Pt consumed po trials w/ No overt clinical s/s of aspiration; no coughing or wet vocal quality noted or decline in respiratory status during/post trials. Oral phase was grossly [facility] for bolus management of trials -- pt only accepted 2 trials of solids(min M[place] mastication pattern noted but grossly [facility]). Oral clearing achieved w/ all trials.   Recommend continue current diet w/ meats cut, moistened; thin liquids via cup/straw(monitor). Pt has a baseline of GERD so Reflux precautions; general aspiration precautions. Pills in Puree IF needed for easier swallowing.  No further skilled ST services indicated at this time as pt appears at his baseline.  SLP Visit Diagnosis: Dysphagia, unspecified (R13.10)(suspect Cognitive decline)    Aspiration Risk  Risk for inadequate  nutrition/hydration(baseline FTT per chart)    Diet Recommendation  Regular diet w/ Meats cut, moistened; Thin liquids. General Reflux and aspiration precautions; tray setup at meals, positioning upright  Medication Administration: Whole meds with puree(IF needed for easier swallowing d/t Cognition)    Other  Recommendations Recommended Consults: Consider GI evaluation(for PPI tx; Dietician  f/u) Oral Care Recommendations: Oral care BID;Patient independent with oral care Other Recommendations: (n/a)   Follow up Recommendations None      Frequency and Duration (n/a)  (n/a)       Prognosis Prognosis for Safe Diet Advancement: Good Barriers to Reach Goals: Cognitive deficits;Severity of deficits;Behavior(min noted)      Swallow Study   General Date of Onset: 02/18/19 HPI: Pt is a 86 y.o. male with a history of BPH, COPD, hypertension, GERD and recent chronic use of ETOH w/ severe protein malnutirion and FTT(last admission notes) who presents to the ED for altered mental status and weakness.  EMS reports he has been having recurrent episodes of altered mental status in recent weeks.  Previous hospitalization in 01/2019.  Pt was residing at [facility] but is unable to return there d/t outstanding bill per SW note.  Son is working on a Midwife for the [place] pt needs per MD/SW notes.  At last admission in 01/2019, "Patient has not been eating or drinking just taking alcohol daily.  Patient has generalized debility to the point that he is not able to move around on his own.  Family believes he is unable to take care of himself at home; pt lived alone then.  He was discharged to SNF for Rehab then.  Unsure of pt's baseline Cognitive status d/t ETOH abuse.  Type of Study: Bedside Swallow Evaluation Previous Swallow Assessment: none Diet Prior to this Study: Regular;Thin liquids Temperature Spikes Noted: No Respiratory Status: Room air History of Recent Intubation: No Behavior/Cognition: Alert;Cooperative;Pleasant mood;Confused;Distractible;Requires cueing(Easily agitated w/ questions from SLP) Oral Cavity Assessment: Within Functional Limits Oral Care Completed by SLP: Recent completion by staff Oral Cavity - Dentition: Adequate natural dentition Vision: Functional for self-feeding Self-Feeding Abilities: Able to feed self;Needs assist;Needs set up Patient  Positioning: Upright in bed(needed min positioning support/cues) Baseline Vocal Quality: Normal Volitional Cough: Strong Volitional Swallow: Able to elicit    Oral/Motor/Sensory Function Overall Oral Motor/Sensory Function: Within functional limits   Ice Chips Ice chips: Not tested   Thin Liquid Thin Liquid: Within functional limits Presentation: Cup;Self Fed;Straw(3 trials via each)    Nectar Thick Nectar Thick Liquid: Not tested   Honey Thick Honey Thick Liquid: Not tested   Puree Puree: Within functional limits Presentation: Self Fed;Spoon(7+ trials)   Solid     Solid: Within functional limits(grossly) Presentation: Self Fed(2 trials but refused more afterwards)       Orinda Kenner, MS, CCC-SLP Lachlan Pelto 02/26/2019,11:04 AM

## 2019-02-26 NOTE — ED Notes (Signed)
Pt given dinner tray.

## 2019-02-26 NOTE — ED Notes (Signed)
Pt given a cup of coffee. Breakfast ordered for pt from cafeteria. Pt was offered a shower and stated "I will take one after breakfast."  This tech agreeable with pt.

## 2019-02-27 DIAGNOSIS — J181 Lobar pneumonia, unspecified organism: Secondary | ICD-10-CM

## 2019-02-27 DIAGNOSIS — N3000 Acute cystitis without hematuria: Secondary | ICD-10-CM

## 2019-02-27 DIAGNOSIS — J9601 Acute respiratory failure with hypoxia: Secondary | ICD-10-CM

## 2019-02-27 DIAGNOSIS — R531 Weakness: Secondary | ICD-10-CM

## 2019-02-27 DIAGNOSIS — I482 Chronic atrial fibrillation, unspecified: Secondary | ICD-10-CM

## 2019-02-27 DIAGNOSIS — E876 Hypokalemia: Secondary | ICD-10-CM

## 2019-02-27 LAB — CBC WITH DIFFERENTIAL/PLATELET
Abs Immature Granulocytes: 0.14 10*3/uL — ABNORMAL HIGH (ref 0.00–0.07)
Basophils Absolute: 0 10*3/uL (ref 0.0–0.1)
Basophils Relative: 0 %
Eosinophils Absolute: 0 10*3/uL (ref 0.0–0.5)
Eosinophils Relative: 0 %
HCT: 36.9 % — ABNORMAL LOW (ref 39.0–52.0)
Hemoglobin: 12.4 g/dL — ABNORMAL LOW (ref 13.0–17.0)
Immature Granulocytes: 1 %
Lymphocytes Relative: 7 %
Lymphs Abs: 1.2 10*3/uL (ref 0.7–4.0)
MCH: 28.4 pg (ref 26.0–34.0)
MCHC: 33.6 g/dL (ref 30.0–36.0)
MCV: 84.4 fL (ref 80.0–100.0)
Monocytes Absolute: 1.2 10*3/uL — ABNORMAL HIGH (ref 0.1–1.0)
Monocytes Relative: 7 %
Neutro Abs: 13.4 10*3/uL — ABNORMAL HIGH (ref 1.7–7.7)
Neutrophils Relative %: 85 %
Platelets: 344 10*3/uL (ref 150–400)
RBC: 4.37 MIL/uL (ref 4.22–5.81)
RDW: 13.7 % (ref 11.5–15.5)
WBC: 15.9 10*3/uL — ABNORMAL HIGH (ref 4.0–10.5)
nRBC: 0 % (ref 0.0–0.2)

## 2019-02-27 LAB — COMPREHENSIVE METABOLIC PANEL
ALT: 44 U/L (ref 0–44)
AST: 51 U/L — ABNORMAL HIGH (ref 15–41)
Albumin: 2.6 g/dL — ABNORMAL LOW (ref 3.5–5.0)
Alkaline Phosphatase: 61 U/L (ref 38–126)
Anion gap: 14 (ref 5–15)
BUN: 17 mg/dL (ref 8–23)
CO2: 26 mmol/L (ref 22–32)
Calcium: 5.4 mg/dL — CL (ref 8.9–10.3)
Chloride: 98 mmol/L (ref 98–111)
Creatinine, Ser: 0.81 mg/dL (ref 0.61–1.24)
GFR calc Af Amer: >60 mL/min (ref >60)
GFR calc non Af Amer: >60 mL/min (ref >60)
Glucose, Bld: 113 mg/dL — ABNORMAL HIGH (ref 70–99)
Potassium: 3.5 mmol/L (ref 3.5–5.1)
Sodium: 138 mmol/L (ref 135–145)
Total Bilirubin: 0.8 mg/dL (ref 0.3–1.2)
Total Protein: 6.4 g/dL — ABNORMAL LOW (ref 6.5–8.1)

## 2019-02-27 LAB — MAGNESIUM
Magnesium: 0.3 mg/dL — CL (ref 1.7–2.4)
Magnesium: 0.3 mg/dL — CL (ref 1.7–2.4)

## 2019-02-27 LAB — C-REACTIVE PROTEIN: CRP: 22 mg/dL — ABNORMAL HIGH (ref <1.0)

## 2019-02-27 LAB — PROCALCITONIN: Procalcitonin: <0.1 ng/mL

## 2019-02-27 LAB — PHOSPHORUS: Phosphorus: 4.9 mg/dL — ABNORMAL HIGH (ref 2.5–4.6)

## 2019-02-27 MED ORDER — POTASSIUM CHLORIDE CRYS ER 20 MEQ PO TBCR
40.0000 meq | EXTENDED_RELEASE_TABLET | ORAL | Status: AC
  Administered 2019-02-27: 40 meq via ORAL
  Filled 2019-02-27: qty 2

## 2019-02-27 MED ORDER — CALCIUM GLUCONATE-NACL 1-0.675 GM/50ML-% IV SOLN
1.0000 g | Freq: Once | INTRAVENOUS | Status: AC
  Administered 2019-02-27: 1000 mg via INTRAVENOUS
  Filled 2019-02-27: qty 50

## 2019-02-27 MED ORDER — MAGNESIUM SULFATE 4 GM/100ML IV SOLN
4.0000 g | Freq: Once | INTRAVENOUS | Status: AC
  Administered 2019-02-27: 4 g via INTRAVENOUS
  Filled 2019-02-27: qty 100

## 2019-02-27 NOTE — Progress Notes (Addendum)
Patient ID: Daniel Edwards, male   DOB: [birth year], 85 y.o.   MRN: [id number] [facility] PROGRESS NOTE  [facility]ANIS[place]AD[facility] SCORE [id number] DOB: [birth year] DOA: 02/18/2019 PCP: Baxter Hire, MD  HPI/Subjective: Patient feeling okay.  Offers no complaints.  States he is eating well.  Objective: Vitals:   02/27/19 0802 02/27/19 1440  BP: 127/73 114/66  Pulse: 91 82  Resp:  16  Temp:    SpO2: 94% 98%    Intake/Output Summary (Last 24 hours) at 02/27/2019 1455 Last data filed at 02/27/2019 0330 Gross per 24 hour  Intake 350 ml  Output --  Net 350 ml   Filed Weights   02/18/19 1315 02/21/19 0752  Weight: 56.8 kg 56.8 kg    ROS: Review of Systems  Constitutional: Negative for chills and fever.  Eyes: Negative for blurred vision.  Respiratory: Negative for cough and shortness of breath.   Cardiovascular: Negative for chest pain.  Gastrointestinal: Negative for abdominal pain, constipation, diarrhea, nausea and vomiting.  Genitourinary: Negative for dysuria.  Musculoskeletal: Negative for joint pain.  Neurological: Negative for dizziness and headaches.   Exam: Physical Exam  Constitutional: He is oriented to person, place, and time.  HENT:  Nose: No mucosal edema.  Mouth/Throat: No oropharyngeal exudate or posterior oropharyngeal edema.  Eyes: Pupils are equal, round, and reactive to light. Conjunctivae, EOM and lids are normal.  Neck: Carotid bruit is not present.  Cardiovascular: S1 normal and S2 normal. Exam reveals no gallop.  No murmur heard. Pulses:      Dorsalis pedis pulses are 2+ on the right side and 2+ on the left side.  Respiratory: No respiratory distress. He has no wheezes. He has no rhonchi. He has no rales.  GI: Soft. Bowel sounds are normal. There is no abdominal tenderness.  Musculoskeletal:     Right ankle: No swelling.     Left ankle: No swelling.  Lymphadenopathy:    He has no cervical adenopathy.  Neurological: He is alert and oriented to  person, place, and time. No cranial nerve deficit.  Skin: Skin is warm. No rash noted. Nails show no clubbing.  Psychiatric: He has a normal mood and affect.      Data Reviewed: Basic Metabolic Panel: Recent Labs  Lab 02/26/19 1352 02/27/19 0540 02/27/19 0648  NA 135 138  --   K 3.5 3.5  --   CL 94* 98  --   CO2 26 26  --   GLUCOSE 151* 113*  --   BUN 19 17  --   CREATININE 0.93 0.81  --   CALCIUM 5.4* 5.4*  --   MG  --  0.3* 0.3*  PHOS  --  4.9*  --    Liver Function Tests: Recent Labs  Lab 02/27/19 0540  AST 51*  ALT 44  ALKPHOS 61  BILITOT 0.8  PROT 6.4*  ALBUMIN 2.6*   CBC: Recent Labs  Lab 02/26/19 1352 02/27/19 0540  WBC 16.6* 15.9*  NEUTROABS 14.0* 13.4*  HGB 11.5* 12.4*  HCT 34.3* 36.9*  MCV 85.8 84.4  PLT 325 344     Recent Results (from the past 240 hour(s))  SARS CORONAVIRUS 2 (TAT 6-24 HRS) Nasopharyngeal Nasopharyngeal Swab     Status: None   Collection Time: 02/18/19 11:39 PM   Specimen: Nasopharyngeal Swab  Result Value Ref Range Status   SARS Coronavirus 2 NEGATIVE NEGATIVE Final    Comment: (NOTE) SARS-CoV-2 target nucleic acids are NOT DETECTED. The SARS-CoV-2 RNA  is generally detectable in upper and lower respiratory specimens during the acute phase of infection. Negative results do not preclude SARS-CoV-2 infection, do not rule out co-infections with other pathogens, and should not be used as the sole basis for treatment or other patient management decisions. Negative results must be combined with clinical observations, patient history, and epidemiological information. The expected result is Negative. Fact Sheet for Patients: SugarRoll.be Fact Sheet for Healthcare Providers: https://www.woods-mathews.com/ This test is not yet approved or cleared by the [place] FDA and  has been authorized for detection and/or diagnosis of SARS-CoV-2 by FDA under an Emergency Use Authorization  (EUA). This EUA will remain  in effect (meaning this test can be used) for the duration of the COVID-19 declaration under Section 56 4(b)(1) of the Act, 21 U.S.C. section 360bbb-3(b)(1), unless the authorization is terminated or revoked sooner. Performed at [facility], [place] [address], [place], [place] [address]   Respiratory Panel by RT PCR (Flu A&B, Covid) - Nasopharyngeal Swab     Status: None   Collection Time: 02/26/19  1:53 PM   Specimen: Nasopharyngeal Swab  Result Value Ref Range Status   SARS Coronavirus 2 by RT PCR NEGATIVE NEGATIVE Final    Comment: (NOTE) SARS-CoV-2 target nucleic acids are NOT DETECTED. The SARS-CoV-2 RNA is generally detectable in upper respiratoy specimens during the acute phase of infection. The lowest concentration of SARS-CoV-2 viral copies this assay can detect is 131 copies/mL. A negative result does not preclude SARS-Cov-2 infection and should not be used as the sole basis for treatment or other patient management decisions. A negative result may occur with  improper specimen collection/handling, submission of specimen other than nasopharyngeal swab, presence of viral mutation(s) within the areas targeted by this assay, and inadequate number of viral copies (<131 copies/mL). A negative result must be combined with clinical observations, patient history, and epidemiological information. The expected result is Negative. Fact Sheet for Patients:  PinkCheek.be Fact Sheet for Healthcare Providers:  GravelBags.it This test is not yet ap proved or cleared by the [place] FDA and  has been authorized for detection and/or diagnosis of SARS-CoV-2 by FDA under an Emergency Use Authorization (EUA). This EUA will remain  in effect (meaning this test can be used) for the duration of the COVID-19 declaration under Section 564(b)(1) of the Act, 21 U.S.C. section 360bbb-3(b)(1), unless the  authorization is terminated or revoked sooner.    Influenza A by PCR NEGATIVE NEGATIVE Final   Influenza B by PCR NEGATIVE NEGATIVE Final    Comment: (NOTE) The Xpert Xpress SARS-CoV-2/FLU/RSV assay is intended as an aid in  the diagnosis of influenza from Nasopharyngeal swab specimens and  should not be used as a sole basis for treatment. Nasal washings and  aspirates are unacceptable for Xpert Xpress SARS-CoV-2/FLU/RSV  testing. Fact Sheet for Patients: PinkCheek.be Fact Sheet for Healthcare Providers: GravelBags.it This test is not yet approved or cleared by the [place] FDA and  has been authorized for detection and/or diagnosis of SARS-CoV-2 by  FDA under an Emergency Use Authorization (EUA). This EUA will remain  in effect (meaning this test can be used) for the duration of the  Covid-19 declaration under Section 564(b)(1) of the Act, 21  U.S.C. section 360bbb-3(b)(1), unless the authorization is  terminated or revoked. Performed at [facility], [address] [place], [place] [address]   Culture, blood (Routine X 2) w Reflex to ID Panel     Status: None (Preliminary result)  Collection Time: 02/26/19  4:12 PM   Specimen: BLOOD  Result Value Ref Range Status   Specimen Description BLOOD LEFT ANTECUBITAL  Final   Special Requests   Final    BOTTLES DRAWN AEROBIC AND ANAEROBIC Blood Culture adequate volume   Culture   Final    NO GROWTH < 24 HOURS Performed at [facility], [address] [place], [place] [address]    Report Status PENDING  Incomplete  Culture, blood (Routine X 2) w Reflex to ID Panel     Status: None (Preliminary result)   Collection Time: 02/26/19  4:13 PM   Specimen: BLOOD  Result Value Ref Range Status   Specimen Description BLOOD BLOOD LEFT WRIST  Final   Special Requests   Final    BOTTLES DRAWN AEROBIC AND ANAEROBIC Blood Culture adequate volume   Culture    Final    NO GROWTH < 24 HOURS Performed at [facility], [address] [place], [place] [address]    Report Status PENDING  Incomplete     Studies: DG Chest 2 View  Result Date: 02/26/2019 CLINICAL DATA:  Shortness of breath EXAM: CHEST - 2 VIEW COMPARISON:  February 18, 2019 FINDINGS: Lungs are clear. Heart size and pulmonary vascularity are normal. No adenopathy. There is aortic atherosclerosis. No evident bone lesions. IMPRESSION: Lungs clear. Cardiac silhouette normal. No adenopathy. Aortic Atherosclerosis (ICD10-I70.0). Electronically Signed   By: Lowella Grip III M.D.   On: 02/26/2019 14:10   CT Angio Chest PE W and/or Wo Contrast  Result Date: 02/26/2019 CLINICAL DATA:  Shortness of breath EXAM: CT ANGIOGRAPHY CHEST WITH CONTRAST TECHNIQUE: Multidetector CT imaging of the chest was performed using the standard protocol during bolus administration of intravenous contrast. Multiplanar CT image reconstructions and MIPs were obtained to evaluate the vascular anatomy. CONTRAST:  76mL OMNIPAQUE IOHEXOL 350 MG/ML SOLN COMPARISON:  Chest radiograph February 26, 2019 FINDINGS: Cardiovascular: There is no demonstrable pulmonary embolus. There is no thoracic aortic aneurysm or dissection. There are scattered foci of calcification in visualized great vessels. There are foci of aortic atherosclerosis as well as several foci of coronary artery calcification. There is no pericardial effusion or pericardial thickening. Mediastinum/Nodes: Thyroid appears unremarkable. There are occasional subcentimeter mediastinal lymph nodes. There is no adenopathy by size criteria in the thoracic region. There is a focal hiatal hernia. Lungs/Pleura: There is a degree of underlying centrilobular emphysematous change. There are areas of atelectatic change in each lung base and in the posterior segment of the right upper lobe. There is no airspace consolidation. Upper Abdomen: There is a 5 mm probable cyst at the  dome of the liver on the left. A second probable cyst measuring 4 mm is noted in the anterior segment right lobe of the liver. There is apparent sludge in the gallbladder with suspected cholelithiasis, incompletely visualized. There is upper abdominal aortic atherosclerosis. There are bilateral adrenal adenomas, measuring 1.6 x 1.3 cm on the right and 2.9 x 2.7 cm on the left. Musculoskeletal: There are foci of degenerative change in the thoracic spine. There are no blastic or lytic bone lesions. There are no evident chest wall lesions. Review of the MIP images confirms the above findings. IMPRESSION: 1. No demonstrable pulmonary embolus. No thoracic aortic aneurysm or dissection. There is aortic atherosclerosis as well as foci of coronary artery calcification. 2. Underlying centrilobular emphysematous change. Areas of atelectatic change in the bases and posterior segment right upper lobe. No edema or consolidation. 3.  Focal  hiatal hernia. 4.  No evident adenopathy. 5. Sludge in gallbladder with suspected cholelithiasis, incompletely visualized. 6.  Apparent bilateral adrenal adenomas. Aortic Atherosclerosis (ICD10-I70.0) and Emphysema (ICD10-J43.9). Electronically Signed   By: Lowella Grip III M.D.   On: 02/26/2019 15:49    Scheduled Meds: . aspirin EC  325 mg Oral Daily  . benzonatate  200 mg Oral TID  . calcium carbonate  1 tablet Oral BID WC  . enoxaparin (LOVENOX) injection  40 mg Subcutaneous Q24H  . feeding supplement (ENSURE ENLIVE)  237 mL Oral TID BM  . folic acid  1 mg Oral Daily  . magnesium oxide  400 mg Oral BID  . metoprolol tartrate  25 mg Oral BID  . multivitamin with minerals  1 tablet Oral Daily  . pantoprazole  40 mg Oral Daily  . tamsulosin  0.4 mg Oral Daily  . thiamine  100 mg Oral Daily   Continuous Infusions: . azithromycin Stopped (02/26/19 2001)  . calcium gluconate 1,000 mg (02/27/19 1447)  . cefTRIAXone (ROCEPHIN)  IV 1 g (02/27/19 1448)     Assessment/Plan:  1. Acute hypoxic respiratory failure.  Patient on oxygen hopefully we can taper this to off. 2. Lobar pneumonia on Rocephin and Zithromax 3. Acute cystitis on Rocephin 4. Severe hypomagnesemia replace magnesium this morning and again this afternoon.  Phosphorus level is elevated. 5. Hypokalemia replace potassium 6. Chronic atrial fibrillation on metoprolol for rate control.  Not on any anticoagulation.  Higher risk of stroke.  The patient likely high fall risk at this point. 7. BPH on Flomax 8. Weakness.  Son states that they are looking into a long-term care after rehab.  Patient keeps on coming back in and out of the hospital frequently.  He can no longer live at home alone.  Code Status:     Code Status Orders  (From admission, onward)         Start     Ordered   02/26/19 1515  Full code  Continuous     02/26/19 1517        Code Status History    Date Active Date Inactive Code Status Order ID Comments User Context   01/25/2019 0224 01/31/2019 1742 Full Code [id number]  Elwyn Reach, MD ED   11/15/2018 1629 11/21/2018 2106 Full Code [id number]  Demetrios Loll, MD Inpatient   07/20/2015 1512 07/21/2015 1601 Full Code [id number]  Hillary Bow, MD ED   02/19/2015 0724 02/20/2015 1757 Full Code [id number]  Harrie Foreman, MD Inpatient   Advance Care Planning Activity    Advance Directive Documentation     Most Recent Value  Type of Advance Directive  Healthcare Power of Attorney  Pre-existing out of facility DNR order (yellow form or pink MOST form)  --  "MOST" Form in Place?  --     Family Communication: Spoke with son on the phone disposition Plan: Out to rehab once electrolytes better  Antibiotics:  Rocephin  Zithromax  Time spent: 28 minutes  [place]

## 2019-02-27 NOTE — Progress Notes (Addendum)
CRITICAL VALUE ALERT  Critical Value:  Magnesium 0.3  Date & Time Notied:  02/26/18 0726  Provider Notified: Dr. Leslye Peer notified   Orders Received/Actions taken: Calcium gluconate currently infusing.   Fuller Mandril, RN

## 2019-02-27 NOTE — Progress Notes (Signed)
Physical Therapy Treatment Patient Details Name: Daniel Edwards MRN: [id number] DOB: [birth year] Today's Date: 02/27/2019    History of Present Illness 85 y.o. male with a history of BPH, COPD, hypertension, GERD who presents to the ED for altered mental status and weakness.  EMS reports he has been having recurrent episodes of altered mental status recently.  He apparently called his son today and did not say anything.  He was 84% on room air then placed on 4 L nasal cannula.  He was noted to have A. fib.  MD assessment includes AMS and tachycardia.    PT Comments    Pt presented with grossly improved alertness with improved motivation to participate during the session.  Pt on 2LO2/min during the session with SpO2 measured frequently and remaining in mid 90s throughout with no adverse symptoms noted other than general fatigue.  Pt required min A with functional mobility tasks including with gait where pt required assist to address min instability.  Pt will benefit from PT services in a SNF setting upon discharge to safely address deficits listed in patient problem list for decreased caregiver assistance and eventual return to PLOF.      Follow Up Recommendations  SNF     Equipment Recommendations  None recommended by PT    Recommendations for Other Services       Precautions / Restrictions Precautions Precautions: Fall Restrictions Weight Bearing Restrictions: No    Mobility  Bed Mobility Overal bed mobility: Needs Assistance Bed Mobility: Supine to Sit;Sit to Supine     Supine to sit: Min assist Sit to supine: Min assist   General bed mobility comments: Min A for trunk control and final positioning  Transfers Overall transfer level: Needs assistance Equipment used: Rolling walker (2 wheeled) Transfers: Sit to/from Stand Sit to Stand: Min assist;From elevated surface         General transfer comment: Mod verbal cues for sequencing and min A to come to  standing  Ambulation/Gait Ambulation/Gait assistance: Min assist Gait Distance (Feet): 30 Feet x 2 Assistive device: Rolling walker (2 wheeled) Gait Pattern/deviations: Step-through pattern;Trunk flexed;Decreased step length - right;Decreased step length - left;Drifts right/left Gait velocity: decreased   General Gait Details: Slow cadence with amb with short B step length and min instability/drifting   Stairs             Wheelchair Mobility    Modified Rankin (Stroke Patients Only)       Balance Overall balance assessment: Needs assistance Sitting-balance support: Feet supported;Bilateral upper extremity supported Sitting balance-Leahy Scale: Fair     Standing balance support: Bilateral upper extremity supported;During functional activity Standing balance-Leahy Scale: Poor Standing balance comment: Min A for stability                            Cognition Arousal/Alertness: Awake/alert Behavior During Therapy: WFL for tasks assessed/performed Overall Cognitive Status: No family/caregiver present to determine baseline cognitive functioning                                        Exercises Total Joint Exercises Ankle Circles/Pumps: Both;10 reps;15 reps;AROM Quad Sets: Strengthening;Both;10 reps;15 reps Gluteal Sets: Strengthening;Both;10 reps;15 reps Short Arc Quad: Both;10 reps;15 reps;Strengthening Heel Slides: Both;10 reps;AROM Hip ABduction/ADduction: Both;10 reps;AROM;5 reps;Strengthening Straight Leg Raises: Both;10 reps;AROM;Strengthening;5 reps Long Arc Quad: AROM;Both;10 reps;Strengthening;15 reps Knee Flexion: AROM;Both;10  reps;Strengthening;15 reps Other Exercises Other Exercises: Static standing for improved activity tolerance 2 x 5 min with UE support    General Comments        Pertinent Vitals/Pain Pain Assessment: No/denies pain    Home Living                      Prior Function            PT  Goals (current goals can now be found in the care plan section) Progress towards PT goals: Progressing toward goals    Frequency    Min 2X/week      PT Plan Current plan remains appropriate    Co-evaluation              AM-PAC PT "6 Clicks" Mobility   Outcome Measure  Help needed turning from your back to your side while in a flat bed without using bedrails?: A Little Help needed moving from lying on your back to sitting on the side of a flat bed without using bedrails?: A Little Help needed moving to and from a bed to a chair (including a wheelchair)?: A Little Help needed standing up from a chair using your arms (e.g., wheelchair or bedside chair)?: A Little Help needed to walk in hospital room?: A Little Help needed climbing 3-5 steps with a railing? : A Lot 6 Click Score: 17    End of Session Equipment Utilized During Treatment: Gait belt;Oxygen Activity Tolerance: Patient tolerated treatment well Patient left: in chair;with call bell/phone within reach;with chair alarm set Nurse Communication: Mobility status PT Visit Diagnosis: Unsteadiness on feet (R26.81);Muscle weakness (generalized) (M62.81);Difficulty in walking, not elsewhere classified (R26.2)     Time: [id number] PT Time Calculation (min) (ACUTE ONLY): 38 min  Charges:  $Gait Training: 8-22 mins $Therapeutic Exercise: 23-37 mins                     D. Scott Salli Bodin PT, DPT 02/27/19, 12:00 PM

## 2019-02-27 NOTE — TOC Initial Note (Signed)
Transition of Care [facility]) - Initial/Assessment Note    Patient Details  Name: Daniel Edwards MRN: [id number] Date of Birth: Jun 07, 1934  Transition of [facility]) CM/SW Contact:    Beverly Sessions, RN Phone Number: 02/27/2019, 11:28 AM  Clinical Narrative:                 Referral sent to [facility] in [place]  Expected Discharge Plan: [place] Barriers to Discharge: Barriers Resolved   Patient Goals and CMS Choice Patient states their goals for this hospitalization and ongoing recovery are:: seeking LTC placement   Choice offered to / list presented to : Patient, Adult Children  Expected Discharge Plan and Services Expected Discharge Plan: [place]   Discharge Planning Services: CM Consult Post Acute Care Choice: [place] Living arrangements for the past 2 months: Single Family Home(in SNF until 2 weeks ago)                                      Prior Living Arrangements/Services Living arrangements for the past 2 months: Single Family Home(in SNF until 2 weeks ago) Lives with:: Self Patient language and need for interpreter reviewed:: Yes Do you feel safe going back to the place where you live?: No(Seeking LTC placement)      Need for Family Participation in Patient Care: Yes (Comment) Care giver support system in place?: Yes (comment) Current home services: DME(walker, shower chair, bedside commode) Criminal Activity/Legal Involvement Pertinent to Current Situation/Hospitalization: No - Comment as needed  Activities of Daily Living Home Assistive Devices/Equipment: Cane (specify quad or straight), Walker (specify type) ADL Screening (condition at time of admission) Patient's cognitive ability adequate to safely complete daily activities?: Yes Is the patient deaf or have difficulty hearing?: No Does the patient have difficulty seeing, even when wearing glasses/contacts?: No Does the patient have difficulty  concentrating, remembering, or making decisions?: No Patient able to express need for assistance with ADLs?: Yes Does the patient have difficulty dressing or bathing?: Yes Independently performs ADLs?: Yes (appropriate for developmental age) Does the patient have difficulty walking or climbing stairs?: Yes Weakness of Legs: Both Weakness of Arms/Hands: None  Permission Sought/Granted Permission sought to share information with : Chartered certified accountant granted to share information with : Yes, Verbal Permission Granted  Share Information with NAME: Dayton Martes           Emotional Assessment Appearance:: Appears older than stated age Attitude/Demeanor/Rapport: Engaged   Orientation: : Oriented to Self, Oriented to Place, Oriented to  Time, Oriented to Situation Alcohol / Substance Use: Tobacco Use, Alcohol Use(smokes cigs, Beer) Psych Involvement: No (comment)  Admission diagnosis:  Shortness of breath [R06.02] Hypoxia [R09.02] CAP (community acquired pneumonia) [J18.9] Patient Active Problem List   Diagnosis Date Noted  . Hypoxia 02/26/2019  . CAP (community acquired pneumonia) 02/26/2019  . Protein-calorie malnutrition, severe 01/27/2019  . Hypokalemia 01/24/2019  . Alcoholism ([place]) 01/24/2019  . ARF (acute renal failure) ([place]) 01/24/2019  . Weakness generalized 01/24/2019  . FTT (failure to thrive) in adult 01/24/2019  . Rhabdomyolysis 11/15/2018  . A-fib ([place]) 07/20/2015  . Chest pain 02/19/2015  . Atypical chest pain 02/19/2015   PCP:  Baxter Hire, MD Pharmacy:   [facility], [address] [place] [address] Phone: [phone] Fax: [phone]  [id number] - [place], [place] - [address] [place] [address] Phone: [phone] Fax: [phone]     Social Determinants of Health (SDOH) Interventions    Readmission Risk Interventions Readmission Risk  Prevention Plan 11/18/2018  Transportation Screening Complete  Some recent data might be hidden

## 2019-02-27 NOTE — Progress Notes (Signed)
Telemetry Advertising copywriter, patient had 17 runs of ventricular tachycardia. Rachael Fee, NP notified.  Received critical labs notification from lab- calcium 5.4, magnesium 0.3. NP notified of these results as well. Awaiting orders. Will continue to monitor.

## 2019-02-27 NOTE — Progress Notes (Signed)
Nurse reports asymptomatic 17 beat run of v tach k 3.0, Mag level 0.3, 4 grams ordered and repeat lab to confirm ordered calcium 5.4 - corrected to roughly 7.3 based on albumin - 1 gm calcium gluconate ordered Continue to monitor on tele

## 2019-02-27 NOTE — Progress Notes (Signed)
CRITICAL VALUE ALERT  Critical Value:  Magnesium 0.3   Calcium 5.4  Date & Time Notied:  02/27/19 [id number]  Provider Notified: Sharion Settler, NP  Orders Received/Actions taken: Orders given to administer IV magnesium sulfate. Orders followed.

## 2019-02-27 NOTE — TOC Progression Note (Signed)
Transition of Care [facility]) - Progression Note    Patient Details  Name: Daniel Edwards MRN: [id number] Date of Birth: Jun 24, 1934  Transition of Care [facility]) CM/SW Contact  Beverly Sessions, RN Phone Number: 02/27/2019, 3:50 PM  Clinical Narrative:     Per son he has spoken with Kennyth Lose at [facility] and has initiated the process of applying for Medicaid.   Bed offer received from [facility].   Present to son.  He is going to reach out to the admissions coordinator directly, discuss with his wife, then let me know their decision.   [facility] would have to have a covid test with in 72 hours of discharge   Expected Discharge Plan: [place] Barriers to Discharge: Barriers Resolved  Expected Discharge Plan and Services Expected Discharge Plan: [place]   Discharge Planning Services: CM Consult Post Acute Care Choice: [place] Living arrangements for the past 2 months: Single Family Home(in SNF until 2 weeks ago)                                       Social Determinants of Health (SDOH) Interventions    Readmission Risk Interventions Readmission Risk Prevention Plan 11/18/2018  Transportation Screening Complete  Some recent data might be hidden

## 2019-02-27 NOTE — Progress Notes (Addendum)
Initial Nutrition Assessment  DOCUMENTATION CODES:   Severe malnutrition in context of chronic illness  INTERVENTION:   Provide Ensure Enlive po TID, each supplement provides 350 kcal and 20 grams of protein. Patient prefers chocolate.  Magic cup TID with meals, each supplement provides 290 kcal and 9 grams of protein  Continue MVI daily, thiamine 123XX123 mg daily, folic acid 1 mg daily.  Pt is at high refeed risk; recommend monitor K, Mg and P labs daily until stable   NUTRITION DIAGNOSIS:   Severe Malnutrition related to chronic illness(COPD, etoh abuse) as evidenced by severe fat depletion, severe muscle depletion.  GOAL:   Patient will meet greater than or equal to 90% of their needs  MONITOR:   PO intake, Supplement acceptance, Labs, Weight trends, Skin, I & O's  REASON FOR ASSESSMENT:   Consult Assessment of nutrition requirement/status  ASSESSMENT:   85 y.o. male with medical history significant of BPH, COPD, hypertension, GERD, atrial fibrillation, chronic alcoholism, severe protein calorie malnutrition, failure to thrive who is admitted with AMS and weakness.   Pt with AMS. Pt seen by RD during recent previous admit. Pt reported at that time that he has a good appetite and oral intake at baseline. He reported eating 3-4 meals daily. His son does his grocery shopping. He reports that he typically has fish or meat with vegetables and fruits. Pt was eating 100% of meals during his last admit and has been eating fairly well in the ED. Recommend continue supplements and vitamins. RD will liberalize pt's diet as a heart healthy diet is restrictive of protein. Pt is at high refeed risk.   Medications reviewed and include: aspirin, tums, lovenox, folic acid, Mg oxide, MVI, protonix, thiamine, azithromycin, calcium gluconate   Labs reviewed: K 3.5 wnl, Ca 5.4(L) adj. 6.52(L), P 4.9(H), Mg 0.3(L), alb 2.6(L) Wbc- 15.9(H)  NUTRITION - FOCUSED PHYSICAL EXAM:    Most Recent  Value  Orbital Region  Severe depletion  Upper Arm Region  Severe depletion  Thoracic and Lumbar Region  Severe depletion  Buccal Region  Severe depletion  Temple Region  Severe depletion  Clavicle Bone Region  Severe depletion  Clavicle and Acromion Bone Region  Severe depletion  Scapular Bone Region  Severe depletion  Dorsal Hand  Severe depletion  Patellar Region  Severe depletion  Anterior Thigh Region  Severe depletion  Posterior Calf Region  Severe depletion  Edema (RD Assessment)  None  Hair  Reviewed  Eyes  Reviewed  Mouth  Reviewed  Skin  Reviewed  Nails  Reviewed     Diet Order:   Diet Order            Diet Heart Room service appropriate? Yes; Fluid consistency: Thin  Diet effective now             EDUCATION NEEDS:   Education needs have been addressed  Skin:  Skin Assessment: Reviewed RN Assessment(ecchymosis)  Last BM:  1/19  Height:   Ht Readings from Last 1 Encounters:  02/27/19 5\' 10"  (1.778 m)   Weight:   Wt Readings from Last 1 Encounters:  02/21/19 56.8 kg   Ideal Body Weight:  75.5 kg  BMI:  Body mass index is 17.96 kg/m.  Estimated Nutritional Needs:   Kcal:  1600-1800kcal/day  Protein:  80-90g/day  Fluid:  1.6-1.8L/day  Koleen Distance MS, RD, LDN Pager #- [phone] Office#- [phone] After Hours Pager: [phone]

## 2019-02-27 NOTE — TOC Progression Note (Signed)
Transition of Care [facility]) - Progression Note    Patient Details  Name: ANATOLY DORY MRN: [id number] Date of Birth: [birth year]  Transition of Care [facility]) CM/SW Contact  Beverly Sessions, RN Phone Number: 02/27/2019, 2:42 PM  Clinical Narrative:    Patient admitted to floor, after extended time in the ED  Received call from son.  He is aware that we currently have no bed offers.  He states that prior to admission patient lived at home alone  Son states that he followed with [facility] and states he does not have an outstanding balance  RNCM reached out to TIna at [facility].  She states that patient never had an "outstanding bill" the concern was that at the time "family was not willing to pay out of pocket for long term care".  Otila Kluver to call son and discuss    Expected Discharge Plan: Skilled Nursing Facility Barriers to Discharge: Barriers Resolved  Expected Discharge Plan and Services Expected Discharge Plan: [place]   Discharge Planning Services: CM Consult Post Acute Care Choice: [place] Living arrangements for the past 2 months: Single Family Home(in SNF until 2 weeks ago)                                       Social Determinants of Health (SDOH) Interventions    Readmission Risk Interventions Readmission Risk Prevention Plan 11/18/2018  Transportation Screening Complete  Some recent data might be hidden

## 2019-02-28 LAB — CBC WITH DIFFERENTIAL/PLATELET
Abs Immature Granulocytes: 0.06 10*3/uL (ref 0.00–0.07)
Basophils Absolute: 0.1 10*3/uL (ref 0.0–0.1)
Basophils Relative: 1 %
Eosinophils Absolute: 0.1 10*3/uL (ref 0.0–0.5)
Eosinophils Relative: 1 %
HCT: 30.5 % — ABNORMAL LOW (ref 39.0–52.0)
Hemoglobin: 10.2 g/dL — ABNORMAL LOW (ref 13.0–17.0)
Immature Granulocytes: 1 %
Lymphocytes Relative: 10 %
Lymphs Abs: 1 10*3/uL (ref 0.7–4.0)
MCH: 28.6 pg (ref 26.0–34.0)
MCHC: 33.4 g/dL (ref 30.0–36.0)
MCV: 85.4 fL (ref 80.0–100.0)
Monocytes Absolute: 0.8 10*3/uL (ref 0.1–1.0)
Monocytes Relative: 8 %
Neutro Abs: 7.8 10*3/uL — ABNORMAL HIGH (ref 1.7–7.7)
Neutrophils Relative %: 79 %
Platelets: 308 10*3/uL (ref 150–400)
RBC: 3.57 MIL/uL — ABNORMAL LOW (ref 4.22–5.81)
RDW: 13.7 % (ref 11.5–15.5)
WBC: 9.8 10*3/uL (ref 4.0–10.5)
nRBC: 0 % (ref 0.0–0.2)

## 2019-02-28 LAB — COMPREHENSIVE METABOLIC PANEL
ALT: 32 U/L (ref 0–44)
AST: 28 U/L (ref 15–41)
Albumin: 2.4 g/dL — ABNORMAL LOW (ref 3.5–5.0)
Alkaline Phosphatase: 54 U/L (ref 38–126)
Anion gap: 11 (ref 5–15)
BUN: 34 mg/dL — ABNORMAL HIGH (ref 8–23)
CO2: 26 mmol/L (ref 22–32)
Calcium: 6 mg/dL — CL (ref 8.9–10.3)
Chloride: 100 mmol/L (ref 98–111)
Creatinine, Ser: 1.12 mg/dL (ref 0.61–1.24)
GFR calc Af Amer: >60 mL/min (ref >60)
GFR calc non Af Amer: >60 mL/min (ref >60)
Glucose, Bld: 90 mg/dL (ref 70–99)
Potassium: 3.9 mmol/L (ref 3.5–5.1)
Sodium: 137 mmol/L (ref 135–145)
Total Bilirubin: 0.6 mg/dL (ref 0.3–1.2)
Total Protein: 5.7 g/dL — ABNORMAL LOW (ref 6.5–8.1)

## 2019-02-28 LAB — PROCALCITONIN: Procalcitonin: <0.1 ng/mL

## 2019-02-28 LAB — MAGNESIUM: Magnesium: 1.2 mg/dL — ABNORMAL LOW (ref 1.7–2.4)

## 2019-02-28 LAB — PHOSPHORUS: Phosphorus: 3.6 mg/dL (ref 2.5–4.6)

## 2019-02-28 MED ORDER — MAGNESIUM SULFATE 4 GM/100ML IV SOLN
4.0000 g | Freq: Once | INTRAVENOUS | Status: AC
  Administered 2019-02-28: 4 g via INTRAVENOUS
  Filled 2019-02-28: qty 100

## 2019-02-28 MED ORDER — SODIUM CHLORIDE 0.9 % IV SOLN
INTRAVENOUS | Status: DC | PRN
  Administered 2019-02-28: 250 mL via INTRAVENOUS

## 2019-02-28 MED ORDER — CALCIUM GLUCONATE-NACL 1-0.675 GM/50ML-% IV SOLN
1.0000 g | Freq: Once | INTRAVENOUS | Status: AC
  Administered 2019-02-28: 1000 mg via INTRAVENOUS
  Filled 2019-02-28: qty 50

## 2019-02-28 MED ORDER — MAGNESIUM SULFATE 4 GM/100ML IV SOLN
4.0000 g | Freq: Once | INTRAVENOUS | Status: AC
  Administered 2019-02-28: 4 g via INTRAVENOUS
  Filled 2019-02-28 (×2): qty 100

## 2019-02-28 NOTE — TOC Progression Note (Addendum)
Transition of Care [facility]) - Progression Note    Patient Details  Name: Daniel Edwards MRN: [id number] Date of Birth: 1934/05/17  Transition of Care [facility]) CM/SW Contact  Beverly Sessions, RN Phone Number: 02/28/2019, 10:27 AM  Clinical Narrative:     Spoke with son.  Accepted bed offer at [facility].  Accepted in the [place]  Per MD potentially discharge tomorrow pending lab work.     RNCM notified Levada Dy at [facility] of potential discharge tomorrow.  She is to start insurance auth  Expected Discharge Plan: [place] Barriers to Discharge: Barriers Resolved  Expected Discharge Plan and Services Expected Discharge Plan: [place]   Discharge Planning Services: CM Consult Post Acute Care Choice: [place] Living arrangements for the past 2 months: Single Family Home(in SNF until 2 weeks ago)                                       Social Determinants of Health (SDOH) Interventions    Readmission Risk Interventions Readmission Risk Prevention Plan 11/18/2018  Transportation Screening Complete  Some recent data might be hidden

## 2019-02-28 NOTE — Progress Notes (Signed)
Patient ID: Daniel Edwards, male   DOB: [birth year], 85 y.o.   MRN: [id number] [facility] PROGRESS NOTE  Daniel Edwards [id number] DOB: 03-26-1934 DOA: 02/18/2019 PCP: Baxter Hire, MD  HPI/Subjective: Patient eating very well.  States he does well with doing the rehab.  Was weak with physical therapy.  No nausea or vomiting.  No diarrhea.  Objective: Vitals:   02/28/19 0606 02/28/19 1131  BP: 113/73 110/72  Pulse: 73 72  Resp: 17   Temp: 97.8 F (36.6 C)   SpO2: 95%     Intake/Output Summary (Last 24 hours) at 02/28/2019 1341 Last data filed at 02/28/2019 0900 Gross per 24 hour  Intake 907.36 ml  Output 100 ml  Net 807.36 ml   Filed Weights   02/18/19 1315 02/21/19 0752  Weight: 56.8 kg 56.8 kg    ROS: Review of Systems  Constitutional: Negative for chills and fever.  Eyes: Negative for blurred vision.  Respiratory: Negative for cough and shortness of breath.   Cardiovascular: Negative for chest pain.  Gastrointestinal: Negative for abdominal pain, constipation, diarrhea, nausea and vomiting.  Genitourinary: Negative for dysuria.  Musculoskeletal: Negative for joint pain.  Neurological: Negative for dizziness and headaches.   Exam: Physical Exam  HENT:  Nose: No mucosal edema.  Mouth/Throat: No oropharyngeal exudate or posterior oropharyngeal edema.  Eyes: Conjunctivae and lids are normal.  Neck: Carotid bruit is not present.  Cardiovascular: S1 normal and S2 normal. Exam reveals no gallop.  No murmur heard. Respiratory: No respiratory distress. He has no wheezes. He has no rhonchi. He has no rales.  GI: Soft. Bowel sounds are normal. There is no abdominal tenderness.  Musculoskeletal:     Right ankle: No swelling.     Left ankle: No swelling.  Lymphadenopathy:    He has no cervical adenopathy.  Neurological: He is alert. No cranial nerve deficit.  Skin: Skin is warm. No rash noted. Nails show no clubbing.  Psychiatric: He has a normal mood and  affect.      Data Reviewed: Basic Metabolic Panel: Recent Labs  Lab 02/26/19 1352 02/27/19 0540 02/27/19 0648 02/28/19 0318  NA 135 138  --  137  K 3.5 3.5  --  3.9  CL 94* 98  --  100  CO2 26 26  --  26  GLUCOSE 151* 113*  --  90  BUN 19 17  --  34*  CREATININE 0.93 0.81  --  1.12  CALCIUM 5.4* 5.4*  --  6.0*  MG  --  0.3* 0.3* 1.2*  PHOS  --  4.9*  --  3.6   Liver Function Tests: Recent Labs  Lab 02/27/19 0540 02/28/19 0318  AST 51* 28  ALT 44 32  ALKPHOS 61 54  BILITOT 0.8 0.6  PROT 6.4* 5.7*  ALBUMIN 2.6* 2.4*   CBC: Recent Labs  Lab 02/26/19 1352 02/27/19 0540 02/28/19 0318  WBC 16.6* 15.9* 9.8  NEUTROABS 14.0* 13.4* 7.8*  HGB 11.5* 12.4* 10.2*  HCT 34.3* 36.9* 30.5*  MCV 85.8 84.4 85.4  PLT 325 344 308     Recent Results (from the past 240 hour(s))  SARS CORONAVIRUS 2 (TAT 6-24 HRS) Nasopharyngeal Nasopharyngeal Swab     Status: None   Collection Time: 02/18/19 11:39 PM   Specimen: Nasopharyngeal Swab  Result Value Ref Range Status   SARS Coronavirus 2 NEGATIVE NEGATIVE Final    Comment: (NOTE) SARS-CoV-2 target nucleic acids are NOT DETECTED. The SARS-CoV-2 RNA is generally  detectable in upper and lower respiratory specimens during the acute phase of infection. Negative results do not preclude SARS-CoV-2 infection, do not rule out co-infections with other pathogens, and should not be used as the sole basis for treatment or other patient management decisions. Negative results must be combined with clinical observations, patient history, and epidemiological information. The expected result is Negative. Fact Sheet for Patients: SugarRoll.be Fact Sheet for Healthcare Providers: https://www.woods-mathews.com/ This test is not yet approved or cleared by the [place] FDA and  has been authorized for detection and/or diagnosis of SARS-CoV-2 by FDA under an Emergency Use Authorization (EUA). This EUA  will remain  in effect (meaning this test can be used) for the duration of the COVID-19 declaration under Section 56 4(b)(1) of the Act, 21 U.S.C. section 360bbb-3(b)(1), unless the authorization is terminated or revoked sooner. Performed at [facility], [place] [address] [place], [place] [address]   Respiratory Panel by RT PCR (Flu A&B, Covid) - Nasopharyngeal Swab     Status: None   Collection Time: 02/26/19  1:53 PM   Specimen: Nasopharyngeal Swab  Result Value Ref Range Status   SARS Coronavirus 2 by RT PCR NEGATIVE NEGATIVE Final    Comment: (NOTE) SARS-CoV-2 target nucleic acids are NOT DETECTED. The SARS-CoV-2 RNA is generally detectable in upper respiratoy specimens during the acute phase of infection. The lowest concentration of SARS-CoV-2 viral copies this assay can detect is 131 copies/mL. A negative result does not preclude SARS-Cov-2 infection and should not be used as the sole basis for treatment or other patient management decisions. A negative result may occur with  improper specimen collection/handling, submission of specimen other than nasopharyngeal swab, presence of viral mutation(s) within the areas targeted by this assay, and inadequate number of viral copies (<131 copies/mL). A negative result must be combined with clinical observations, patient history, and epidemiological information. The expected result is Negative. Fact Sheet for Patients:  PinkCheek.be Fact Sheet for Healthcare Providers:  GravelBags.it This test is not yet ap proved or cleared by the [place] FDA and  has been authorized for detection and/or diagnosis of SARS-CoV-2 by FDA under an Emergency Use Authorization (EUA). This EUA will remain  in effect (meaning this test can be used) for the duration of the COVID-19 declaration under Section 564(b)(1) of the Act, 21 U.S.C. section 360bbb-3(b)(1), unless the authorization is  terminated or revoked sooner.    Influenza A by PCR NEGATIVE NEGATIVE Final   Influenza B by PCR NEGATIVE NEGATIVE Final    Comment: (NOTE) The Xpert Xpress SARS-CoV-2/FLU/RSV assay is intended as an aid in  the diagnosis of influenza from Nasopharyngeal swab specimens and  should not be used as a sole basis for treatment. Nasal washings and  aspirates are unacceptable for Xpert Xpress SARS-CoV-2/FLU/RSV  testing. Fact Sheet for Patients: PinkCheek.be Fact Sheet for Healthcare Providers: GravelBags.it This test is not yet approved or cleared by the [place] FDA and  has been authorized for detection and/or diagnosis of SARS-CoV-2 by  FDA under an Emergency Use Authorization (EUA). This EUA will remain  in effect (meaning this test can be used) for the duration of the  Covid-19 declaration under Section 564(b)(1) of the Act, 21  U.S.C. section 360bbb-3(b)(1), unless the authorization is  terminated or revoked. Performed at [facility], [address] [place], [place] [address]   Culture, blood (Routine X 2) w Reflex to ID Panel     Status: None (Preliminary result)  Collection Time: 02/26/19  4:12 PM   Specimen: BLOOD  Result Value Ref Range Status   Specimen Description BLOOD LEFT ANTECUBITAL  Final   Special Requests   Final    BOTTLES DRAWN AEROBIC AND ANAEROBIC Blood Culture adequate volume   Culture   Final    NO GROWTH 2 DAYS Performed at [facility], [address] [place], [place] [address]    Report Status PENDING  Incomplete  Culture, blood (Routine X 2) w Reflex to ID Panel     Status: None (Preliminary result)   Collection Time: 02/26/19  4:13 PM   Specimen: BLOOD  Result Value Ref Range Status   Specimen Description BLOOD BLOOD LEFT WRIST  Final   Special Requests   Final    BOTTLES DRAWN AEROBIC AND ANAEROBIC Blood Culture adequate volume   Culture   Final    NO GROWTH 2  DAYS Performed at [facility], [address], [place], [place] [address]    Report Status PENDING  Incomplete     Studies: DG Chest 2 View  Result Date: 02/26/2019 CLINICAL DATA:  Shortness of breath EXAM: CHEST - 2 VIEW COMPARISON:  February 18, 2019 FINDINGS: Lungs are clear. Heart size and pulmonary vascularity are normal. No adenopathy. There is aortic atherosclerosis. No evident bone lesions. IMPRESSION: Lungs clear. Cardiac silhouette normal. No adenopathy. Aortic Atherosclerosis (ICD10-I70.0). Electronically Signed   By: Lowella Grip III M.D.   On: 02/26/2019 14:10   CT Angio Chest PE W and/or Wo Contrast  Result Date: 02/26/2019 CLINICAL DATA:  Shortness of breath EXAM: CT ANGIOGRAPHY CHEST WITH CONTRAST TECHNIQUE: Multidetector CT imaging of the chest was performed using the standard protocol during bolus administration of intravenous contrast. Multiplanar CT image reconstructions and MIPs were obtained to evaluate the vascular anatomy. CONTRAST:  18mL OMNIPAQUE IOHEXOL 350 MG/ML SOLN COMPARISON:  Chest radiograph February 26, 2019 FINDINGS: Cardiovascular: There is no demonstrable pulmonary embolus. There is no thoracic aortic aneurysm or dissection. There are scattered foci of calcification in visualized great vessels. There are foci of aortic atherosclerosis as well as several foci of coronary artery calcification. There is no pericardial effusion or pericardial thickening. Mediastinum/Nodes: Thyroid appears unremarkable. There are occasional subcentimeter mediastinal lymph nodes. There is no adenopathy by size criteria in the thoracic region. There is a focal hiatal hernia. Lungs/Pleura: There is a degree of underlying centrilobular emphysematous change. There are areas of atelectatic change in each lung base and in the posterior segment of the right upper lobe. There is no airspace consolidation. Upper Abdomen: There is a 5 mm probable cyst at the dome of the liver on the  left. A second probable cyst measuring 4 mm is noted in the anterior segment right lobe of the liver. There is apparent sludge in the gallbladder with suspected cholelithiasis, incompletely visualized. There is upper abdominal aortic atherosclerosis. There are bilateral adrenal adenomas, measuring 1.6 x 1.3 cm on the right and 2.9 x 2.7 cm on the left. Musculoskeletal: There are foci of degenerative change in the thoracic spine. There are no blastic or lytic bone lesions. There are no evident chest wall lesions. Review of the MIP images confirms the above findings. IMPRESSION: 1. No demonstrable pulmonary embolus. No thoracic aortic aneurysm or dissection. There is aortic atherosclerosis as well as foci of coronary artery calcification. 2. Underlying centrilobular emphysematous change. Areas of atelectatic change in the bases and posterior segment right upper lobe. No edema or consolidation. 3.  Focal hiatal hernia.  4.  No evident adenopathy. 5. Sludge in gallbladder with suspected cholelithiasis, incompletely visualized. 6.  Apparent bilateral adrenal adenomas. Aortic Atherosclerosis (ICD10-I70.0) and Emphysema (ICD10-J43.9). Electronically Signed   By: Lowella Grip III M.D.   On: 02/26/2019 15:49    Scheduled Meds: . aspirin EC  325 mg Oral Daily  . benzonatate  200 mg Oral TID  . calcium carbonate  1 tablet Oral BID WC  . enoxaparin (LOVENOX) injection  40 mg Subcutaneous Q24H  . feeding supplement (ENSURE ENLIVE)  237 mL Oral TID BM  . folic acid  1 mg Oral Daily  . magnesium oxide  400 mg Oral BID  . metoprolol tartrate  25 mg Oral BID  . multivitamin with minerals  1 tablet Oral Daily  . pantoprazole  40 mg Oral Daily  . tamsulosin  0.4 mg Oral Daily  . thiamine  100 mg Oral Daily   Continuous Infusions: . azithromycin 250 mL/hr at 02/27/19 2024  . calcium gluconate    . cefTRIAXone (ROCEPHIN)  IV Stopped (02/27/19 1554)  . magnesium sulfate bolus IVPB       Assessment/Plan:  1. Acute hypoxic respiratory failure.  Patient's pulse ox is 95% on 2 L.  Check pulse ox on room air to try to get him off oxygen. 2. Lobar pneumonia on Rocephin and Zithromax 3. Acute cystitis on Rocephin 4. Severe hypomagnesemia replace magnesium this morning and again this afternoon.   5. Hypokalemia replaced. 6. Chronic atrial fibrillation on metoprolol for rate control.  Not on any anticoagulation.  Higher risk of stroke.  The patient likely high fall risk at this point. 7. BPH on Flomax 8. Weakness.  Patient will be discharged out to rehab  Code Status:     Code Status Orders  (From admission, onward)         Start     Ordered   02/26/19 1515  Full code  Continuous     02/26/19 1517        Code Status History    Date Active Date Inactive Code Status Order ID Comments User Context   01/25/2019 0224 01/31/2019 1742 Full Code [id number]  Elwyn Reach, MD ED   11/15/2018 1629 11/21/2018 2106 Full Code [id number]  Demetrios Loll, MD Inpatient   07/20/2015 1512 07/21/2015 1601 Full Code [id number]  Hillary Bow, MD ED   02/19/2015 0724 02/20/2015 1757 Full Code [id number]  Harrie Foreman, MD Inpatient   Advance Care Planning Activity    Advance Directive Documentation     Most Recent Value  Type of Advance Directive  Healthcare Power of Attorney  Pre-existing out of facility DNR order (yellow form or pink MOST form)  -  "MOST" Form in Place?  -     Family Communication: Spoke with son on the phone disposition Plan: Potentially out to rehab tomorrow if electrolytes are better.  Would also like to try to get off oxygen today.  Antibiotics:  Rocephin  Zithromax  Time spent: 27 minutes  [place]

## 2019-03-01 DIAGNOSIS — M2550 Pain in unspecified joint: Secondary | ICD-10-CM | POA: Diagnosis not present

## 2019-03-01 DIAGNOSIS — E876 Hypokalemia: Secondary | ICD-10-CM | POA: Diagnosis not present

## 2019-03-01 DIAGNOSIS — J181 Lobar pneumonia, unspecified organism: Secondary | ICD-10-CM | POA: Diagnosis not present

## 2019-03-01 DIAGNOSIS — R531 Weakness: Secondary | ICD-10-CM | POA: Diagnosis not present

## 2019-03-01 DIAGNOSIS — Z7401 Bed confinement status: Secondary | ICD-10-CM | POA: Diagnosis not present

## 2019-03-01 DIAGNOSIS — I4891 Unspecified atrial fibrillation: Secondary | ICD-10-CM | POA: Diagnosis not present

## 2019-03-01 DIAGNOSIS — I480 Paroxysmal atrial fibrillation: Secondary | ICD-10-CM

## 2019-03-01 DIAGNOSIS — N400 Enlarged prostate without lower urinary tract symptoms: Secondary | ICD-10-CM

## 2019-03-01 DIAGNOSIS — K219 Gastro-esophageal reflux disease without esophagitis: Secondary | ICD-10-CM | POA: Diagnosis not present

## 2019-03-01 DIAGNOSIS — J449 Chronic obstructive pulmonary disease, unspecified: Secondary | ICD-10-CM | POA: Diagnosis not present

## 2019-03-01 DIAGNOSIS — J9601 Acute respiratory failure with hypoxia: Secondary | ICD-10-CM | POA: Diagnosis not present

## 2019-03-01 DIAGNOSIS — R5381 Other malaise: Secondary | ICD-10-CM | POA: Diagnosis not present

## 2019-03-01 DIAGNOSIS — I10 Essential (primary) hypertension: Secondary | ICD-10-CM | POA: Diagnosis not present

## 2019-03-01 DIAGNOSIS — E43 Unspecified severe protein-calorie malnutrition: Secondary | ICD-10-CM | POA: Diagnosis not present

## 2019-03-01 DIAGNOSIS — J9690 Respiratory failure, unspecified, unspecified whether with hypoxia or hypercapnia: Secondary | ICD-10-CM | POA: Diagnosis not present

## 2019-03-01 DIAGNOSIS — I482 Chronic atrial fibrillation, unspecified: Secondary | ICD-10-CM | POA: Diagnosis not present

## 2019-03-01 DIAGNOSIS — N179 Acute kidney failure, unspecified: Secondary | ICD-10-CM | POA: Diagnosis not present

## 2019-03-01 DIAGNOSIS — N3000 Acute cystitis without hematuria: Secondary | ICD-10-CM | POA: Diagnosis not present

## 2019-03-01 DIAGNOSIS — G4700 Insomnia, unspecified: Secondary | ICD-10-CM | POA: Diagnosis not present

## 2019-03-01 DIAGNOSIS — E119 Type 2 diabetes mellitus without complications: Secondary | ICD-10-CM | POA: Diagnosis not present

## 2019-03-01 LAB — CBC WITH DIFFERENTIAL/PLATELET
Abs Immature Granulocytes: 0.08 10*3/uL — ABNORMAL HIGH (ref 0.00–0.07)
Basophils Absolute: 0 10*3/uL (ref 0.0–0.1)
Basophils Relative: 0 %
Eosinophils Absolute: 0.1 10*3/uL (ref 0.0–0.5)
Eosinophils Relative: 0 %
HCT: 29.7 % — ABNORMAL LOW (ref 39.0–52.0)
Hemoglobin: 9.7 g/dL — ABNORMAL LOW (ref 13.0–17.0)
Immature Granulocytes: 1 %
Lymphocytes Relative: 7 %
Lymphs Abs: 0.9 10*3/uL (ref 0.7–4.0)
MCH: 28.4 pg (ref 26.0–34.0)
MCHC: 32.7 g/dL (ref 30.0–36.0)
MCV: 86.8 fL (ref 80.0–100.0)
Monocytes Absolute: 0.9 10*3/uL (ref 0.1–1.0)
Monocytes Relative: 7 %
Neutro Abs: 11.8 10*3/uL — ABNORMAL HIGH (ref 1.7–7.7)
Neutrophils Relative %: 85 %
Platelets: 310 10*3/uL (ref 150–400)
RBC: 3.42 MIL/uL — ABNORMAL LOW (ref 4.22–5.81)
RDW: 13.9 % (ref 11.5–15.5)
WBC: 13.8 10*3/uL — ABNORMAL HIGH (ref 4.0–10.5)
nRBC: 0 % (ref 0.0–0.2)

## 2019-03-01 LAB — COMPREHENSIVE METABOLIC PANEL
ALT: 38 U/L (ref 0–44)
AST: 35 U/L (ref 15–41)
Albumin: 2.3 g/dL — ABNORMAL LOW (ref 3.5–5.0)
Alkaline Phosphatase: 56 U/L (ref 38–126)
Anion gap: 9 (ref 5–15)
BUN: 31 mg/dL — ABNORMAL HIGH (ref 8–23)
CO2: 26 mmol/L (ref 22–32)
Calcium: 6.8 mg/dL — ABNORMAL LOW (ref 8.9–10.3)
Chloride: 101 mmol/L (ref 98–111)
Creatinine, Ser: 0.81 mg/dL (ref 0.61–1.24)
GFR calc Af Amer: >60 mL/min (ref >60)
GFR calc non Af Amer: >60 mL/min (ref >60)
Glucose, Bld: 111 mg/dL — ABNORMAL HIGH (ref 70–99)
Potassium: 4.3 mmol/L (ref 3.5–5.1)
Sodium: 136 mmol/L (ref 135–145)
Total Bilirubin: 0.6 mg/dL (ref 0.3–1.2)
Total Protein: 5.7 g/dL — ABNORMAL LOW (ref 6.5–8.1)

## 2019-03-01 LAB — MAGNESIUM: Magnesium: 2.8 mg/dL — ABNORMAL HIGH (ref 1.7–2.4)

## 2019-03-01 MED ORDER — METOPROLOL TARTRATE 25 MG PO TABS: 25.0000 mg | ORAL_TABLET | Freq: Two times a day (BID) | ORAL | 0 refills | Status: DC

## 2019-03-01 MED ORDER — IPRATROPIUM-ALBUTEROL 20-100 MCG/ACT IN AERS: 1.0000 | INHALATION_SPRAY | Freq: Four times a day (QID) | RESPIRATORY_TRACT | 0 refills | Status: AC

## 2019-03-01 MED ORDER — CALCIUM CARBONATE ANTACID 500 MG PO CHEW: 1.0000 | CHEWABLE_TABLET | Freq: Three times a day (TID) | ORAL | 0 refills | Status: AC

## 2019-03-01 MED ORDER — MAGNESIUM OXIDE 400 (241.3 MG) MG PO TABS: 400.0000 mg | ORAL_TABLET | Freq: Every day | ORAL | Status: DC

## 2019-03-01 MED ORDER — CEFDINIR 300 MG PO CAPS: 300.0000 mg | ORAL_CAPSULE | Freq: Two times a day (BID) | ORAL | 0 refills | Status: AC

## 2019-03-01 NOTE — Care Management Important Message (Signed)
Important Message  Patient Details  Name: Daniel Edwards MRN: [id number] Date of Birth: [birth year]   Medicare Important Message Given:  Yes     [name] 03/01/2019, 11:35 AM

## 2019-03-01 NOTE — TOC Transition Note (Signed)
Transition of Care [facility]) - CM/SW Discharge Note   Patient Details  Name: Daniel Edwards MRN: [id number] Date of Birth: 06-08-1934  Transition of Care [facility]) CM/SW Contact:  Beverly Sessions, RN Phone Number: 03/01/2019, 11:06 AM   Clinical Narrative:    Patient to discharge to [facility] today DC info sent in [place] at [employer] let for son  EMS packet on chart.  Bedside RN notified    Final next level of care: Skilled Nursing Facility Barriers to Discharge: No Barriers Identified   Patient Goals and CMS Choice Patient states their goals for this hospitalization and ongoing recovery are:: seeking LTC placement   Choice offered to / list presented to : Patient, Adult Children  Discharge Placement              Patient chooses bed at: ([facility]) Patient to be transferred to facility by: EMS Name of family member notified: son brad- voicemail left Patient and family notified of of transfer: 03/01/19  Discharge Plan and Services   Discharge Planning Services: CM Consult Post Acute Care Choice: [place]                               Social Determinants of Health (SDOH) Interventions     Readmission Risk Interventions Readmission Risk Prevention Plan 03/01/2019 11/18/2018  Transportation Screening Complete Complete  PCP or Specialist Appt within 3-5 Days (No Data) -  Palliative Care Screening Not Applicable -  Medication Review (RN Care Manager) Complete -  Some recent data might be hidden

## 2019-03-01 NOTE — Progress Notes (Signed)
Daniel Edwards and O x4. VSS. Pt tolerating diet well. No complaints of nausea or vomiting. IV removed intact, prescriptions given. discharge instructions sent with patient. Patient discharged via stretcher with EMS  Allergies as of 03/01/2019   No Known Allergies     Medication List    STOP taking these medications   loperamide 2 MG capsule Commonly known as: IMODIUM     TAKE these medications   aspirin 325 MG tablet Take 325 mg by mouth daily.   calcium carbonate 500 MG chewable tablet Commonly known as: TUMS - dosed in mg elemental calcium Chew 1 tablet (200 mg of elemental calcium total) by mouth 3 (three) times daily.   cefdinir 300 MG capsule Commonly known as: OMNICEF Take 1 capsule (300 mg total) by mouth 2 (two) times daily for 6 doses.   diphenhydramine-acetaminophen 25-500 MG Tabs tablet Commonly known as: TYLENOL PM Take 1 tablet by mouth at bedtime as needed (pain or sleep).   feeding supplement (ENSURE ENLIVE) Liqd Take 237 mLs by mouth 3 (three) times daily between meals.   folic acid 1 MG tablet Commonly known as: FOLVITE Take 1 tablet (1 mg total) by mouth daily.   Ipratropium-Albuterol 20-100 MCG/ACT Aers respimat Commonly known as: COMBIVENT Inhale 1 puff into the lungs every 6 (six) hours.   magnesium oxide 400 (241.3 Mg) MG tablet Commonly known as: MAG-OX Take 1 tablet (400 mg total) by mouth daily. What changed: when to take this   metoprolol tartrate 25 MG tablet Commonly known as: LOPRESSOR Take 1 tablet (25 mg total) by mouth 2 (two) times daily. What changed: how much to take   multivitamin with minerals Tabs tablet Take 1 tablet by mouth daily.   omeprazole 20 MG capsule Commonly known as: PRILOSEC Take 20 mg by mouth daily.   tamsulosin 0.4 MG Caps capsule Commonly known as: FLOMAX Take 1 capsule (0.4 mg total) by mouth daily.   thiamine 100 MG tablet Take 1 tablet (100 mg total) by mouth daily.       Vitals:   03/01/19 0931 03/01/19 1152  BP: 124/81 122/62  Pulse: 84 72  Resp: 17   Temp: 97.6 F (36.4 C) 98.4 F (36.9 C)  SpO2: 98% 95%    Daniel Edwards

## 2019-03-01 NOTE — Discharge Summary (Signed)
[facility] [place] NAME: Daniel Edwards    MR#:  [id number]  DATE OF BIRTH:  19-Mar-1934  DATE OF ADMISSION:  02/18/2019 ADMITTING PHYSICIAN: Sidney Ace, MD  DATE OF DISCHARGE: 02/28/2019  PRIMARY CARE PHYSICIAN: Baxter Hire, MD    ADMISSION DIAGNOSIS:  Shortness of breath [R06.02] Hypoxia [R09.02] CAP (community acquired pneumonia) [J18.9]  DISCHARGE DIAGNOSIS:  Active Problems:   Atrial fibrillation, chronic (HCC)   Weakness   Hypoxia   CAP (community acquired pneumonia)   Acute respiratory failure with hypoxia ([place])   Lobar pneumonia ([place])   Acute cystitis without hematuria   Hypomagnesemia   SECONDARY DIAGNOSIS:   Past Medical History:  Diagnosis Date  . BPH (benign prostatic hyperplasia)   . COPD (chronic obstructive pulmonary disease) ([place])   . Erectile dysfunction   . Essential hypertension   . GERD (gastroesophageal reflux disease)   . Simple chronic bronchitis ([place])     HOSPITAL COURSE:   1.  Acute hypoxic respiratory failure.  The patient's pulse ox is 95% on 2 L.  I took the patient off oxygen.  Check pulse ox on room air to try to get him off the oxygen.  Patient can use oxygen for pulse ox less than 88%. 2.  Lobar pneumonia on Rocephin and Zithromax during the hospital course.  Zithromax was given in high dose so this can be discontinued upon discharge.  I will switch over to [facility] for 6 more doses upon getting out of the hospital. 3.  Acute cystitis.  Patient on Rocephin in the hospital.  Unfortunately urine culture not sent off on presentation.  Omnicef should cover. 4.  Severe hypomagnesemia.  I replaced magnesium 4 g IV twice a day for 2 days and got the magnesium in the normal range.  Continue oral magnesium once a day upon discharge. 5.  Hypokalemia.  This was replaced into the normal range. 6.  Hypocalcemia.  Patient has no signs of tetany.  Patient was given Tums and IV calcium during the  hospital course.  Patient's calcium is low at 6.8 upon discharge but his albumin is also low at 2.3.  Continue calcium supplementation as outpatient. 7.  Paroxysmal atrial fibrillation on metoprolol for rate control.  Not on any major anticoagulation with fall risk.  Continue aspirin.  The patient does have a higher risk of stroke. 8.  BPH on Flomax 9.  Weakness.  Patient will be discharged out to rehab today  Recommend checking a BMP with magnesium next week  DISCHARGE CONDITIONS:   Fair  CONSULTS OBTAINED:  None  DRUG ALLERGIES:  No Known Allergies  DISCHARGE MEDICATIONS:   Allergies as of 03/01/2019   No Known Allergies     Medication List    STOP taking these medications   loperamide 2 MG capsule Commonly known as: IMODIUM     TAKE these medications   aspirin 325 MG tablet Take 325 mg by mouth daily.   calcium carbonate 500 MG chewable tablet Commonly known as: TUMS - dosed in mg elemental calcium Chew 1 tablet (200 mg of elemental calcium total) by mouth 3 (three) times daily.   cefdinir 300 MG capsule Commonly known as: OMNICEF Take 1 capsule (300 mg total) by mouth 2 (two) times daily for 6 doses.   diphenhydramine-acetaminophen 25-500 MG Tabs tablet Commonly known as: TYLENOL PM Take 1 tablet by mouth at bedtime as needed (pain or sleep).   feeding supplement (ENSURE ENLIVE)  Liqd Take 237 mLs by mouth 3 (three) times daily between meals.   folic acid 1 MG tablet Commonly known as: FOLVITE Take 1 tablet (1 mg total) by mouth daily.   Ipratropium-Albuterol 20-100 MCG/ACT Aers respimat Commonly known as: COMBIVENT Inhale 1 puff into the lungs every 6 (six) hours.   magnesium oxide 400 (241.3 Mg) MG tablet Commonly known as: MAG-OX Take 1 tablet (400 mg total) by mouth daily. What changed: when to take this   metoprolol tartrate 25 MG tablet Commonly known as: LOPRESSOR Take 1 tablet (25 mg total) by mouth 2 (two) times daily. What changed: how much  to take   multivitamin with minerals Tabs tablet Take 1 tablet by mouth daily.   omeprazole 20 MG capsule Commonly known as: PRILOSEC Take 20 mg by mouth daily.   tamsulosin 0.4 MG Caps capsule Commonly known as: FLOMAX Take 1 capsule (0.4 mg total) by mouth daily.   thiamine 100 MG tablet Take 1 tablet (100 mg total) by mouth daily.        DISCHARGE INSTRUCTIONS:   Follow-up with Dr. At rehab 1 day  If you experience worsening of your admission symptoms, develop shortness of breath, life threatening emergency, suicidal or homicidal thoughts you must seek medical attention immediately by calling 911 or calling your MD immediately  if symptoms less severe.  You Must read complete instructions/literature along with all the possible adverse reactions/side effects for all the Medicines you take and that have been prescribed to you. Take any new Medicines after you have completely understood and accept all the possible adverse reactions/side effects.   Please note  You were cared for by a hospitalist during your hospital stay. If you have any questions about your discharge medications or the care you received while you were in the hospital after you are discharged, you can call the unit and asked to speak with the hospitalist on call if the hospitalist that took care of you is not available. Once you are discharged, your primary care physician will handle any further medical issues. Please note that NO REFILLS for any discharge medications will be authorized once you are discharged, as it is imperative that you return to your primary care physician (or establish a relationship with a primary care physician if you do not have one) for your aftercare needs so that they can reassess your need for medications and monitor your lab values.    Today   CHIEF COMPLAINT:   Chief Complaint  Patient presents with  . Weakness  . Altered Mental Status    HISTORY OF PRESENT ILLNESS:  Daniel Edwards  is a 85 y.o. male came in with altered mental status and weakness   VITAL SIGNS:  Blood pressure 128/73, pulse 79, temperature 97.8 F (36.6 C), temperature source Oral, resp. rate 18, height 5\' 10"  (1.778 m), weight 56.8 kg, SpO2 95 %.   PHYSICAL EXAMINATION:  GENERAL:  85 y.o.-year-old patient lying in the bed with no acute distress.  EYES: Pupils equal, round, reactive to light and accommodation. No scleral icterus. Extraocular muscles intact.  HEENT: Head atraumatic, normocephalic. Oropharynx and nasopharynx clear.  NECK:  Supple, no jugular venous distention. No thyroid enlargement, no tenderness.  LUNGS: Normal breath sounds bilaterally, no wheezing, rales,rhonchi or crepitation. No use of accessory muscles of respiration.  CARDIOVASCULAR: S1, S2 normal. No murmurs, rubs, or gallops.  ABDOMEN: Soft, non-tender, non-distended. Bowel sounds present. No organomegaly or mass.  EXTREMITIES: No pedal edema, cyanosis, or  clubbing.  NEUROLOGIC: Cranial nerves II through XII are intact. Muscle strength 5/5 in all extremities. Sensation intact. Gait not checked.  PSYCHIATRIC: The patient is alert and oriented x 3.  SKIN: No obvious rash, lesion, or ulcer.   DATA REVIEW:   CBC Recent Labs  Lab 03/01/19 0453  WBC 13.8*  HGB 9.7*  HCT 29.7*  PLT 310    Chemistries  Recent Labs  Lab 03/01/19 0453  NA 136  K 4.3  CL 101  CO2 26  GLUCOSE 111*  BUN 31*  CREATININE 0.81  CALCIUM 6.8*  MG 2.8*  AST 35  ALT 38  ALKPHOS 56  BILITOT 0.6    Microbiology Results  Results for orders placed or performed during the hospital encounter of 02/18/19  SARS CORONAVIRUS 2 (TAT 6-24 HRS) Nasopharyngeal Nasopharyngeal Swab     Status: None   Collection Time: 02/18/19 11:39 PM   Specimen: Nasopharyngeal Swab  Result Value Ref Range Status   SARS Coronavirus 2 NEGATIVE NEGATIVE Final    Comment: (NOTE) SARS-CoV-2 target nucleic acids are NOT DETECTED. The SARS-CoV-2 RNA is  generally detectable in upper and lower respiratory specimens during the acute phase of infection. Negative results do not preclude SARS-CoV-2 infection, do not rule out co-infections with other pathogens, and should not be used as the sole basis for treatment or other patient management decisions. Negative results must be combined with clinical observations, patient history, and epidemiological information. The expected result is Negative. Fact Sheet for Patients: SugarRoll.be Fact Sheet for Healthcare Providers: https://www.woods-mathews.com/ This test is not yet approved or cleared by the [place] FDA and  has been authorized for detection and/or diagnosis of SARS-CoV-2 by FDA under an Emergency Use Authorization (EUA). This EUA will remain  in effect (meaning this test can be used) for the duration of the COVID-19 declaration under Section 56 4(b)(1) of the Act, 21 U.S.C. section 360bbb-3(b)(1), unless the authorization is terminated or revoked sooner. Performed at [facility], [place] [address], [place], [place] [address]   Respiratory Panel by RT PCR (Flu A&B, Covid) - Nasopharyngeal Swab     Status: None   Collection Time: 02/26/19  1:53 PM   Specimen: Nasopharyngeal Swab  Result Value Ref Range Status   SARS Coronavirus 2 by RT PCR NEGATIVE NEGATIVE Final    Comment: (NOTE) SARS-CoV-2 target nucleic acids are NOT DETECTED. The SARS-CoV-2 RNA is generally detectable in upper respiratoy specimens during the acute phase of infection. The lowest concentration of SARS-CoV-2 viral copies this assay can detect is 131 copies/mL. A negative result does not preclude SARS-Cov-2 infection and should not be used as the sole basis for treatment or other patient management decisions. A negative result may occur with  improper specimen collection/handling, submission of specimen other than nasopharyngeal swab, presence of viral mutation(s)  within the areas targeted by this assay, and inadequate number of viral copies (<131 copies/mL). A negative result must be combined with clinical observations, patient history, and epidemiological information. The expected result is Negative. Fact Sheet for Patients:  PinkCheek.be Fact Sheet for Healthcare Providers:  GravelBags.it This test is not yet ap proved or cleared by the [place] FDA and  has been authorized for detection and/or diagnosis of SARS-CoV-2 by FDA under an Emergency Use Authorization (EUA). This EUA will remain  in effect (meaning this test can be used) for the duration of the COVID-19 declaration under Section 564(b)(1) of the Act, 21 U.S.C. section 360bbb-3(b)(1), unless the authorization is  terminated or revoked sooner.    Influenza A by PCR NEGATIVE NEGATIVE Final   Influenza B by PCR NEGATIVE NEGATIVE Final    Comment: (NOTE) The Xpert Xpress SARS-CoV-2/FLU/RSV assay is intended as an aid in  the diagnosis of influenza from Nasopharyngeal swab specimens and  should not be used as a sole basis for treatment. Nasal washings and  aspirates are unacceptable for Xpert Xpress SARS-CoV-2/FLU/RSV  testing. Fact Sheet for Patients: PinkCheek.be Fact Sheet for Healthcare Providers: GravelBags.it This test is not yet approved or cleared by the [place] FDA and  has been authorized for detection and/or diagnosis of SARS-CoV-2 by  FDA under an Emergency Use Authorization (EUA). This EUA will remain  in effect (meaning this test can be used) for the duration of the  Covid-19 declaration under Section 564(b)(1) of the Act, 21  U.S.C. section 360bbb-3(b)(1), unless the authorization is  terminated or revoked. Performed at [facility], [address] [place], [place] [address]   Culture, blood (Routine X 2) w Reflex to ID Panel      Status: None (Preliminary result)   Collection Time: 02/26/19  4:12 PM   Specimen: BLOOD  Result Value Ref Range Status   Specimen Description BLOOD LEFT ANTECUBITAL  Final   Special Requests   Final    BOTTLES DRAWN AEROBIC AND ANAEROBIC Blood Culture adequate volume   Culture   Final    NO GROWTH 3 DAYS Performed at [facility], [address], [place], [place] [address]    Report Status PENDING  Incomplete  Culture, blood (Routine X 2) w Reflex to ID Panel     Status: None (Preliminary result)   Collection Time: 02/26/19  4:13 PM   Specimen: BLOOD  Result Value Ref Range Status   Specimen Description BLOOD BLOOD LEFT WRIST  Final   Special Requests   Final    BOTTLES DRAWN AEROBIC AND ANAEROBIC Blood Culture adequate volume   Culture   Final    NO GROWTH 3 DAYS Performed at [facility], [address], [place], [place] [address]    Report Status PENDING  Incomplete      Management plans discussed with the patient, family and they are in agreement.  CODE STATUS:     Code Status Orders  (From admission, onward)         Start     Ordered   02/26/19 1515  Full code  Continuous     02/26/19 1517        Code Status History    Date Active Date Inactive Code Status Order ID Comments User Context   01/25/2019 0224 01/31/2019 1742 Full Code [id number]  Elwyn Reach, MD ED   11/15/2018 1629 11/21/2018 2106 Full Code [id number]  Demetrios Loll, MD Inpatient   07/20/2015 1512 07/21/2015 1601 Full Code [id number]  Hillary Bow, MD ED   02/19/2015 0724 02/20/2015 1757 Full Code [id number]  Harrie Foreman, MD Inpatient   Advance Care Planning Activity    Advance Directive Documentation     Most Recent Value  Type of Advance Directive  Healthcare Power of Attorney  Pre-existing out of facility DNR order (yellow form or pink MOST form)  --  "MOST" Form in Place?  --      TOTAL TIME TAKING CARE OF THIS PATIENT: 34 minutes.    Loletha Grayer M.D on  03/01/2019 at 7:55 AM  Between 7am to 6pm - Pager - [phone]  After 6pm go  to [email or web address] - password [email or web address]  [facility]  CC: Primary care physician; Baxter Hire, MD

## 2019-03-03 LAB — CULTURE, BLOOD (ROUTINE X 2)
Culture: NO GROWTH
Culture: NO GROWTH
Special Requests: ADEQUATE
Special Requests: ADEQUATE

## 2019-03-04 DIAGNOSIS — I10 Essential (primary) hypertension: Secondary | ICD-10-CM | POA: Diagnosis not present

## 2019-03-04 DIAGNOSIS — J9690 Respiratory failure, unspecified, unspecified whether with hypoxia or hypercapnia: Secondary | ICD-10-CM | POA: Diagnosis not present

## 2019-03-04 DIAGNOSIS — J181 Lobar pneumonia, unspecified organism: Secondary | ICD-10-CM | POA: Diagnosis not present

## 2019-03-04 DIAGNOSIS — I480 Paroxysmal atrial fibrillation: Secondary | ICD-10-CM | POA: Diagnosis not present

## 2019-03-11 DIAGNOSIS — I480 Paroxysmal atrial fibrillation: Secondary | ICD-10-CM | POA: Diagnosis not present

## 2019-03-11 DIAGNOSIS — N400 Enlarged prostate without lower urinary tract symptoms: Secondary | ICD-10-CM | POA: Diagnosis not present

## 2019-03-11 DIAGNOSIS — I10 Essential (primary) hypertension: Secondary | ICD-10-CM | POA: Diagnosis not present

## 2019-03-11 DIAGNOSIS — J181 Lobar pneumonia, unspecified organism: Secondary | ICD-10-CM | POA: Diagnosis not present

## 2019-03-18 DIAGNOSIS — I480 Paroxysmal atrial fibrillation: Secondary | ICD-10-CM | POA: Diagnosis not present

## 2019-03-18 DIAGNOSIS — I10 Essential (primary) hypertension: Secondary | ICD-10-CM | POA: Diagnosis not present

## 2019-03-18 DIAGNOSIS — J181 Lobar pneumonia, unspecified organism: Secondary | ICD-10-CM | POA: Diagnosis not present

## 2019-03-18 DIAGNOSIS — N400 Enlarged prostate without lower urinary tract symptoms: Secondary | ICD-10-CM | POA: Diagnosis not present

## 2019-03-26 DIAGNOSIS — R030 Elevated blood-pressure reading, without diagnosis of hypertension: Secondary | ICD-10-CM | POA: Diagnosis not present

## 2019-04-04 DIAGNOSIS — J449 Chronic obstructive pulmonary disease, unspecified: Secondary | ICD-10-CM | POA: Diagnosis not present

## 2019-04-04 DIAGNOSIS — J181 Lobar pneumonia, unspecified organism: Secondary | ICD-10-CM | POA: Diagnosis not present

## 2019-04-04 DIAGNOSIS — I2510 Atherosclerotic heart disease of native coronary artery without angina pectoris: Secondary | ICD-10-CM | POA: Diagnosis not present

## 2019-04-10 DIAGNOSIS — R030 Elevated blood-pressure reading, without diagnosis of hypertension: Secondary | ICD-10-CM | POA: Diagnosis not present

## 2019-04-15 DIAGNOSIS — I480 Paroxysmal atrial fibrillation: Secondary | ICD-10-CM | POA: Diagnosis not present

## 2019-04-15 DIAGNOSIS — N400 Enlarged prostate without lower urinary tract symptoms: Secondary | ICD-10-CM | POA: Diagnosis not present

## 2019-04-15 DIAGNOSIS — I10 Essential (primary) hypertension: Secondary | ICD-10-CM | POA: Diagnosis not present

## 2019-04-15 DIAGNOSIS — J181 Lobar pneumonia, unspecified organism: Secondary | ICD-10-CM | POA: Diagnosis not present

## 2019-07-16 DIAGNOSIS — H2513 Age-related nuclear cataract, bilateral: Secondary | ICD-10-CM | POA: Diagnosis not present

## 2019-08-22 DIAGNOSIS — I10 Essential (primary) hypertension: Secondary | ICD-10-CM | POA: Diagnosis not present

## 2019-08-22 DIAGNOSIS — H2511 Age-related nuclear cataract, right eye: Secondary | ICD-10-CM | POA: Diagnosis not present

## 2019-08-27 ENCOUNTER — Encounter: Payer: Self-pay | Admitting: Ophthalmology

## 2019-09-03 ENCOUNTER — Other Ambulatory Visit: Payer: Self-pay

## 2019-09-03 ENCOUNTER — Other Ambulatory Visit
Admission: RE | Admit: 2019-09-03 | Discharge: 2019-09-03 | Disposition: A | Discharge status: Home / Self Care | Payer: Medicare HMO | Source: Ambulatory Visit | Attending: Ophthalmology | Admitting: Ophthalmology

## 2019-09-03 DIAGNOSIS — Z20822 Contact with and (suspected) exposure to covid-19: Secondary | ICD-10-CM | POA: Diagnosis not present

## 2019-09-03 DIAGNOSIS — Z01812 Encounter for preprocedural laboratory examination: Secondary | ICD-10-CM | POA: Diagnosis not present

## 2019-09-03 LAB — SARS CORONAVIRUS 2 (TAT 6-24 HRS): SARS Coronavirus 2: NEGATIVE

## 2019-09-03 NOTE — Discharge Instructions (Signed)

## 2019-09-05 ENCOUNTER — Encounter
Admission: RE | Disposition: A | Discharge status: Home / Self Care | Payer: Self-pay | Source: Home / Self Care | Attending: Ophthalmology

## 2019-09-05 ENCOUNTER — Other Ambulatory Visit: Payer: Self-pay

## 2019-09-05 ENCOUNTER — Encounter: Payer: Self-pay | Admitting: Ophthalmology

## 2019-09-05 ENCOUNTER — Ambulatory Visit: Payer: Medicare HMO | Admitting: Anesthesiology

## 2019-09-05 ENCOUNTER — Ambulatory Visit
Admission: RE | Admit: 2019-09-05 | Discharge: 2019-09-05 | Disposition: A | Discharge status: Home / Self Care | Payer: Medicare HMO | Attending: Ophthalmology | Admitting: Ophthalmology

## 2019-09-05 DIAGNOSIS — F17200 Nicotine dependence, unspecified, uncomplicated: Secondary | ICD-10-CM | POA: Insufficient documentation

## 2019-09-05 DIAGNOSIS — J449 Chronic obstructive pulmonary disease, unspecified: Secondary | ICD-10-CM | POA: Diagnosis not present

## 2019-09-05 DIAGNOSIS — N400 Enlarged prostate without lower urinary tract symptoms: Secondary | ICD-10-CM | POA: Insufficient documentation

## 2019-09-05 DIAGNOSIS — H2511 Age-related nuclear cataract, right eye: Secondary | ICD-10-CM | POA: Diagnosis not present

## 2019-09-05 DIAGNOSIS — K219 Gastro-esophageal reflux disease without esophagitis: Secondary | ICD-10-CM | POA: Diagnosis not present

## 2019-09-05 DIAGNOSIS — E7800 Pure hypercholesterolemia, unspecified: Secondary | ICD-10-CM | POA: Diagnosis not present

## 2019-09-05 DIAGNOSIS — H25811 Combined forms of age-related cataract, right eye: Secondary | ICD-10-CM | POA: Diagnosis not present

## 2019-09-05 DIAGNOSIS — H2181 Floppy iris syndrome: Secondary | ICD-10-CM | POA: Diagnosis not present

## 2019-09-05 DIAGNOSIS — I10 Essential (primary) hypertension: Secondary | ICD-10-CM | POA: Insufficient documentation

## 2019-09-05 HISTORY — PX: CATARACT EXTRACTION W/PHACO: SHX586

## 2019-09-05 SURGERY — PHACOEMULSIFICATION, CATARACT, WITH IOL INSERTION
Anesthesia: Monitor Anesthesia Care | Site: Eye | Laterality: Right

## 2019-09-05 MED ORDER — BRIMONIDINE TARTRATE-TIMOLOL 0.2-0.5 % OP SOLN
OPHTHALMIC | Status: DC | PRN
  Administered 2019-09-05: 1 [drp] via OPHTHALMIC

## 2019-09-05 MED ORDER — ACETAMINOPHEN 325 MG PO TABS: 325.0000 mg | ORAL_TABLET | Freq: Once | ORAL | Status: DC

## 2019-09-05 MED ORDER — NA CHONDROIT SULF-NA HYALURON 40-17 MG/ML IO SOLN
INTRAOCULAR | Status: DC | PRN
  Administered 2019-09-05: 1 mL via INTRAOCULAR

## 2019-09-05 MED ORDER — FENTANYL CITRATE (PF) 100 MCG/2ML IJ SOLN
INTRAMUSCULAR | Status: DC | PRN
  Administered 2019-09-05: 50 ug via INTRAVENOUS

## 2019-09-05 MED ORDER — TETRACAINE 0.5 % OP SOLN OPTIME - NO CHARGE
OPHTHALMIC | Status: DC | PRN
  Administered 2019-09-05: 2 [drp] via OPHTHALMIC

## 2019-09-05 MED ORDER — LACTATED RINGERS IV SOLN: INTRAVENOUS | Status: DC

## 2019-09-05 MED ORDER — EPINEPHRINE PF 1 MG/ML IJ SOLN
INTRAOCULAR | Status: DC | PRN
  Administered 2019-09-05: 120 mL via OPHTHALMIC

## 2019-09-05 MED ORDER — PROVISC 10 MG/ML IO SOLN
INTRAOCULAR | Status: DC | PRN
  Administered 2019-09-05: 0.55 mL via INTRAOCULAR

## 2019-09-05 MED ORDER — MIDAZOLAM HCL 2 MG/2ML IJ SOLN
INTRAMUSCULAR | Status: DC | PRN
  Administered 2019-09-05: 1 mg via INTRAVENOUS

## 2019-09-05 MED ORDER — ACETAMINOPHEN 160 MG/5ML PO SOLN: 325.0000 mg | Freq: Once | ORAL | Status: DC

## 2019-09-05 MED ORDER — TETRACAINE HCL 0.5 % OP SOLN
1.0000 [drp] | OPHTHALMIC | Status: DC | PRN
  Administered 2019-09-05 (×3): 1 [drp] via OPHTHALMIC

## 2019-09-05 MED ORDER — LIDOCAINE HCL (PF) 2 % IJ SOLN
INTRAOCULAR | Status: DC | PRN
  Administered 2019-09-05: 1 mL via INTRAOCULAR

## 2019-09-05 MED ORDER — ARMC OPHTHALMIC DILATING DROPS
1.0000 "application " | OPHTHALMIC | Status: DC | PRN
  Administered 2019-09-05 (×3): 1 via OPHTHALMIC

## 2019-09-05 MED ORDER — MOXIFLOXACIN HCL 0.5 % OP SOLN
OPHTHALMIC | Status: DC | PRN
  Administered 2019-09-05: 0.2 mL via OPHTHALMIC

## 2019-09-05 SURGICAL SUPPLY — 21 items
DISSECTOR HYDRO NUCLEUS 50X22 (MISCELLANEOUS) ×10 IMPLANT
DRSG TEGADERM 2-3/8X2-3/4 SM (GAUZE/BANDAGES/DRESSINGS) ×3 IMPLANT
GLOVE BIOGEL PI IND STRL 8 (GLOVE) ×1 IMPLANT
GLOVE BIOGEL PI INDICATOR 8 (GLOVE) ×2
GOWN STRL REUS W/ TWL LRG LVL3 (GOWN DISPOSABLE) ×1 IMPLANT
GOWN STRL REUS W/ TWL XL LVL3 (GOWN DISPOSABLE) ×1 IMPLANT
GOWN STRL REUS W/TWL LRG LVL3 (GOWN DISPOSABLE) ×3
GOWN STRL REUS W/TWL XL LVL3 (GOWN DISPOSABLE) ×3
KNIFE 45D UP 2.3 (MISCELLANEOUS) ×3 IMPLANT
LENS IOL DIOP 22.0 (Intraocular Lens) ×3 IMPLANT
LENS IOL TECNIS MONO 22.0 (Intraocular Lens) IMPLANT
MARKER SKIN DUAL TIP RULER LAB (MISCELLANEOUS) ×3 IMPLANT
NDL CAPSULORHEX 25GA (NEEDLE) ×1 IMPLANT
NEEDLE CAPSULORHEX 25GA (NEEDLE) ×3 IMPLANT
PACK CATARACT (MISCELLANEOUS) ×3 IMPLANT
PACK DR. KING ARMS (PACKS) ×3 IMPLANT
PACK EYE AFTER SURG (MISCELLANEOUS) ×3 IMPLANT
RING MALYGIN 7.0 (MISCELLANEOUS) ×2 IMPLANT
SOLUTION OPHTHALMIC SALT (MISCELLANEOUS) ×3 IMPLANT
WATER STERILE IRR 250ML POUR (IV SOLUTION) ×3 IMPLANT
WIPE NON LINTING 3.25X3.25 (MISCELLANEOUS) ×3 IMPLANT

## 2019-09-05 NOTE — Op Note (Signed)
  PREOPERATIVE DIAGNOSIS:  Nuclear sclerotic cataract of the RIGHT eye.   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   OPERATIVE PROCEDURE: Cataract surgery OD   SURGEON:  Marchia Meiers, MD.   ANESTHESIA:  Anesthesiologist: Ronelle Nigh, MD CRNA: Cameron Ali, CRNA  1.      Managed anesthesia care. 2.     0.33ml of Shugarcaine was instilled following the paracentesis   COMPLICATIONS:  None.   TECHNIQUE:   Divide and conquer with use of malyugin ring.   DESCRIPTION OF PROCEDURE:  The patient was examined and consented in the preoperative holding area where the aforementioned topical anesthesia was applied to the RIGHT eye and then brought back to the Operating Room where the RIGHT eye was prepped and draped in the usual sterile ophthalmic fashion and a lid speculum was placed. A paracentesis was created with the side port blade, the anterior chamber was washed out with trypan blue to stain the anterior capsule, and the anterior chamber was filled with viscoelastic. A near clear corneal incision was performed with the steel keratome. A malyugin ring was inserted and removed at the end of the case without complication. A continuous curvilinear capsulorrhexis was performed with a cystotome followed by the capsulorrhexis forceps. Hydrodissection and hydrodelineation were carried out with BSS on a blunt cannula. The lens was removed in a divide and conquer  technique and the remaining cortical material was removed with the irrigation-aspiration handpiece. The capsular bag was inflated with viscoelastic and the lens was placed in the capsular bag without complication. The remaining viscoelastic was removed from the eye with the irrigation-aspiration handpiece. The wounds were hydrated. The anterior chamber was flushed and the eye was inflated to physiologic pressure. 0.50ml Vigamox was placed in the anterior chamber. The  wounds were found to be water tight. The eye was dressed with Vigamox. The patient was given protective glasses to wear throughout the day and a shield with which to sleep tonight. The patient was also given drops with which to begin a drop regimen today and will follow-up with me in one day. Implant Name Type Inv. Item Serial No. Manufacturer Lot No. LRB No. Used Action  LENS IOL DIOP 22.0 - [id number] Intraocular Lens LENS IOL DIOP 22.0 [id number] [employer]  Right 1 Implanted    Procedure(s): CATARACT EXTRACTION PHACO AND INTRAOCULAR LENS PLACEMENT (IOC) RIGHT MALYUGIN  20.18  01:29.1 (Right)  Electronically signed: Marchia Meiers 09/05/2019 12:58 PM

## 2019-09-05 NOTE — H&P (Signed)
   I have reviewed the patient's H&P and agree with its findings. There have been no interval changes.  Anaih Brander MD Ophthalmology 

## 2019-09-05 NOTE — Anesthesia Procedure Notes (Signed)
Procedure Name: MAC Date/Time: 09/05/2019 12:15 PM Performed by: Cameron Ali, CRNA Pre-anesthesia Checklist: Patient identified, Emergency Drugs available, Suction available, Timeout performed and Patient being monitored Patient Re-evaluated:Patient Re-evaluated prior to induction Oxygen Delivery Method: Nasal cannula Placement Confirmation: positive ETCO2

## 2019-09-05 NOTE — Anesthesia Postprocedure Evaluation (Signed)
Anesthesia Post Note  Patient: Daniel Edwards  Procedure(s) Performed: CATARACT EXTRACTION PHACO AND INTRAOCULAR LENS PLACEMENT (IOC) RIGHT MALYUGIN  20.18  01:29.1 (Right Eye)     Patient location during evaluation: PACU Anesthesia Type: MAC Level of consciousness: awake and alert and oriented Pain management: satisfactory to patient Vital Signs Assessment: post-procedure vital signs reviewed and stable Respiratory status: spontaneous breathing, nonlabored ventilation and respiratory function stable Cardiovascular status: blood pressure returned to baseline and stable Postop Assessment: Adequate PO intake and No signs of nausea or vomiting Anesthetic complications: no   No complications documented.  Raliegh Ip

## 2019-09-05 NOTE — Transfer of Care (Signed)
Immediate Anesthesia Transfer of Care Note  Patient: Daniel Edwards  Procedure(s) Performed: CATARACT EXTRACTION PHACO AND INTRAOCULAR LENS PLACEMENT (IOC) RIGHT MALYUGIN  20.18  01:29.1 (Right Eye)  Patient Location: PACU  Anesthesia Type: MAC  Level of Consciousness: awake, alert  and patient cooperative  Airway and Oxygen Therapy: Patient Spontanous Breathing and Patient connected to supplemental oxygen  Post-op Assessment: Post-op Vital signs reviewed, Patient's Cardiovascular Status Stable, Respiratory Function Stable, Patent Airway and No signs of Nausea or vomiting  Post-op Vital Signs: Reviewed and stable  Complications: No complications documented.

## 2019-09-05 NOTE — Anesthesia Preprocedure Evaluation (Signed)
Anesthesia Evaluation  Patient identified by MRN, date of birth, ID band Patient awake    Reviewed: Allergy & Precautions, H&P , NPO status , Patient's Chart, lab work & pertinent test results  Airway Mallampati: II  TM Distance: >3 FB Neck ROM: full    Dental  (+) Poor Dentition, Missing   Pulmonary COPD,  COPD inhaler, Current SmokerPatient did not abstain from smoking.,    Pulmonary exam normal breath sounds clear to auscultation       Cardiovascular hypertension, Normal cardiovascular exam Rhythm:regular Rate:Normal     Neuro/Psych    GI/Hepatic GERD  ,  Endo/Other    Renal/GU      Musculoskeletal   Abdominal   Peds  Hematology   Anesthesia Other Findings   Reproductive/Obstetrics                             Anesthesia Physical Anesthesia Plan  ASA: III  Anesthesia Plan: MAC   Post-op Pain Management:    Induction:   PONV Risk Score and Plan: 1 and Treatment may vary due to age or medical condition and TIVA  Airway Management Planned:   Additional Equipment:   Intra-op Plan:   Post-operative Plan:   Informed Consent: I have reviewed the patients History and Physical, chart, labs and discussed the procedure including the risks, benefits and alternatives for the proposed anesthesia with the patient or authorized representative who has indicated his/her understanding and acceptance.     Dental Advisory Given  Plan Discussed with: CRNA  Anesthesia Plan Comments:         Anesthesia Quick Evaluation

## 2019-10-22 DIAGNOSIS — H2512 Age-related nuclear cataract, left eye: Secondary | ICD-10-CM | POA: Diagnosis not present

## 2019-10-22 DIAGNOSIS — I10 Essential (primary) hypertension: Secondary | ICD-10-CM | POA: Diagnosis not present

## 2019-10-28 ENCOUNTER — Encounter: Payer: Self-pay | Admitting: Ophthalmology

## 2019-11-05 ENCOUNTER — Other Ambulatory Visit: Payer: Self-pay

## 2019-11-05 ENCOUNTER — Other Ambulatory Visit
Admission: RE | Admit: 2019-11-05 | Discharge: 2019-11-05 | Disposition: A | Discharge status: Home / Self Care | Payer: Medicare HMO | Source: Ambulatory Visit | Attending: Ophthalmology | Admitting: Ophthalmology

## 2019-11-05 DIAGNOSIS — Z01812 Encounter for preprocedural laboratory examination: Secondary | ICD-10-CM | POA: Diagnosis not present

## 2019-11-05 DIAGNOSIS — Z20822 Contact with and (suspected) exposure to covid-19: Secondary | ICD-10-CM | POA: Insufficient documentation

## 2019-11-05 LAB — SARS CORONAVIRUS 2 (TAT 6-24 HRS): SARS Coronavirus 2: NEGATIVE

## 2019-11-05 NOTE — Discharge Instructions (Signed)

## 2019-11-07 ENCOUNTER — Encounter
Admission: RE | Disposition: A | Discharge status: Home / Self Care | Payer: Self-pay | Source: Home / Self Care | Attending: Ophthalmology

## 2019-11-07 ENCOUNTER — Other Ambulatory Visit: Payer: Self-pay

## 2019-11-07 ENCOUNTER — Ambulatory Visit: Payer: Medicare HMO | Admitting: Anesthesiology

## 2019-11-07 ENCOUNTER — Ambulatory Visit
Admission: RE | Admit: 2019-11-07 | Discharge: 2019-11-07 | Disposition: A | Discharge status: Home / Self Care | Payer: Medicare HMO | Attending: Ophthalmology | Admitting: Ophthalmology

## 2019-11-07 ENCOUNTER — Encounter: Payer: Self-pay | Admitting: Ophthalmology

## 2019-11-07 DIAGNOSIS — Z79899 Other long term (current) drug therapy: Secondary | ICD-10-CM | POA: Diagnosis not present

## 2019-11-07 DIAGNOSIS — S0522XA Ocular laceration and rupture with prolapse or loss of intraocular tissue, left eye, initial encounter: Secondary | ICD-10-CM | POA: Diagnosis not present

## 2019-11-07 DIAGNOSIS — Z7982 Long term (current) use of aspirin: Secondary | ICD-10-CM | POA: Insufficient documentation

## 2019-11-07 DIAGNOSIS — F17200 Nicotine dependence, unspecified, uncomplicated: Secondary | ICD-10-CM | POA: Insufficient documentation

## 2019-11-07 DIAGNOSIS — I10 Essential (primary) hypertension: Secondary | ICD-10-CM | POA: Diagnosis not present

## 2019-11-07 DIAGNOSIS — N400 Enlarged prostate without lower urinary tract symptoms: Secondary | ICD-10-CM | POA: Diagnosis not present

## 2019-11-07 DIAGNOSIS — K219 Gastro-esophageal reflux disease without esophagitis: Secondary | ICD-10-CM | POA: Diagnosis not present

## 2019-11-07 DIAGNOSIS — H2181 Floppy iris syndrome: Secondary | ICD-10-CM | POA: Insufficient documentation

## 2019-11-07 DIAGNOSIS — H2512 Age-related nuclear cataract, left eye: Secondary | ICD-10-CM | POA: Insufficient documentation

## 2019-11-07 DIAGNOSIS — X58XXXA Exposure to other specified factors, initial encounter: Secondary | ICD-10-CM | POA: Insufficient documentation

## 2019-11-07 DIAGNOSIS — J449 Chronic obstructive pulmonary disease, unspecified: Secondary | ICD-10-CM | POA: Diagnosis not present

## 2019-11-07 DIAGNOSIS — H25812 Combined forms of age-related cataract, left eye: Secondary | ICD-10-CM | POA: Diagnosis not present

## 2019-11-07 HISTORY — PX: CATARACT EXTRACTION W/PHACO: SHX586

## 2019-11-07 HISTORY — PX: ANTERIOR VITRECTOMY: SHX1173

## 2019-11-07 SURGERY — PHACOEMULSIFICATION, CATARACT, WITH IOL INSERTION
Anesthesia: Monitor Anesthesia Care | Site: Eye | Laterality: Left

## 2019-11-07 MED ORDER — EPINEPHRINE PF 1 MG/ML IJ SOLN
INTRAOCULAR | Status: DC | PRN
  Administered 2019-11-07: 144 mL via OPHTHALMIC

## 2019-11-07 MED ORDER — NA CHONDROIT SULF-NA HYALURON 40-17 MG/ML IO SOLN
INTRAOCULAR | Status: DC | PRN
  Administered 2019-11-07 (×3): 1 mL via INTRAOCULAR

## 2019-11-07 MED ORDER — LIDOCAINE HCL (PF) 2 % IJ SOLN
INTRAOCULAR | Status: DC | PRN
  Administered 2019-11-07: 4 mL via INTRAOCULAR
  Administered 2019-11-07: 2 mL via INTRAOCULAR

## 2019-11-07 MED ORDER — MOXIFLOXACIN HCL 0.5 % OP SOLN
OPHTHALMIC | Status: DC | PRN
  Administered 2019-11-07: 0.2 mL via OPHTHALMIC

## 2019-11-07 MED ORDER — TETRACAINE HCL 0.5 % OP SOLN
OPHTHALMIC | Status: DC | PRN
  Administered 2019-11-07 (×2): 2 [drp] via OPHTHALMIC

## 2019-11-07 MED ORDER — ACETAMINOPHEN 160 MG/5ML PO SOLN: 325.0000 mg | Freq: Once | ORAL | Status: DC

## 2019-11-07 MED ORDER — PROVISC 10 MG/ML IO SOLN
INTRAOCULAR | Status: DC | PRN
  Administered 2019-11-07: 0.55 mL via INTRAOCULAR

## 2019-11-07 MED ORDER — LACTATED RINGERS IV SOLN: INTRAVENOUS | Status: DC

## 2019-11-07 MED ORDER — ARMC OPHTHALMIC DILATING DROPS
1.0000 "application " | OPHTHALMIC | Status: DC | PRN
  Administered 2019-11-07 (×3): 1 via OPHTHALMIC

## 2019-11-07 MED ORDER — SODIUM HYALURONATE 23 MG/ML IO SOLN
INTRAOCULAR | Status: DC | PRN
  Administered 2019-11-07 (×3): 0.6 mL via INTRAOCULAR

## 2019-11-07 MED ORDER — BRIMONIDINE TARTRATE-TIMOLOL 0.2-0.5 % OP SOLN
OPHTHALMIC | Status: DC | PRN
  Administered 2019-11-07: 1 [drp] via OPHTHALMIC

## 2019-11-07 MED ORDER — MIDAZOLAM HCL 2 MG/2ML IJ SOLN
INTRAMUSCULAR | Status: DC | PRN
  Administered 2019-11-07 (×2): 1 mg via INTRAVENOUS

## 2019-11-07 MED ORDER — ACETAMINOPHEN 325 MG PO TABS: 325.0000 mg | ORAL_TABLET | Freq: Once | ORAL | Status: DC

## 2019-11-07 MED ORDER — FENTANYL CITRATE (PF) 100 MCG/2ML IJ SOLN
INTRAMUSCULAR | Status: DC | PRN
Start: 2019-11-07 — End: 2019-11-07
  Administered 2019-11-07 (×4): 25 ug via INTRAVENOUS

## 2019-11-07 MED ORDER — NEOMYCIN-POLYMYXIN-DEXAMETH 3.5-10000-0.1 OP OINT
TOPICAL_OINTMENT | OPHTHALMIC | Status: DC | PRN
  Administered 2019-11-07: 1 via OPHTHALMIC

## 2019-11-07 MED ORDER — TRIAMCINOLONE ACETONIDE 40 MG/ML IO SUSP
INTRAOCULAR | Status: DC | PRN
  Administered 2019-11-07: 40 mg

## 2019-11-07 MED ORDER — TETRACAINE HCL 0.5 % OP SOLN
1.0000 [drp] | OPHTHALMIC | Status: DC | PRN
  Administered 2019-11-07 (×3): 1 [drp] via OPHTHALMIC

## 2019-11-07 MED ORDER — TRYPAN BLUE 0.06 % OP SOLN
OPHTHALMIC | Status: DC | PRN
  Administered 2019-11-07: 0.5 mL via INTRAOCULAR

## 2019-11-07 MED ORDER — TETRACAINE 0.5 % OP SOLN OPTIME - NO CHARGE
OPHTHALMIC | Status: DC | PRN
  Administered 2019-11-07: 2 [drp] via OPHTHALMIC

## 2019-11-07 SURGICAL SUPPLY — 30 items
CARTRIDGE IOL B-STYLE MONARCH (MISCELLANEOUS) ×2 IMPLANT
DISSECTOR HYDRO NUCLEUS 50X22 (MISCELLANEOUS) ×12 IMPLANT
DRSG TEGADERM 2-3/8X2-3/4 SM (GAUZE/BANDAGES/DRESSINGS) ×3 IMPLANT
GLOVE BIOGEL PI IND STRL 8 (GLOVE) ×1 IMPLANT
GLOVE BIOGEL PI INDICATOR 8 (GLOVE) ×2
GOWN STRL REUS W/ TWL LRG LVL3 (GOWN DISPOSABLE) ×1 IMPLANT
GOWN STRL REUS W/ TWL XL LVL3 (GOWN DISPOSABLE) ×1 IMPLANT
GOWN STRL REUS W/TWL LRG LVL3 (GOWN DISPOSABLE) ×3
GOWN STRL REUS W/TWL XL LVL3 (GOWN DISPOSABLE) ×3
KIT CUTTING IOL (CUTTING FORCEPS) ×2 IMPLANT
KNIFE 45D UP 2.3 (MISCELLANEOUS) ×3 IMPLANT
LENS IOL ACRSF MP 21.0 (Intraocular Lens) IMPLANT
LENS IOL ACRYSOF POST 21.0 (Intraocular Lens) ×3 IMPLANT
LENS IOL DIOP 22.0 (Intraocular Lens) IMPLANT
LENS IOL TECNIS MONO 22.0 (Intraocular Lens) IMPLANT
MARKER SKIN DUAL TIP RULER LAB (MISCELLANEOUS) ×3 IMPLANT
NDL CAPSULORHEX 25GA (NEEDLE) ×1 IMPLANT
NEEDLE CAPSULORHEX 25GA (NEEDLE) ×3 IMPLANT
PACK CATARACT (MISCELLANEOUS) ×3 IMPLANT
PACK DR. KING ARMS (PACKS) ×3 IMPLANT
PACK EYE AFTER SURG (MISCELLANEOUS) ×3 IMPLANT
PACK VIT ANT 23G (MISCELLANEOUS) ×2 IMPLANT
RING MALYGIN 7.0 (MISCELLANEOUS) ×2 IMPLANT
SOLUTION OPHTHALMIC SALT (MISCELLANEOUS) ×3 IMPLANT
SPONGE SURG I SPEAR (MISCELLANEOUS) ×4 IMPLANT
SUT ETHILON 10-0 CS-B-6CS-B-6 (SUTURE) ×3
SUTURE EHLN 10-0 CS-B-6CS-B-6 (SUTURE) IMPLANT
WATER STERILE IRR 250ML POUR (IV SOLUTION) ×3 IMPLANT
WICK EYE OCUCEL (MISCELLANEOUS) ×2 IMPLANT
WIPE NON LINTING 3.25X3.25 (MISCELLANEOUS) ×3 IMPLANT

## 2019-11-07 NOTE — Anesthesia Preprocedure Evaluation (Signed)
Anesthesia Evaluation  Patient identified by MRN, date of birth, ID band Patient awake    Reviewed: Allergy & Precautions, H&P , NPO status , Patient's Chart, lab work & pertinent test results  Airway Mallampati: II  TM Distance: >3 FB Neck ROM: full    Dental no notable dental hx. (+) Poor Dentition, Missing   Pulmonary COPD,  COPD inhaler, Current SmokerPatient did not abstain from smoking.,    Pulmonary exam normal breath sounds clear to auscultation       Cardiovascular hypertension, Normal cardiovascular exam Rhythm:regular Rate:Normal     Neuro/Psych    GI/Hepatic GERD  ,  Endo/Other    Renal/GU      Musculoskeletal   Abdominal   Peds  Hematology   Anesthesia Other Findings   Reproductive/Obstetrics                             Anesthesia Physical  Anesthesia Plan  ASA: III  Anesthesia Plan: MAC   Post-op Pain Management:    Induction:   PONV Risk Score and Plan: 1 and Treatment may vary due to age or medical condition  Airway Management Planned:   Additional Equipment:   Intra-op Plan:   Post-operative Plan:   Informed Consent: I have reviewed the patients History and Physical, chart, labs and discussed the procedure including the risks, benefits and alternatives for the proposed anesthesia with the patient or authorized representative who has indicated his/her understanding and acceptance.     Dental Advisory Given  Plan Discussed with: CRNA  Anesthesia Plan Comments:         Anesthesia Quick Evaluation

## 2019-11-07 NOTE — Anesthesia Procedure Notes (Signed)
Date/Time: 11/07/2019 8:22 AM Performed by: Dionne Bucy, CRNA Oxygen Delivery Method: Nasal cannula Placement Confirmation: CO2 detector

## 2019-11-07 NOTE — Anesthesia Postprocedure Evaluation (Signed)
Anesthesia Post Note  Patient: Daniel Edwards  Procedure(s) Performed: CATARACT EXTRACTION PHACO AND INTRAOCULAR LENS PLACEMENT (IOC) LEFT VISION BLUE 32.39 02:29.5  (Left Eye) ANTERIOR VITRECTOMY (Left Eye)     Patient location during evaluation: PACU Anesthesia Type: MAC Level of consciousness: awake and alert and oriented Pain management: satisfactory to patient Vital Signs Assessment: post-procedure vital signs reviewed and stable Respiratory status: spontaneous breathing, nonlabored ventilation and respiratory function stable Cardiovascular status: blood pressure returned to baseline and stable Postop Assessment: Adequate PO intake and No signs of nausea or vomiting Anesthetic complications: no   No complications documented.  Raliegh Ip

## 2019-11-07 NOTE — Op Note (Signed)
  PREOPERATIVE DIAGNOSIS:  Nuclear sclerotic cataract of the LEFT eye.   POSTOPERATIVE DIAGNOSIS:  Nuclear sclerotic cataract of the LEFT eye.   OPERATIVE PROCEDURE: Cataract surgery OS with insertion of malyugin ring and anterior vitrectomy.   SURGEON:  Marchia Meiers, MD.   ANESTHESIA:  Anesthesiologist: Ronelle Nigh, MD CRNA: Dionne Bucy, CRNA; Cameron Ali, CRNA  1.      Managed anesthesia care. 2.     0.58ml of Shugarcaine was instilled following the paracentesis   COMPLICATIONS:  Floppy iris syndrome, iris prolapse, posterior capsule rupture, loss of vitreous, anterior vitrectomy.   TECHNIQUE:   Divide and conquer   DESCRIPTION OF PROCEDURE:  The patient was examined and consented in the preoperative holding area where the aforementioned topical anesthesia was applied to the LEFT eye and then brought back to the Operating Room where the left eye was prepped and draped in the usual sterile ophthalmic fashion and a lid speculum was placed. A paracentesis was created with the side port blade, the anterior chamber was washed out with trypan blue to stain the anterior capsule, and the anterior chamber was filled with viscoelastic. A near clear corneal incision was performed with the steel keratome.  Because of the patient's use of Flomax, a malyugin ring was inserted in the usual fashion and removed at the end of the case without complication.  A continuous curvilinear capsulorrhexis was performed with a cystotome followed by the capsulorrhexis forceps. Hydrodissection and hydrodelineation were carried out with BSS on a blunt cannula. The lens was removed in a divide and conquer  technique.  At the point, it was noted that the posterior capsule had torn.  Viscoelastic was injected to tamponade the vitreous, and the remaining cortical material was removed with the irrigation-aspiration handpiece.   A 3 piece lens was inserted by extending the main wound and inserting the lens.  During  insertion and manipulation of the lens, the iris began to prolapse from the main wound, and later the paracentesis.  Healon 5 was used to push the prolapsed iris back into place.  2 sutures were placed in the main wound, a new paracentesis was made, triamcinolone was injected to visualize anterior vitreous, and an anterior vitrectomy was performed.  After the anterior vitrectomy, a suture was placed in the paracentesis through which the iris had prolapsed.  A third paracentesis was made to release pressure.  The wounds were hydrated.Marland Kitchen 0.52ml Vigamox was placed in the anterior chamber. The wounds were found to be water tight, with no protruding vitreous or iris. The eye was dressed with maxitrol and a patch placed over the eye. The patient will follow-up with me in one day. Implant Name Type Inv. Item Serial No. Manufacturer Lot No. LRB No. Used Action  LENS IOL DIOP 22.0 - [id number] Intraocular Lens LENS IOL DIOP 22.0 [id number] JOHNSON   Left 1 Wasted  LENS IOL POST 21.0 - [id number] Intraocular Lens LENS IOL POST 21.0 [id number] ALCON  Left 1 Implanted    Procedure(s): CATARACT EXTRACTION PHACO AND INTRAOCULAR LENS PLACEMENT (IOC) LEFT VISION BLUE 32.39 02:29.5  (Left) ANTERIOR VITRECTOMY (Left)  Electronically signed: Marchia Meiers 11/07/2019 10:58 AM

## 2019-11-07 NOTE — Transfer of Care (Signed)
Immediate Anesthesia Transfer of Care Note  Patient: Daniel Edwards  Procedure(s) Performed: CATARACT EXTRACTION PHACO AND INTRAOCULAR LENS PLACEMENT ([facility]) LEFT VISION BLUE 32.39 02:29.5  (Left Eye) ANTERIOR VITRECTOMY (Left Eye)  Patient Location: PACU  Anesthesia Type: MAC  Level of Consciousness: awake, alert  and patient cooperative  Airway and Oxygen Therapy: Patient Spontanous Breathing and Patient connected to supplemental oxygen  Post-op Assessment: Post-op Vital signs reviewed, Patient's Cardiovascular Status Stable, Respiratory Function Stable, Patent Airway and No signs of Nausea or vomiting  Post-op Vital Signs: Reviewed and stable  Complications: No complications documented.

## 2019-11-07 NOTE — H&P (Signed)
   I have reviewed the patient's H&P and agree with its findings. There have been no interval changes.  Danyelle Brookover MD Ophthalmology 

## 2020-01-07 DIAGNOSIS — I10 Essential (primary) hypertension: Secondary | ICD-10-CM | POA: Diagnosis not present

## 2020-01-13 ENCOUNTER — Other Ambulatory Visit: Payer: Self-pay

## 2020-01-13 ENCOUNTER — Inpatient Hospital Stay
Admission: EM | Admit: 2020-01-13 | Discharge: 2020-01-15 | DRG: 641 | Disposition: A | Discharge status: Home / Self Care | Payer: Medicare HMO | Attending: Internal Medicine | Admitting: Internal Medicine

## 2020-01-13 ENCOUNTER — Encounter: Payer: Self-pay | Admitting: Emergency Medicine

## 2020-01-13 DIAGNOSIS — J449 Chronic obstructive pulmonary disease, unspecified: Secondary | ICD-10-CM | POA: Diagnosis not present

## 2020-01-13 DIAGNOSIS — K5900 Constipation, unspecified: Secondary | ICD-10-CM | POA: Diagnosis present

## 2020-01-13 DIAGNOSIS — F17210 Nicotine dependence, cigarettes, uncomplicated: Secondary | ICD-10-CM | POA: Diagnosis present

## 2020-01-13 DIAGNOSIS — I10 Essential (primary) hypertension: Secondary | ICD-10-CM | POA: Diagnosis present

## 2020-01-13 DIAGNOSIS — Z7982 Long term (current) use of aspirin: Secondary | ICD-10-CM

## 2020-01-13 DIAGNOSIS — R350 Frequency of micturition: Secondary | ICD-10-CM | POA: Diagnosis not present

## 2020-01-13 DIAGNOSIS — D72829 Elevated white blood cell count, unspecified: Secondary | ICD-10-CM | POA: Diagnosis not present

## 2020-01-13 DIAGNOSIS — N401 Enlarged prostate with lower urinary tract symptoms: Secondary | ICD-10-CM | POA: Diagnosis not present

## 2020-01-13 DIAGNOSIS — I4821 Permanent atrial fibrillation: Secondary | ICD-10-CM | POA: Diagnosis not present

## 2020-01-13 DIAGNOSIS — E878 Other disorders of electrolyte and fluid balance, not elsewhere classified: Secondary | ICD-10-CM | POA: Diagnosis not present

## 2020-01-13 DIAGNOSIS — E876 Hypokalemia: Secondary | ICD-10-CM

## 2020-01-13 DIAGNOSIS — Z79899 Other long term (current) drug therapy: Secondary | ICD-10-CM

## 2020-01-13 DIAGNOSIS — Z720 Tobacco use: Secondary | ICD-10-CM | POA: Diagnosis present

## 2020-01-13 DIAGNOSIS — F1020 Alcohol dependence, uncomplicated: Secondary | ICD-10-CM | POA: Diagnosis not present

## 2020-01-13 DIAGNOSIS — N400 Enlarged prostate without lower urinary tract symptoms: Secondary | ICD-10-CM | POA: Diagnosis present

## 2020-01-13 DIAGNOSIS — R0602 Shortness of breath: Secondary | ICD-10-CM | POA: Diagnosis not present

## 2020-01-13 DIAGNOSIS — F1011 Alcohol abuse, in remission: Secondary | ICD-10-CM | POA: Diagnosis not present

## 2020-01-13 DIAGNOSIS — R197 Diarrhea, unspecified: Secondary | ICD-10-CM | POA: Diagnosis not present

## 2020-01-13 DIAGNOSIS — R32 Unspecified urinary incontinence: Secondary | ICD-10-CM | POA: Diagnosis present

## 2020-01-13 DIAGNOSIS — I482 Chronic atrial fibrillation, unspecified: Secondary | ICD-10-CM | POA: Diagnosis present

## 2020-01-13 DIAGNOSIS — E871 Hypo-osmolality and hyponatremia: Secondary | ICD-10-CM | POA: Diagnosis not present

## 2020-01-13 DIAGNOSIS — Z20822 Contact with and (suspected) exposure to covid-19: Secondary | ICD-10-CM | POA: Diagnosis present

## 2020-01-13 DIAGNOSIS — R069 Unspecified abnormalities of breathing: Secondary | ICD-10-CM | POA: Diagnosis not present

## 2020-01-13 DIAGNOSIS — J410 Simple chronic bronchitis: Secondary | ICD-10-CM | POA: Diagnosis present

## 2020-01-13 DIAGNOSIS — R531 Weakness: Secondary | ICD-10-CM | POA: Diagnosis not present

## 2020-01-13 DIAGNOSIS — R9431 Abnormal electrocardiogram [ECG] [EKG]: Secondary | ICD-10-CM | POA: Diagnosis not present

## 2020-01-13 DIAGNOSIS — K219 Gastro-esophageal reflux disease without esophagitis: Secondary | ICD-10-CM | POA: Diagnosis present

## 2020-01-13 LAB — MAGNESIUM: Magnesium: 0.4 mg/dL — CL (ref 1.7–2.4)

## 2020-01-13 LAB — HEPATIC FUNCTION PANEL
ALT: 15 U/L (ref 0–44)
AST: 28 U/L (ref 15–41)
Albumin: 2.9 g/dL — ABNORMAL LOW (ref 3.5–5.0)
Alkaline Phosphatase: 64 U/L (ref 38–126)
Bilirubin, Direct: <0.1 mg/dL (ref 0.0–0.2)
Total Bilirubin: 0.6 mg/dL (ref 0.3–1.2)
Total Protein: 6 g/dL — ABNORMAL LOW (ref 6.5–8.1)

## 2020-01-13 LAB — URINALYSIS, COMPLETE (UACMP) WITH MICROSCOPIC
Bacteria, UA: NONE SEEN
Bilirubin Urine: NEGATIVE
Glucose, UA: NEGATIVE mg/dL
Hgb urine dipstick: NEGATIVE
Ketones, ur: NEGATIVE mg/dL
Leukocytes,Ua: NEGATIVE
Nitrite: NEGATIVE
Protein, ur: NEGATIVE mg/dL
Specific Gravity, Urine: 1.009 (ref 1.005–1.030)
WBC, UA: NONE SEEN WBC/hpf (ref 0–5)
pH: 7 (ref 5.0–8.0)

## 2020-01-13 LAB — BASIC METABOLIC PANEL
Anion gap: 14 (ref 5–15)
BUN: 18 mg/dL (ref 8–23)
CO2: 33 mmol/L — ABNORMAL HIGH (ref 22–32)
Calcium: 6.2 mg/dL — CL (ref 8.9–10.3)
Chloride: 86 mmol/L — ABNORMAL LOW (ref 98–111)
Creatinine, Ser: 0.99 mg/dL (ref 0.61–1.24)
GFR, Estimated: >60 mL/min (ref >60)
Glucose, Bld: 175 mg/dL — ABNORMAL HIGH (ref 70–99)
Potassium: 2.3 mmol/L — CL (ref 3.5–5.1)
Sodium: 133 mmol/L — ABNORMAL LOW (ref 135–145)

## 2020-01-13 LAB — CBC
HCT: 36.5 % — ABNORMAL LOW (ref 39.0–52.0)
Hemoglobin: 11.8 g/dL — ABNORMAL LOW (ref 13.0–17.0)
MCH: 26.1 pg (ref 26.0–34.0)
MCHC: 32.3 g/dL (ref 30.0–36.0)
MCV: 80.8 fL (ref 80.0–100.0)
Platelets: 352 10*3/uL (ref 150–400)
RBC: 4.52 MIL/uL (ref 4.22–5.81)
RDW: 14.8 % (ref 11.5–15.5)
WBC: 12.9 10*3/uL — ABNORMAL HIGH (ref 4.0–10.5)
nRBC: 0 % (ref 0.0–0.2)

## 2020-01-13 LAB — RESP PANEL BY RT-PCR (FLU A&B, COVID) ARPGX2
Influenza A by PCR: NEGATIVE
Influenza B by PCR: NEGATIVE
SARS Coronavirus 2 by RT PCR: NEGATIVE

## 2020-01-13 LAB — LIPASE, BLOOD: Lipase: 21 U/L (ref 11–51)

## 2020-01-13 LAB — PHOSPHORUS: Phosphorus: 3.6 mg/dL (ref 2.5–4.6)

## 2020-01-13 MED ORDER — ALBUTEROL SULFATE HFA 108 (90 BASE) MCG/ACT IN AERS
2.0000 | INHALATION_SPRAY | RESPIRATORY_TRACT | Status: DC | PRN
  Filled 2020-01-13: qty 6.7

## 2020-01-13 MED ORDER — POTASSIUM CHLORIDE 10 MEQ/100ML IV SOLN
10.0000 meq | INTRAVENOUS | Status: AC
  Administered 2020-01-13 (×6): 10 meq via INTRAVENOUS
  Filled 2020-01-13 (×7): qty 100

## 2020-01-13 MED ORDER — MAGNESIUM SULFATE 2 GM/50ML IV SOLN
2.0000 g | Freq: Once | INTRAVENOUS | Status: AC
  Administered 2020-01-13: 2 g via INTRAVENOUS
  Filled 2020-01-13: qty 50

## 2020-01-13 MED ORDER — ENOXAPARIN SODIUM 40 MG/0.4ML ~~LOC~~ SOLN
40.0000 mg | SUBCUTANEOUS | Status: DC
  Administered 2020-01-13 – 2020-01-15 (×3): 40 mg via SUBCUTANEOUS
  Filled 2020-01-13 (×3): qty 0.4

## 2020-01-13 MED ORDER — DM-GUAIFENESIN ER 30-600 MG PO TB12: 1.0000 | ORAL_TABLET | Freq: Two times a day (BID) | ORAL | Status: DC | PRN

## 2020-01-13 MED ORDER — NICOTINE 21 MG/24HR TD PT24
21.0000 mg | MEDICATED_PATCH | Freq: Every day | TRANSDERMAL | Status: DC
  Administered 2020-01-14 – 2020-01-15 (×2): 21 mg via TRANSDERMAL
  Filled 2020-01-13 (×3): qty 1

## 2020-01-13 MED ORDER — LORAZEPAM 1 MG PO TABS: 1.0000 mg | ORAL_TABLET | ORAL | Status: DC | PRN

## 2020-01-13 MED ORDER — MAGNESIUM OXIDE 400 (241.3 MG) MG PO TABS
400.0000 mg | ORAL_TABLET | Freq: Every day | ORAL | Status: DC
  Administered 2020-01-14 – 2020-01-15 (×2): 400 mg via ORAL
  Filled 2020-01-13 (×2): qty 1

## 2020-01-13 MED ORDER — ADULT MULTIVITAMIN W/MINERALS CH
1.0000 | ORAL_TABLET | Freq: Every day | ORAL | Status: DC
  Administered 2020-01-13 – 2020-01-15 (×3): 1 via ORAL
  Filled 2020-01-13 (×3): qty 1

## 2020-01-13 MED ORDER — CALCIUM CARBONATE ANTACID 500 MG PO CHEW
1.0000 | CHEWABLE_TABLET | Freq: Three times a day (TID) | ORAL | Status: DC
  Administered 2020-01-13 – 2020-01-15 (×5): 200 mg via ORAL
  Filled 2020-01-13 (×5): qty 1

## 2020-01-13 MED ORDER — IPRATROPIUM-ALBUTEROL 20-100 MCG/ACT IN AERS
1.0000 | INHALATION_SPRAY | Freq: Four times a day (QID) | RESPIRATORY_TRACT | Status: DC
  Administered 2020-01-13 – 2020-01-15 (×6): 1 via RESPIRATORY_TRACT
  Filled 2020-01-13: qty 4

## 2020-01-13 MED ORDER — METOPROLOL TARTRATE 25 MG PO TABS
25.0000 mg | ORAL_TABLET | Freq: Two times a day (BID) | ORAL | Status: DC
  Administered 2020-01-13 – 2020-01-15 (×4): 25 mg via ORAL
  Filled 2020-01-13 (×4): qty 1

## 2020-01-13 MED ORDER — LORAZEPAM 2 MG/ML IJ SOLN: 1.0000 mg | INTRAMUSCULAR | Status: DC | PRN

## 2020-01-13 MED ORDER — SODIUM CHLORIDE 0.9 % IV SOLN: INTRAVENOUS | Status: DC

## 2020-01-13 MED ORDER — CALCIUM GLUCONATE-NACL 1-0.675 GM/50ML-% IV SOLN
1.0000 g | Freq: Once | INTRAVENOUS | Status: AC
  Administered 2020-01-13: 1000 mg via INTRAVENOUS
  Filled 2020-01-13: qty 50

## 2020-01-13 MED ORDER — LORAZEPAM 2 MG/ML IJ SOLN: 0.0000 mg | Freq: Two times a day (BID) | INTRAMUSCULAR | Status: DC

## 2020-01-13 MED ORDER — LORAZEPAM 2 MG/ML IJ SOLN: 0.0000 mg | Freq: Four times a day (QID) | INTRAMUSCULAR | Status: DC

## 2020-01-13 MED ORDER — ACETAMINOPHEN 325 MG PO TABS
650.0000 mg | ORAL_TABLET | Freq: Four times a day (QID) | ORAL | Status: DC | PRN
  Administered 2020-01-14: 650 mg via ORAL
  Filled 2020-01-13: qty 2

## 2020-01-13 MED ORDER — ASPIRIN 325 MG PO TABS
325.0000 mg | ORAL_TABLET | Freq: Every day | ORAL | Status: DC
  Administered 2020-01-14 – 2020-01-15 (×2): 325 mg via ORAL
  Filled 2020-01-13 (×2): qty 1

## 2020-01-13 MED ORDER — FOLIC ACID 1 MG PO TABS
1.0000 mg | ORAL_TABLET | Freq: Every day | ORAL | Status: DC
  Administered 2020-01-13 – 2020-01-15 (×3): 1 mg via ORAL
  Filled 2020-01-13 (×3): qty 1

## 2020-01-13 MED ORDER — HYDRALAZINE HCL 20 MG/ML IJ SOLN: 5.0000 mg | INTRAMUSCULAR | Status: DC | PRN

## 2020-01-13 MED ORDER — THIAMINE HCL 100 MG PO TABS
100.0000 mg | ORAL_TABLET | Freq: Every day | ORAL | Status: DC
  Administered 2020-01-13 – 2020-01-15 (×3): 100 mg via ORAL
  Filled 2020-01-13 (×3): qty 1

## 2020-01-13 MED ORDER — POTASSIUM CHLORIDE CRYS ER 20 MEQ PO TBCR
40.0000 meq | EXTENDED_RELEASE_TABLET | Freq: Once | ORAL | Status: AC
  Administered 2020-01-13: 40 meq via ORAL
  Filled 2020-01-13: qty 2

## 2020-01-13 MED ORDER — CALCIUM CITRATE-VITAMIN D 500-500 MG-UNIT PO CHEW
1.0000 | CHEWABLE_TABLET | Freq: Every day | ORAL | Status: DC
  Administered 2020-01-14 – 2020-01-15 (×2): 1 via ORAL
  Filled 2020-01-13 (×3): qty 1

## 2020-01-13 MED ORDER — THIAMINE HCL 100 MG/ML IJ SOLN: 100.0000 mg | Freq: Every day | INTRAMUSCULAR | Status: DC

## 2020-01-13 NOTE — ED Notes (Signed)
Messaged pharmacy regarding ED Pyxis being out of KCL IVPB 10 mEq in 130mL at this time

## 2020-01-13 NOTE — H&P (Addendum)
History and Physical    Daniel Edwards QMV:[id number] DOB: 07-27-1934 DOA: 01/13/2020  Referring MD/NP/PA:   PCP: Madelyn Brunner, MD   Patient coming from:  The patient is coming from home.  At baseline, pt is independent for most of ADL.        Chief Complaint: Increased urinary frequency, shortness of breath, dizziness, generalized weakness, mild cough, mild shortness of breath  HPI: Daniel Edwards is a 85 y.o. male with medical history significant of hypertension, COPD, GERD, BPH, UTI, atrial fibrillation not on anticoagulants, alcohol abuse in remission, tobacco abuse, who presents with Increased urinary frequency, shortness of breath, dizziness, generalized weakness, mild cough, mild shortness of breath.  Patient states that he has increased urinary frequency for more than 3 days.  He goes to the bathroom urinating frequently, denies dysuria or burning on urination.  He also states that he has mild cough, mild shortness of breath, no chest pain, fever or chills.  He feels dizzy and has a generalized weakness.  No unilateral numbness or tingling self empties.  No facial droop or slurred speech.  Patient did not have fall or any injury recently.   ED Course: pt was found to have WBC 12.9, negative urinalysis, negative Covid PCR, potassium 2.3, magnesium 0.4, calcium 6.2, sodium 133.  Temperature normal, blood pressure 147/89, heart rate 76, RR 20, oxygen saturation 95% on room air.  Patient is placed on MedSurg bed for observation   Review of Systems:   General: no fevers, chills, no body weight gain, has fatigue and weakness HEENT: no blurry vision, hearing changes or sore throat Respiratory: has dyspnea, coughing, no wheezing CV: no chest pain, no palpitations GI: no nausea, vomiting, abdominal pain, diarrhea, constipation GU: no dysuria, burning on urination, has increased urinary frequency, no hematuria  Ext: no leg edema Neuro: no unilateral weakness, numbness, or tingling,  no vision change or hearing loss Skin: no rash, no skin tear. MSK: No muscle spasm, no deformity, no limitation of range of movement in spin Heme: No easy bruising.  Travel history: No recent long distant travel.  Allergy: No Known Allergies  Past Medical History:  Diagnosis Date  . BPH (benign prostatic hyperplasia)   . COPD (chronic obstructive pulmonary disease) ([place])   . Erectile dysfunction   . Essential hypertension   . GERD (gastroesophageal reflux disease)   . Simple chronic bronchitis ([place])     Past Surgical History:  Procedure Laterality Date  . ANTERIOR VITRECTOMY Left 11/07/2019   Procedure: ANTERIOR VITRECTOMY;  Surgeon: Marchia Meiers, MD;  Location: [facility];  Service: Ophthalmology;  Laterality: Left;  . CATARACT EXTRACTION W/PHACO Right 09/05/2019   Procedure: CATARACT EXTRACTION PHACO AND INTRAOCULAR LENS PLACEMENT ([place]) RIGHT MALYUGIN  20.18  01:29.1;  Surgeon: Marchia Meiers, MD;  Location: [facility];  Service: Ophthalmology;  Laterality: Right;  . CATARACT EXTRACTION W/PHACO Left 11/07/2019   Procedure: CATARACT EXTRACTION PHACO AND INTRAOCULAR LENS PLACEMENT (I[facility]) LEFT VISION BLUE 32.39 02:29.5 ;  Surgeon: Marchia Meiers, MD;  Location: [facility];  Service: Ophthalmology;  Laterality: Left;  . INGUINAL HERNIA REPAIR      Social History:  reports that he has been smoking cigarettes. He has been smoking about 0.50 packs per day. He has never used smokeless tobacco. He reports previous alcohol use. He reports that he does not use drugs.  Family History:  Family History  Problem Relation Age of Onset  . Cancer Mother  Prior to Admission medications   Medication Sig Start Date End Date Taking? Authorizing Provider  aspirin 325 MG tablet Take 325 mg by mouth daily.    [provider]  calcium carbonate (TUMS - DOSED IN MG ELEMENTAL CALCIUM) 500 MG chewable tablet Chew 1 tablet (200 mg of elemental calcium total) by mouth 3  (three) times daily. 03/01/19   Loletha Grayer, MD  diphenhydramine-acetaminophen (TYLENOL PM) 25-500 MG TABS tablet Take 1 tablet by mouth at bedtime as needed (pain or sleep).    [provider]  Ipratropium-Albuterol (COMBIVENT) 20-100 MCG/ACT AERS respimat Inhale 1 puff into the lungs every 6 (six) hours. 03/01/19   Loletha Grayer, MD  magnesium oxide (MAG-OX) 400 (241.3 Mg) MG tablet Take 1 tablet (400 mg total) by mouth daily. 03/01/19   Loletha Grayer, MD  metoprolol tartrate (LOPRESSOR) 25 MG tablet Take 1 tablet (25 mg total) by mouth 2 (two) times daily. 03/01/19   Loletha Grayer, MD  Multiple Vitamin (MULTIVITAMIN WITH MINERALS) TABS tablet Take 1 tablet by mouth daily. 01/28/19   Sidney Ace, MD  omeprazole (PRILOSEC) 20 MG capsule Take 20 mg by mouth daily.     [provider]  tamsulosin (FLOMAX) 0.4 MG CAPS capsule Take 1 capsule (0.4 mg total) by mouth daily. 11/21/18   Vaughan Basta, MD  thiamine 100 MG tablet Take 1 tablet (100 mg total) by mouth daily. 01/28/19   Sidney Ace, MD    Physical Exam: Vitals:   01/13/20 1430 01/13/20 1445 01/13/20 1615 01/13/20 1700  BP: (!) 137/100 (!) 137/100 (!) 121/105 (!) 129/114  Pulse: (!) 102 80 90 90  Resp:  18 18 18   Temp:      TempSrc:      SpO2: 91% 96% 99% 99%  Weight:      Height:       General: Not in acute distress HEENT:       Eyes: PERRL, EOMI, no scleral icterus.       ENT: No discharge from the ears and nose, no pharynx injection, no tonsillar enlargement.        Neck: No JVD, no bruit, no mass felt. Heme: No neck lymph node enlargement. Cardiac: S1/S2, RRR, No murmurs, No gallops or rubs. Respiratory: No rales, wheezing, rhonchi or rubs. GI: Soft, nondistended, nontender, no rebound pain, no organomegaly, BS present. GU: No hematuria Ext: No pitting leg edema bilaterally. 2+DP/PT pulse bilaterally. Musculoskeletal: No joint deformities, No joint redness or warmth, no  limitation of ROM in spin. Skin: No rashes.  Neuro: Alert, oriented X3, cranial nerves II-XII grossly intact, moves all extremities normally.  Psych: Patient is not psychotic, no suicidal or hemocidal ideation.  Labs on Admission: I have personally reviewed following labs and imaging studies  CBC: Recent Labs  Lab 01/13/20 0329  WBC 12.9*  HGB 11.8*  HCT 36.5*  MCV 80.8  PLT 144   Basic Metabolic Panel: Recent Labs  Lab 01/13/20 0329  NA 133*  K 2.3*  CL 86*  CO2 33*  GLUCOSE 175*  BUN 18  CREATININE 0.99  CALCIUM 6.2*  MG 0.4*  PHOS 3.6   GFR: Estimated Creatinine Clearance: 52.6 mL/min (by C-G formula based on SCr of 0.99 mg/dL). Liver Function Tests: Recent Labs  Lab 01/13/20 0329  AST 28  ALT 15  ALKPHOS 64  BILITOT 0.6  PROT 6.0*  ALBUMIN 2.9*   Recent Labs  Lab 01/13/20 0329  LIPASE 21   No results for input(s):  AMMONIA in the last 168 hours. Coagulation Profile: No results for input(s): INR, PROTIME in the last 168 hours. Cardiac Enzymes: No results for input(s): CKTOTAL, CKMB, CKMBINDEX, TROPONINI in the last 168 hours. BNP (last 3 results) No results for input(s): PROBNP in the last 8760 hours. HbA1C: No results for input(s): HGBA1C in the last 72 hours. CBG: No results for input(s): GLUCAP in the last 168 hours. Lipid Profile: No results for input(s): CHOL, HDL, LDLCALC, TRIG, CHOLHDL, LDLDIRECT in the last 72 hours. Thyroid Function Tests: No results for input(s): TSH, T4TOTAL, FREET4, T3FREE, THYROIDAB in the last 72 hours. Anemia Panel: No results for input(s): VITAMINB12, FOLATE, FERRITIN, TIBC, IRON, RETICCTPCT in the last 72 hours. Urine analysis:    Component Value Date/Time   COLORURINE YELLOW (A) 01/13/2020 0329   APPEARANCEUR CLEAR (A) 01/13/2020 0329   APPEARANCEUR Cloudy 01/26/2013 1319   LABSPEC 1.009 01/13/2020 0329   LABSPEC 1.009 01/26/2013 1319   PHURINE 7.0 01/13/2020 0329   GLUCOSEU NEGATIVE 01/13/2020 0329    GLUCOSEU Negative 01/26/2013 1319   HGBUR NEGATIVE 01/13/2020 0329   BILIRUBINUR NEGATIVE 01/13/2020 0329   BILIRUBINUR Negative 01/26/2013 1319   KETONESUR NEGATIVE 01/13/2020 0329   PROTEINUR NEGATIVE 01/13/2020 0329   NITRITE NEGATIVE 01/13/2020 0329   LEUKOCYTESUR NEGATIVE 01/13/2020 0329   LEUKOCYTESUR 3+ 01/26/2013 1319   Sepsis Labs: @LABRCNTIP (procalcitonin:4,lacticidven:4) ) Recent Results (from the past 240 hour(s))  Resp Panel by RT-PCR (Flu A&B, Covid) Nasopharyngeal Swab     Status: None   Collection Time: 01/13/20  6:17 AM   Specimen: Nasopharyngeal Swab; Nasopharyngeal(NP) swabs in vial transport medium  Result Value Ref Range Status   SARS Coronavirus 2 by RT PCR NEGATIVE NEGATIVE Final    Comment: (NOTE) SARS-CoV-2 target nucleic acids are NOT DETECTED.  The SARS-CoV-2 RNA is generally detectable in upper respiratory specimens during the acute phase of infection. The lowest concentration of SARS-CoV-2 viral copies this assay can detect is 138 copies/mL. A negative result does not preclude SARS-Cov-2 infection and should not be used as the sole basis for treatment or other patient management decisions. A negative result may occur with  improper specimen collection/handling, submission of specimen other than nasopharyngeal swab, presence of viral mutation(s) within the areas targeted by this assay, and inadequate number of viral copies(<138 copies/mL). A negative result must be combined with clinical observations, patient history, and epidemiological information. The expected result is Negative.  Fact Sheet for Patients:  EntrepreneurPulse.com.au  Fact Sheet for Healthcare Providers:  IncredibleEmployment.be  This test is no t yet approved or cleared by the [place] FDA and  has been authorized for detection and/or diagnosis of SARS-CoV-2 by FDA under an Emergency Use Authorization (EUA). This EUA will remain  in  effect (meaning this test can be used) for the duration of the COVID-19 declaration under Section 564(b)(1) of the Act, 21 U.S.C.section 360bbb-3(b)(1), unless the authorization is terminated  or revoked sooner.       Influenza A by PCR NEGATIVE NEGATIVE Final   Influenza B by PCR NEGATIVE NEGATIVE Final    Comment: (NOTE) The Xpert Xpress SARS-CoV-2/FLU/RSV plus assay is intended as an aid in the diagnosis of influenza from Nasopharyngeal swab specimens and should not be used as a sole basis for treatment. Nasal washings and aspirates are unacceptable for Xpert Xpress SARS-CoV-2/FLU/RSV testing.  Fact Sheet for Patients: EntrepreneurPulse.com.au  Fact Sheet for Healthcare Providers: IncredibleEmployment.be  This test is not yet approved or cleared by the [place] FDA and has  been authorized for detection and/or diagnosis of SARS-CoV-2 by FDA under an Emergency Use Authorization (EUA). This EUA will remain in effect (meaning this test can be used) for the duration of the COVID-19 declaration under Section 564(b)(1) of the Act, 21 U.S.C. section 360bbb-3(b)(1), unless the authorization is terminated or revoked.  Performed at [facility], [address], [place], [place] [address]      Radiological Exams on Admission: No results found.   EKG: I have personally reviewed.  Not done in ED, will get one.   Assessment/Plan Principal Problem:   Disorder of electrolytes Active Problems:   Atrial fibrillation, chronic (HCC)   Hypokalemia   Alcoholism (HCC)   Hypomagnesemia   Hypocalcemia   Benign prostatic hyperplasia   Hyponatremia   Tobacco abuse   COPD (chronic obstructive pulmonary disease) (HCC)   Leukocytosis   Urinary frequency   Disorder of electrolytes including hypokalemia, hypomagnesemia, hypocalcemia and hyponatremia: Potassium 2.3, calcium 6.2, magnesium 0.4, sodium 133.  Phosphorus 4.9.  Etiology is not  clear.  -Will admit to MedSurg bed as inpt -replete potassium: 10 mEq of IV potassium chloride x6, oral potassium chloride 40 mEq x 2 -Replete calcium: 1 g of calcium gluconate IV -Replete magnesium: IV Magnesium sulfate 2 g x 2 -Vitamin D and calcium supplement -Check PTH level and vitamin D 1, 25 level -Repeat electrolytes in the morning -IV fluid: Normal saline 75 cc/h  Atrial fibrillation, chronic ([place]): Not on anticoagulants at home -Continue metoprolol  Alcoholism ([place]) -In remission for more than 1 year  Benign prostatic hyperplasia -Not taking medications currently  Tobacco abuse -Nicotine patch  COPD (chronic obstructive pulmonary disease) ([place]): Patient reports mild shortness breath, and mild dry cough, but no oxygen desaturation.  No wheezing or rhonchi on auscultation -Bronchodilators  Leukocytosis: WBC 12.9, no fever, no source of infection identified. -Follow-up with CBC  Urinary frequency: Urinalysis negative. -Follow-up urine culture   DVT ppx: SQ Lovenox Code Status: Full code Family Communication:    Yes, patient's son by phone  Disposition Plan:  Anticipate discharge back to previous environment Consults called:  none Admission status: Med-surg bed for obs    Status is: Inpatient  Remains inpatient appropriate because:Inpatient level of care appropriate due to severity of illness.  Patient has multiple comorbidities, now presents with severe multiple electrolyte disturbance.  His presentation is highly complicated.  Patient is at high risk of deteriorating.  Need to be treated in hospital for at least 2 days.   Dispo: The patient is from: Home              Anticipated d/c is to: Home              Anticipated d/c date is: 2 days              Patient currently is not medically stable to d/c.          Date of Service 01/13/2020    Ivor Costa [facility]   If 7PM-7AM, please contact night-coverage [email or web address] 01/13/2020, 5:28 PM

## 2020-01-13 NOTE — ED Notes (Signed)
Meal tray given at this time 

## 2020-01-13 NOTE — ED Notes (Addendum)
Date and time results received: 01/13/20 0605  Test: Magnesium  Critical Value: 0.4  Name of Provider Notified: Dr. Karma Greaser   Orders Received? Or Actions Taken?: Acknowledged

## 2020-01-13 NOTE — ED Notes (Signed)
Date and time results received: 01/13/20  (use smartphrase ".now" to insert current time)  Test: Calcium/ Potassium Critical Value: 6.2/2.3  Name of Provider Notified: Dr. Karma Greaser  Orders Received? Or Actions Taken?: acknowledged

## 2020-01-13 NOTE — ED Notes (Signed)
Transport request placed by Lorenda Ishihara, ED Network engineer at this time

## 2020-01-13 NOTE — ED Notes (Signed)
Pt given meal tray at this time 

## 2020-01-13 NOTE — ED Triage Notes (Signed)
Pt to ED via EMS from home c/o urinary frequency for a couple days, incontinence, denies burn or odor or blood in urine.  States hx of UTIs and feels similar. Denies pain or SOB.  Pt A&Ox4, chest rise even and unlabored, in NAD at this time.

## 2020-01-13 NOTE — Progress Notes (Signed)
PT Cancellation Note  Patient Details Name: Daniel Edwards MRN: [id number] DOB: [birth year]   Cancelled Treatment:    Reason Eval/Treat Not Completed: Medical issues which prohibited therapy (Patient consult received and reviewed. Patient's calcium and pottassium outside of therapeutic range. Will continue to monitor and attempt again when ready.)  Janna Arch, PT, DPT   01/13/2020, 10:51 AM

## 2020-01-13 NOTE — ED Notes (Signed)
Pillow placed behind pt back for support/pt comfort at this time

## 2020-01-13 NOTE — Progress Notes (Signed)
OT Cancellation Note  Patient Details Name: Daniel Edwards MRN: [id number] DOB: 1934/12/06   Cancelled Treatment:    Reason Eval/Treat Not Completed: Medical issues which prohibited therapy  OT consult received and chart reviewed. Upon chart review, noted that K+ is at 2.3 and Ca at 6.2 which are outside of therapeutic range for activity participation. Will follow up for OT evaluation at later date/time as appropriate.   Gerrianne Scale, [place], OTR/L ascom [phone] 01/13/20, 1:17 PM

## 2020-01-13 NOTE — ED Triage Notes (Signed)
First Nurse: patient brought in be ems from home. Patient with complaint of shortness of breath. Patient has a history of COPD. EMS vs 110/77, afib 80-100 hr, 96% room air.

## 2020-01-13 NOTE — Progress Notes (Signed)
On interviewing patient upon admission to room 230, BP noted to be elevated, patient stated that he had just taken his BP medication "15 minutes ago". eMAR reviewed for any medication given, none found; Found Metoprolol pill bottle and Combivent inhaler in patient belongs bag; taken and counted and sent to pharmacy.  Patient refused to have wallet sent to Security to be locked up, $203 dollars: 9-$20's, 3-$5's, and 8-$1's; "I'll send it home with my son tomorrow". Also, when assisting patient to get his arm into his gown, he became agitated and jerked his arm towards this RN and became verbally aggressive; encouraged patient to calm down, that I was just trying to get his gown on properly; patient calmed down. Barbaraann Faster, RN 8:31 PM 01/13/2020

## 2020-01-13 NOTE — ED Notes (Signed)
2C called to confirm room is clean, per 2C, room is clean. Transport called at this time

## 2020-01-13 NOTE — ED Notes (Signed)
Pt up to toilet at this time. ?

## 2020-01-13 NOTE — Progress Notes (Signed)
Patient educated on use of phone to call for assistance to get OOB, or if he drops anything; not to get up without assistance; bed alarm engaged. Phone, remote, urinal and water within reach. Barbaraann Faster, RN 9:41 PM 01/13/2020

## 2020-01-13 NOTE — ED Provider Notes (Signed)
[facility] Emergency Department Provider Note  ____________________________________________   First MD Initiated Contact with Patient 01/13/20 [phone]     (approximate)  I have reviewed the triage vital signs and the nursing notes.   HISTORY  Chief Complaint Urinary Frequency    HPI Daniel Edwards is a 85 y.o. male with extensive medical history as listed below who presents for evaluation of urinary incontinence and urinary frequency.  He said he woke up during the night having lost control of his bladder and feels like he cannot stop urinating.  He has no control over it.  He has no burning when he pees no blood is visible in the urine.  He describes his symptoms as acute in onset and severe and makes it better or worse.  The patient denies fever, sore throat, chest pain, shortness of breath, nausea, vomiting, abdominal pain.  He feels little bit tired and rundown recently but has no other specific complaints or concerns.         Past Medical History:  Diagnosis Date  . BPH (benign prostatic hyperplasia)   . COPD (chronic obstructive pulmonary disease) ([place])   . Erectile dysfunction   . Essential hypertension   . GERD (gastroesophageal reflux disease)   . Simple chronic bronchitis [facility])     Patient Active Problem List   Diagnosis Date Noted  . Hypocalcemia   . AF (paroxysmal atrial fibrillation) ([place])   . Benign prostatic hyperplasia   . Acute respiratory failure with hypoxia ([place])   . Lobar pneumonia ([place])   . Acute cystitis without hematuria   . Hypomagnesemia   . Hypoxia 02/26/2019  . CAP (community acquired pneumonia) 02/26/2019  . Protein-calorie malnutrition, severe 01/27/2019  . Hypokalemia 01/24/2019  . Alcoholism ([place]) 01/24/2019  . ARF (acute renal failure) ([place]) 01/24/2019  . Weakness 01/24/2019  . FTT (failure to thrive) in adult 01/24/2019  . Rhabdomyolysis 11/15/2018  . Atrial fibrillation, chronic ([place]) 07/20/2015  .  Chest pain 02/19/2015  . Atypical chest pain 02/19/2015    Past Surgical History:  Procedure Laterality Date  . ANTERIOR VITRECTOMY Left 11/07/2019   Procedure: ANTERIOR VITRECTOMY;  Surgeon: Marchia Meiers, MD;  Location: [place];  Service: Ophthalmology;  Laterality: Left;  . CATARACT EXTRACTION W/PHACO Right 09/05/2019   Procedure: CATARACT EXTRACTION PHACO AND INTRAOCULAR LENS PLACEMENT ([place]) RIGHT MALYUGIN  20.18  01:29.1;  Surgeon: Marchia Meiers, MD;  Location: [place];  Service: Ophthalmology;  Laterality: Right;  . CATARACT EXTRACTION W/PHACO Left 11/07/2019   Procedure: CATARACT EXTRACTION PHACO AND INTRAOCULAR LENS PLACEMENT (I[facility]) LEFT VISION BLUE 32.39 02:29.5 ;  Surgeon: Marchia Meiers, MD;  Location: [place];  Service: Ophthalmology;  Laterality: Left;  . INGUINAL HERNIA REPAIR      Prior to Admission medications   Medication Sig Start Date End Date Taking? Authorizing Provider  aspirin 325 MG tablet Take 325 mg by mouth daily.    [provider]  calcium carbonate (TUMS - DOSED IN MG ELEMENTAL CALCIUM) 500 MG chewable tablet Chew 1 tablet (200 mg of elemental calcium total) by mouth 3 (three) times daily. 03/01/19   Loletha Grayer, MD  diphenhydramine-acetaminophen (TYLENOL PM) 25-500 MG TABS tablet Take 1 tablet by mouth at bedtime as needed (pain or sleep).    [provider]  Ipratropium-Albuterol (COMBIVENT) 20-100 MCG/ACT AERS respimat Inhale 1 puff into the lungs every 6 (six) hours. 03/01/19   Loletha Grayer, MD  magnesium oxide (MAG-OX) 400 (241.3 Mg) MG  tablet Take 1 tablet (400 mg total) by mouth daily. 03/01/19   Loletha Grayer, MD  metoprolol tartrate (LOPRESSOR) 25 MG tablet Take 1 tablet (25 mg total) by mouth 2 (two) times daily. 03/01/19   Loletha Grayer, MD  Multiple Vitamin (MULTIVITAMIN WITH MINERALS) TABS tablet Take 1 tablet by mouth daily. 01/28/19   Sidney Ace, MD  omeprazole (PRILOSEC) 20 MG  capsule Take 20 mg by mouth daily.     [provider]  tamsulosin (FLOMAX) 0.4 MG CAPS capsule Take 1 capsule (0.4 mg total) by mouth daily. 11/21/18   Vaughan Basta, MD  thiamine 100 MG tablet Take 1 tablet (100 mg total) by mouth daily. 01/28/19   Sidney Ace, MD    Allergies Patient has no known allergies.  Family History  Problem Relation Age of Onset  . Cancer Mother     Social History Social History   Tobacco Use  . Smoking status: Current Every Day Smoker    Packs/day: 0.50    Types: Cigarettes  . Smokeless tobacco: Never Used  Vaping Use  . Vaping Use: Never used  Substance Use Topics  . Alcohol use: Not Currently    Alcohol/week: 0.0 standard drinks  . Drug use: Not on file    Review of Systems Constitutional: No fever/chills.  Some general fatigue and malaise. Eyes: No visual changes. ENT: No sore throat. Cardiovascular: Denies chest pain. Respiratory: Denies shortness of breath. Gastrointestinal: No abdominal pain.  No nausea, no vomiting.  No diarrhea.  No constipation. Genitourinary: Negative for dysuria.  Increased urinary frequency and urinary incontinence. Musculoskeletal: Negative for neck pain.  Negative for back pain. Integumentary: Negative for rash. Neurological: Negative for headaches, focal weakness or numbness.   ____________________________________________   PHYSICAL EXAM:  VITAL SIGNS: ED Triage Vitals  Enc Vitals Group     BP 01/13/20 0322 110/76     Pulse Rate 01/13/20 0322 61     Resp 01/13/20 0322 16     Temp 01/13/20 0322 97.9 F (36.6 C)     Temp Source 01/13/20 0322 Oral     SpO2 01/13/20 0322 96 %     Weight 01/13/20 0325 65.8 kg (145 lb)     Height 01/13/20 0325 1.778 m (5\' 10" )     Head Circumference --      Peak Flow --      Pain Score 01/13/20 0325 0     Pain Loc --      Pain Edu? --      Excl. in [place]? --     Constitutional: Alert and oriented.  Eyes: Conjunctivae are normal.  Head:  Atraumatic. Nose: No congestion/rhinnorhea. Mouth/Throat: Patient is wearing a mask. Neck: No stridor.  No meningeal signs.   Cardiovascular: Normal rate, regular rhythm. Good peripheral circulation. Grossly normal heart sounds. Respiratory: Normal respiratory effort.  No retractions. Gastrointestinal: Soft and nontender. No distention.  Musculoskeletal: No lower extremity tenderness nor edema. No gross deformities of extremities. Neurologic:  Normal speech and language. No gross focal neurologic deficits are appreciated.  Skin:  Skin is warm, dry and intact. Psychiatric: Mood and affect are normal. Speech and behavior are normal.  ____________________________________________   LABS (all labs ordered are listed, but only abnormal results are displayed)  Labs Reviewed  BASIC METABOLIC PANEL - Abnormal; Notable for the following components:      Result Value   Sodium 133 (*)    Potassium 2.3 (*)    Chloride 86 (*)  CO2 33 (*)    Glucose, Bld 175 (*)    Calcium 6.2 (*)    All other components within normal limits  CBC - Abnormal; Notable for the following components:   WBC 12.9 (*)    Hemoglobin 11.8 (*)    HCT 36.5 (*)    All other components within normal limits  URINALYSIS, COMPLETE (UACMP) WITH MICROSCOPIC  MAGNESIUM  PHOSPHORUS  HEPATIC FUNCTION PANEL  LIPASE, BLOOD  CALCIUM, IONIZED  PARATHYROID HORMONE, INTACT (NO CA)   ____________________________________________  EKG  ED ECG REPORT I, Hinda Kehr, the attending physician, personally viewed and interpreted this ECG.  Date: 01/13/2020 EKG Time: 4:33 AM Rate: 72 Rhythm: Atrial fibrillation with occasional ventricular premature complex QRS Axis: normal Intervals: Right bundle branch block, abnormal due to the atrial fibrillation, also concerning with QTC of 602 ms. ST/T Wave abnormalities: Non-specific ST segment / T-wave changes, but no clear evidence of acute ischemia.  He has a small amount of global ST  segment depression. Narrative Interpretation: no definitive evidence of acute ischemia; does not meet STEMI criteria.  Substantial change from last EKG on record, likely the result of acute electrolyte abnormalities.   ____________________________________________  RADIOLOGY Ursula Alert, personally viewed and evaluated these images (plain radiographs) as part of my medical decision making, as well as reviewing the written report by the radiologist.  ED MD interpretation: No indication for emergent imaging  Official radiology report(s): No results found.  ____________________________________________   PROCEDURES   Procedure(s) performed (including Critical Care):  .1-3 Lead EKG Interpretation Performed by: Hinda Kehr, MD Authorized by: Hinda Kehr, MD     Interpretation: abnormal     ECG rate:  75   ECG rate assessment: normal     Rhythm: atrial fibrillation     Ectopy: PVCs    .Critical Care Performed by: Hinda Kehr, MD Authorized by: Hinda Kehr, MD   Critical care provider statement:    Critical care time (minutes):  30   Critical care time was exclusive of:  Separately billable procedures and treating other patients   Critical care was necessary to treat or prevent imminent or life-threatening deterioration of the following conditions:  Metabolic crisis   Critical care was time spent personally by me on the following activities:  Development of treatment plan with patient or surrogate, discussions with consultants, evaluation of patient's response to treatment, examination of patient, obtaining history from patient or surrogate, ordering and performing treatments and interventions, ordering and review of laboratory studies, ordering and review of radiographic studies, pulse oximetry, re-evaluation of patient's condition and review of old charts     ____________________________________________   [place] / MDM / [place] / ED  COURSE  As part of my medical decision making, I reviewed the following data within the [place] notes reviewed and incorporated, Labs reviewed , EKG interpreted , Old EKG reviewed, Discussed with admitting physician (Dr. Sidney Ace) and Notes from prior ED visits   Differential diagnosis includes, but is not limited to, nonspecific electrolyte abnormalities, UTI, cardiac dysrhythmia.  The patient is on the cardiac monitor to evaluate for evidence of arrhythmia and/or significant heart rate changes.  Patient's EKG is concerning because of his prolonged QTc interval, global minimal ST segment depression, and premature ventricular contractions, all of which are consistent with his profound electrolyte deficiencies, specifically his hypokalemia of 2.3 but also his hypocalcemia 6.2.  I ordered magnesium, phosphorus, PTH, and ionized calcium levels, as well as hepatic  function panel, but these values are more for reference and are unlikely to change management.  Urinalysis is pending to see if the patient has a UTI which is what brought him to the emergency department in the first place, but he will need admission given his severe hypokalemia with EKG changes and severe hypocalcemia.  To be in treatment I have ordered potassium 40 mEq by mouth, potassium 10 mEq by IV x6 doses, magnesium 2 g IV, and calcium gluconate 1 g IV infusion over 60 minutes.  I updated the patient about the results and he understands and agrees with the plan.     Clinical Course as of Jan 12 629  Mon Jan 13, 2020  0607 Already began to replete with the mag 2 g IV.  Magnesium(!!): 0.4 [CF]  0607 Phosphorus: 3.6 [CF]  0618 Discussed case by phone with Dr. Sidney Ace who will admit.   [CF]  9326 No indication of infection on urinalysis.  I am adding on a urine culture given that this is the issue for which the patient came to the emergency department, but at this point I cannot recommend empiric treatment.   Urinalysis, Complete w Microscopic Urine, Clean Catch(!) [CF]    Clinical Course User Index [CF] Hinda Kehr, MD     ____________________________________________  FINAL CLINICAL IMPRESSION(S) / ED DIAGNOSES  Final diagnoses:  Urinary frequency  Hypokalemia  Hypocalcemia  Prolonged QT interval  Abnormal EKG     MEDICATIONS GIVEN DURING THIS VISIT:  Medications  potassium chloride SA (KLOR-CON) CR tablet 40 mEq (has no administration in time range)  magnesium sulfate IVPB 2 g 50 mL (has no administration in time range)  potassium chloride 10 mEq in 100 mL IVPB (has no administration in time range)  calcium gluconate 1 g/ 50 mL sodium chloride IVPB (has no administration in time range)     ED Discharge Orders    None      *Please note:  ANDRUE DINI was evaluated in Emergency Department on 01/13/2020 for the symptoms described in the history of present illness. He was evaluated in the context of the global COVID-19 pandemic, which necessitated consideration that the patient might be at risk for infection with the SARS-CoV-2 virus that causes COVID-19. Institutional protocols and algorithms that pertain to the evaluation of patients at risk for COVID-19 are in a state of rapid change based on information released by regulatory bodies including the CDC and federal and state organizations. These policies and algorithms were followed during the patient's care in the ED.  Some ED evaluations and interventions may be delayed as a result of limited staffing during and after the pandemic.*  Note:  This document was prepared using Dragon voice recognition software and may include unintentional dictation errors.   Hinda Kehr, MD 01/13/20 [phone]

## 2020-01-14 LAB — CBC
HCT: 32.7 % — ABNORMAL LOW (ref 39.0–52.0)
Hemoglobin: 10.9 g/dL — ABNORMAL LOW (ref 13.0–17.0)
MCH: 26.5 pg (ref 26.0–34.0)
MCHC: 33.3 g/dL (ref 30.0–36.0)
MCV: 79.6 fL — ABNORMAL LOW (ref 80.0–100.0)
Platelets: 260 10*3/uL (ref 150–400)
RBC: 4.11 MIL/uL — ABNORMAL LOW (ref 4.22–5.81)
RDW: 15.3 % (ref 11.5–15.5)
WBC: 12.7 10*3/uL — ABNORMAL HIGH (ref 4.0–10.5)
nRBC: 0 % (ref 0.0–0.2)

## 2020-01-14 LAB — URINE CULTURE
Culture: NO GROWTH
Special Requests: NORMAL

## 2020-01-14 LAB — BASIC METABOLIC PANEL
Anion gap: 10 (ref 5–15)
BUN: 20 mg/dL (ref 8–23)
CO2: 32 mmol/L (ref 22–32)
Calcium: 6.7 mg/dL — ABNORMAL LOW (ref 8.9–10.3)
Chloride: 91 mmol/L — ABNORMAL LOW (ref 98–111)
Creatinine, Ser: 0.79 mg/dL (ref 0.61–1.24)
GFR, Estimated: >60 mL/min (ref >60)
Glucose, Bld: 95 mg/dL (ref 70–99)
Potassium: 3.4 mmol/L — ABNORMAL LOW (ref 3.5–5.1)
Sodium: 133 mmol/L — ABNORMAL LOW (ref 135–145)

## 2020-01-14 LAB — CALCIUM, IONIZED: Calcium, Ionized, Serum: 3.8 mg/dL — ABNORMAL LOW (ref 4.5–5.6)

## 2020-01-14 LAB — MAGNESIUM: Magnesium: 1.2 mg/dL — ABNORMAL LOW (ref 1.7–2.4)

## 2020-01-14 MED ORDER — MAGNESIUM SULFATE 4 GM/100ML IV SOLN
4.0000 g | Freq: Once | INTRAVENOUS | Status: AC
  Administered 2020-01-14: 4 g via INTRAVENOUS
  Filled 2020-01-14: qty 100

## 2020-01-14 MED ORDER — POTASSIUM CHLORIDE 10 MEQ/100ML IV SOLN
10.0000 meq | INTRAVENOUS | Status: AC
  Administered 2020-01-14 (×2): 10 meq via INTRAVENOUS
  Filled 2020-01-14 (×2): qty 100

## 2020-01-14 MED ORDER — ALUM & MAG HYDROXIDE-SIMETH 200-200-20 MG/5ML PO SUSP
30.0000 mL | ORAL | Status: DC | PRN
  Administered 2020-01-14: 30 mL via ORAL
  Filled 2020-01-14: qty 30

## 2020-01-14 NOTE — Progress Notes (Signed)
B.Morrison, NP acknowledged. Barbaraann Faster, RN

## 2020-01-14 NOTE — Progress Notes (Signed)
  Chaplain On-Call responded to Order Requisition "patient would like to explore a Living Will". Chaplain met patient and offered the Advance Directives information. Patient stated that he did not want this information, and he declined to receive it.  [place] Elisandra Deshmukh M.Div., [facility]

## 2020-01-14 NOTE — Progress Notes (Addendum)
PROGRESS NOTE    Daniel Edwards  FGH:[id number] DOB: [birth year] DOA: 01/13/2020 PCP: Madelyn Brunner, MD   Chief complaint.  Generalized weakness. Brief Narrative:  Daniel Edwards is a 85 y.o. male with medical history significant of hypertension, COPD, GERD, BPH, UTI, atrial fibrillation not on anticoagulants, alcohol abuse in remission, tobacco abuse, who presents with Increased urinary frequency, shortness of breath, dizziness, generalized weakness, mild cough, mild shortness of breath. Upon arriving the hospital, lab tests showed a severe hypokalemia of 2.3, hypomagnesemia 0.4, hypocalcemia 6.2.  Patient denies recent diarrhea or nausea vomiting.  He also denies alcohol drinking for the past year.   Assessment & Plan:   Principal Problem:   Disorder of electrolytes Active Problems:   Atrial fibrillation, chronic (HCC)   Hypokalemia   Alcoholism (HCC)   Hypomagnesemia   Hypocalcemia   Benign prostatic hyperplasia   Hyponatremia   Tobacco abuse   COPD (chronic obstructive pulmonary disease) (HCC)   Leukocytosis   Urinary frequency  #1.  Hypokalemia, hypomagnesemia, hypocalcemia and hyponatremia. Phosphorus level appear to be normal. Etiology still unclear. Spoke with patient's son and the patient, patient has not been drinking alcohol for at least a year.  That is confirmed. Patient also not drinking excessive amount of fluids. No recent abdominal pain nausea vomiting or diarrhea. He does not take any diuretics for his blood pressure. Conditions more likely due to renal wasting. We will continue supplement potassium and magnesium through IV.  He will need to go home with oral supplements.  Continue oral calcium as well as vitamin D. Has significant weakness, he will be seen by PT and OT.  #2.  Permanent atrial fibrillation. Continue beta-blocker.  Patient not on chronically anticoagulation.  3.  COPD. Stable.  #4.  Frequent urination. UA does not support a UTI.   Obtain bladder scan to rule out urinary retention.     DVT prophylaxis: Lovenox Code Status: Full Family Communication: Son updated Disposition Plan:  .   Status is: Inpatient  Remains inpatient appropriate because:Inpatient level of care appropriate due to severity of illness   Dispo: The patient is from: Home              Anticipated d/c is to: Home              Anticipated d/c date is: 1 day              Patient currently is not medically stable to d/c.        I/O last 3 completed shifts: In: 1429.4 [I.V.:1229.4; IV CVELFYBOF:751] Out: 200 [Urine:200] Total I/O In: 240 [P.O.:240] Out: 300 [Urine:300]     Consultants:   None  Procedures: None  Antimicrobials:None  Subjective: Patient has some generalized weakness, no dizziness. Denies any abdominal pain nausea vomiting or diarrhea. No short of breath or cough. No fever or chills. No dysuria hematuria pain No chest pain or palpitation. No headache or dizziness.  Objective: Vitals:   01/13/20 2330 01/14/20 0000 01/14/20 0506 01/14/20 0735  BP: (!) 177/114  (!) 161/94 (!) 153/84  Pulse: 92  94 88  Resp:  20 16 (!) 21  Temp: 98.3 F (36.8 C)  98 F (36.7 C) 97.9 F (36.6 C)  TempSrc: Oral   Axillary  SpO2: 94%  (!) 88% 90%  Weight:      Height:        Intake/Output Summary (Last 24 hours) at 01/14/2020 1331 Last data filed at 01/14/2020 [phone]  Gross per 24 hour  Intake 1469.37 ml  Output 300 ml  Net 1169.37 ml   Filed Weights   01/13/20 0325  Weight: 65.8 kg    Examination:  General exam: Appears calm and comfortable  Respiratory system: Clear to auscultation. Respiratory effort normal. Cardiovascular system: Irregular irregular, no JVD, murmurs, rubs, gallops or clicks. No pedal edema. Gastrointestinal system: Abdomen is nondistended, soft and nontender. No organomegaly or masses felt. Normal bowel sounds heard. Central nervous system: Alert and oriented. No focal neurological  deficits. Extremities: Symmetric  Skin: No rashes, lesions or ulcers Psychiatry:  Mood & affect appropriate.     Data Reviewed: I have personally reviewed following labs and imaging studies  CBC: Recent Labs  Lab 01/13/20 0329 01/14/20 0512  WBC 12.9* 12.7*  HGB 11.8* 10.9*  HCT 36.5* 32.7*  MCV 80.8 79.6*  PLT 352 852   Basic Metabolic Panel: Recent Labs  Lab 01/13/20 0329 01/14/20 0512  NA 133* 133*  K 2.3* 3.4*  CL 86* 91*  CO2 33* 32  GLUCOSE 175* 95  BUN 18 20  CREATININE 0.99 0.79  CALCIUM 6.2* 6.7*  MG 0.4* 1.2*  PHOS 3.6  --    GFR: Estimated Creatinine Clearance: 65.1 mL/min (by C-G formula based on SCr of 0.79 mg/dL). Liver Function Tests: Recent Labs  Lab 01/13/20 0329  AST 28  ALT 15  ALKPHOS 64  BILITOT 0.6  PROT 6.0*  ALBUMIN 2.9*   Recent Labs  Lab 01/13/20 0329  LIPASE 21   No results for input(s): AMMONIA in the last 168 hours. Coagulation Profile: No results for input(s): INR, PROTIME in the last 168 hours. Cardiac Enzymes: No results for input(s): CKTOTAL, CKMB, CKMBINDEX, TROPONINI in the last 168 hours. BNP (last 3 results) No results for input(s): PROBNP in the last 8760 hours. HbA1C: No results for input(s): HGBA1C in the last 72 hours. CBG: No results for input(s): GLUCAP in the last 168 hours. Lipid Profile: No results for input(s): CHOL, HDL, LDLCALC, TRIG, CHOLHDL, LDLDIRECT in the last 72 hours. Thyroid Function Tests: No results for input(s): TSH, T4TOTAL, FREET4, T3FREE, THYROIDAB in the last 72 hours. Anemia Panel: No results for input(s): VITAMINB12, FOLATE, FERRITIN, TIBC, IRON, RETICCTPCT in the last 72 hours. Sepsis Labs: No results for input(s): PROCALCITON, LATICACIDVEN in the last 168 hours.  Recent Results (from the past 240 hour(s))  Urine Culture     Status: None   Collection Time: 01/13/20  3:29 AM   Specimen: Urine, Random  Result Value Ref Range Status   Specimen Description   Final    URINE,  RANDOM Performed at [facility], [address], [place], [place] [address]    Special Requests   Final    Normal Performed at [facility], [address] [place], [place] [address]    Culture   Final    NO GROWTH Performed at [facility], [place] [address], [place], [place] [address]    Report Status 01/14/2020 FINAL  Final  Resp Panel by RT-PCR (Flu A&B, Covid) Nasopharyngeal Swab     Status: None   Collection Time: 01/13/20  6:17 AM   Specimen: Nasopharyngeal Swab; Nasopharyngeal(NP) swabs in vial transport medium  Result Value Ref Range Status   SARS Coronavirus 2 by RT PCR NEGATIVE NEGATIVE Final    Comment: (NOTE) SARS-CoV-2 target nucleic acids are NOT DETECTED.  The SARS-CoV-2 RNA is generally detectable in upper respiratory specimens during the acute phase of infection.  The lowest concentration of SARS-CoV-2 viral copies this assay can detect is 138 copies/mL. A negative result does not preclude SARS-Cov-2 infection and should not be used as the sole basis for treatment or other patient management decisions. A negative result may occur with  improper specimen collection/handling, submission of specimen other than nasopharyngeal swab, presence of viral mutation(s) within the areas targeted by this assay, and inadequate number of viral copies(<138 copies/mL). A negative result must be combined with clinical observations, patient history, and epidemiological information. The expected result is Negative.  Fact Sheet for Patients:  EntrepreneurPulse.com.au  Fact Sheet for Healthcare Providers:  IncredibleEmployment.be  This test is no t yet approved or cleared by the [place] FDA and  has been authorized for detection and/or diagnosis of SARS-CoV-2 by FDA under an Emergency Use Authorization (EUA). This EUA will remain  in effect (meaning this test can be used) for the duration of the COVID-19  declaration under Section 564(b)(1) of the Act, 21 U.S.C.section 360bbb-3(b)(1), unless the authorization is terminated  or revoked sooner.       Influenza A by PCR NEGATIVE NEGATIVE Final   Influenza B by PCR NEGATIVE NEGATIVE Final    Comment: (NOTE) The Xpert Xpress SARS-CoV-2/FLU/RSV plus assay is intended as an aid in the diagnosis of influenza from Nasopharyngeal swab specimens and should not be used as a sole basis for treatment. Nasal washings and aspirates are unacceptable for Xpert Xpress SARS-CoV-2/FLU/RSV testing.  Fact Sheet for Patients: EntrepreneurPulse.com.au  Fact Sheet for Healthcare Providers: IncredibleEmployment.be  This test is not yet approved or cleared by the [place] FDA and has been authorized for detection and/or diagnosis of SARS-CoV-2 by FDA under an Emergency Use Authorization (EUA). This EUA will remain in effect (meaning this test can be used) for the duration of the COVID-19 declaration under Section 564(b)(1) of the Act, 21 U.S.C. section 360bbb-3(b)(1), unless the authorization is terminated or revoked.  Performed at [facility], [address], [place], [place] [address]          Radiology Studies: No results found.      Scheduled Meds: . aspirin  325 mg Oral Daily  . calcium carbonate  1 tablet Oral TID  . calcium citrate-vitamin D  1 tablet Oral Daily  . enoxaparin (LOVENOX) injection  40 mg Subcutaneous Q24H  . folic acid  1 mg Oral Daily  . Ipratropium-Albuterol  1 puff Inhalation Q6H  . magnesium oxide  400 mg Oral Daily  . metoprolol tartrate  25 mg Oral BID  . multivitamin with minerals  1 tablet Oral Daily  . nicotine  21 mg Transdermal Daily  . thiamine  100 mg Oral Daily   Or  . thiamine  100 mg Intravenous Daily   Continuous Infusions: . sodium chloride 75 mL/hr at 01/14/20 0400     LOS: 1 day    Time spent: 27 minutes    Sharen Hones, MD [facility]   To contact the attending provider between 7A-7P or the covering provider during after hours 7P-7A, please log into the web site [email or web address] and access using universal [place] password for that web site. If you do not have the password, please call the hospital operator.  01/14/2020, 1:31 PM

## 2020-01-14 NOTE — Progress Notes (Signed)
PT Cancellation Note  Patient Details Name: Daniel Edwards MRN: [id number] DOB: June 04, 1934   Cancelled Treatment:    Reason Eval/Treat Not Completed: Patient declined, no reason specified. Returned to attempt PT evaluation. Patient adamantly refusing and asked me not to come back today. Reports he "just doesn't want to."  Will possibly re-attempt tomorrow.    Amoni Morales 01/14/2020, 11:45 AM

## 2020-01-14 NOTE — Progress Notes (Signed)
OT Cancellation Note  Patient Details Name: Daniel Edwards MRN: [id number] DOB: 1934-04-29   Cancelled Treatment:    Reason Eval/Treat Not Completed: Patient declined, no reason specified\ Upon attempting to see pt at this time, pt reports he "has a lot on his mind" and states "not right now" when asked to participate in OT evaluation. OT educates and offers different aspects of services to no avail. Will f/u for evaluation at later date/time as agreeable/appropriate. Thank you.  Gerrianne Scale, [place], OTR/L ascom [phone] 01/14/20, 11:59 AM

## 2020-01-14 NOTE — Progress Notes (Signed)
Sharion Settler, NP notified via secure chat of CCMD call for 3 beat run of SVT with PVCs; patient asymptomatic. Awaiting acknowledgement. Barbaraann Faster, RN 12:38 AM 01/14/2020

## 2020-01-14 NOTE — Progress Notes (Signed)
PT Cancellation Note  Patient Details Name: Daniel Edwards MRN: [id number] DOB: [birth year]   Cancelled Treatment:    Reason Eval/Treat Not Completed: Patient declined, no reason specified. Will re-attempt later today for PT evaluation. RN notified.   Daesha Insco 01/14/2020, 9:55 AM

## 2020-01-15 DIAGNOSIS — R531 Weakness: Secondary | ICD-10-CM

## 2020-01-15 DIAGNOSIS — R197 Diarrhea, unspecified: Secondary | ICD-10-CM

## 2020-01-15 LAB — CBC WITH DIFFERENTIAL/PLATELET
Abs Immature Granulocytes: 0.11 10*3/uL — ABNORMAL HIGH (ref 0.00–0.07)
Basophils Absolute: 0 10*3/uL (ref 0.0–0.1)
Basophils Relative: 0 %
Eosinophils Absolute: 0 10*3/uL (ref 0.0–0.5)
Eosinophils Relative: 0 %
HCT: 35.5 % — ABNORMAL LOW (ref 39.0–52.0)
Hemoglobin: 11.5 g/dL — ABNORMAL LOW (ref 13.0–17.0)
Immature Granulocytes: 1 %
Lymphocytes Relative: 3 %
Lymphs Abs: 0.5 10*3/uL — ABNORMAL LOW (ref 0.7–4.0)
MCH: 26.1 pg (ref 26.0–34.0)
MCHC: 32.4 g/dL (ref 30.0–36.0)
MCV: 80.5 fL (ref 80.0–100.0)
Monocytes Absolute: 1.4 10*3/uL — ABNORMAL HIGH (ref 0.1–1.0)
Monocytes Relative: 7 %
Neutro Abs: 16.7 10*3/uL — ABNORMAL HIGH (ref 1.7–7.7)
Neutrophils Relative %: 89 %
Platelets: 269 10*3/uL (ref 150–400)
RBC: 4.41 MIL/uL (ref 4.22–5.81)
RDW: 15.4 % (ref 11.5–15.5)
WBC: 18.8 10*3/uL — ABNORMAL HIGH (ref 4.0–10.5)
nRBC: 0 % (ref 0.0–0.2)

## 2020-01-15 LAB — BASIC METABOLIC PANEL
Anion gap: 10 (ref 5–15)
BUN: 21 mg/dL (ref 8–23)
CO2: 31 mmol/L (ref 22–32)
Calcium: 7.4 mg/dL — ABNORMAL LOW (ref 8.9–10.3)
Chloride: 88 mmol/L — ABNORMAL LOW (ref 98–111)
Creatinine, Ser: 0.72 mg/dL (ref 0.61–1.24)
GFR, Estimated: >60 mL/min (ref >60)
Glucose, Bld: 123 mg/dL — ABNORMAL HIGH (ref 70–99)
Potassium: 3.5 mmol/L (ref 3.5–5.1)
Sodium: 129 mmol/L — ABNORMAL LOW (ref 135–145)

## 2020-01-15 LAB — MAGNESIUM: Magnesium: 2.1 mg/dL (ref 1.7–2.4)

## 2020-01-15 LAB — PHOSPHORUS: Phosphorus: 2.2 mg/dL — ABNORMAL LOW (ref 2.5–4.6)

## 2020-01-15 LAB — PARATHYROID HORMONE, INTACT (NO CA): PTH: 60 pg/mL (ref 15–65)

## 2020-01-15 LAB — CALCITRIOL (1,25 DI-OH VIT D): Vit D, 1,25-Dihydroxy: 37 pg/mL (ref 19.9–79.3)

## 2020-01-15 MED ORDER — SODIUM CHLORIDE 1 G PO TABS: 1.0000 g | ORAL_TABLET | Freq: Two times a day (BID) | ORAL | 0 refills | Status: AC

## 2020-01-15 MED ORDER — POTASSIUM PHOSPHATES 15 MMOLE/5ML IV SOLN
30.0000 mmol | Freq: Once | INTRAVENOUS | Status: DC
  Filled 2020-01-15: qty 10

## 2020-01-15 MED ORDER — ADULT MULTIVITAMIN W/MINERALS CH: 1.0000 | ORAL_TABLET | Freq: Every day | ORAL | Status: AC

## 2020-01-15 MED ORDER — FOLIC ACID 1 MG PO TABS: 1.0000 mg | ORAL_TABLET | Freq: Every day | ORAL | 0 refills | Status: AC

## 2020-01-15 MED ORDER — METOPROLOL TARTRATE 25 MG PO TABS: 25.0000 mg | ORAL_TABLET | Freq: Two times a day (BID) | ORAL | 0 refills | Status: AC

## 2020-01-15 MED ORDER — MAGNESIUM OXIDE 400 (241.3 MG) MG PO TABS: 400.0000 mg | ORAL_TABLET | Freq: Three times a day (TID) | ORAL | 0 refills | Status: AC

## 2020-01-15 MED ORDER — K PHOS MONO-SOD PHOS DI & MONO 155-852-130 MG PO TABS
500.0000 mg | ORAL_TABLET | ORAL | Status: DC
  Administered 2020-01-15: 500 mg via ORAL
  Filled 2020-01-15 (×2): qty 2

## 2020-01-15 MED ORDER — THIAMINE HCL 100 MG PO TABS: 100.0000 mg | ORAL_TABLET | Freq: Every day | ORAL | 0 refills | Status: AC

## 2020-01-15 MED ORDER — POTASSIUM CHLORIDE ER 10 MEQ PO TBCR: 10.0000 meq | EXTENDED_RELEASE_TABLET | Freq: Every day | ORAL | 0 refills | Status: AC

## 2020-01-15 MED ORDER — SODIUM CHLORIDE 1 G PO TABS
1.0000 g | ORAL_TABLET | Freq: Two times a day (BID) | ORAL | Status: DC
  Filled 2020-01-15 (×2): qty 1

## 2020-01-15 MED ORDER — LOPERAMIDE HCL 2 MG PO CAPS
4.0000 mg | ORAL_CAPSULE | Freq: Once | ORAL | Status: AC
  Administered 2020-01-15: 4 mg via ORAL
  Filled 2020-01-15: qty 2

## 2020-01-15 MED ORDER — K PHOS MONO-SOD PHOS DI & MONO 155-852-130 MG PO TABS: 500.0000 mg | ORAL_TABLET | ORAL | 0 refills | Status: AC

## 2020-01-15 MED ORDER — SPIRONOLACTONE 25 MG PO TABS: 25.0000 mg | ORAL_TABLET | Freq: Every day | ORAL | Status: DC

## 2020-01-15 MED ORDER — SPIRONOLACTONE 25 MG PO TABS: 25.0000 mg | ORAL_TABLET | Freq: Every day | ORAL | 0 refills | Status: AC

## 2020-01-15 NOTE — Evaluation (Signed)
Physical Therapy Evaluation Patient Details Name: Daniel Edwards MRN: [id number] DOB: [birth year] Today's Date: 01/15/2020   History of Present Illness  85 y.o. male with medical history significant of hypertension, COPD, GERD, BPH, UTI, atrial fibrillation not on anticoagulants, alcohol abuse in remission, tobacco abuse, who presents with Increased urinary frequency, shortness of breath, dizziness, generalized weakness, mild cough, mild shortness of breath.  Clinical Impression  Pt did well with PT exam and showed good confidence with mobility, gait.  He reports feeling somewhat weak from being in the hospital and agrees that he will benefit from continued use of AD on initial return home and also acknowledges that he would benefit from HHPT for a while as well.     Follow Up Recommendations Home health PT;Supervision - Intermittent    Equipment Recommendations  None recommended by PT    Recommendations for Other Services       Precautions / Restrictions Precautions Precautions: Fall Restrictions Weight Bearing Restrictions: No      Mobility  Bed Mobility Overal bed mobility: Independent             General bed mobility comments: Pt able to get up to EOB easily w/o assist    Transfers Overall transfer level: Modified independent Equipment used: Rolling walker (2 wheeled)             General transfer comment: Pt able to rise w/o physical assist, minimal unsteadiness with reliance on walker  Ambulation/Gait Ambulation/Gait assistance: Supervision Gait Distance (Feet): 200 Feet Assistive device: Rolling walker (2 wheeled)       General Gait Details: Pt able to ambulate around the central pod X 2 with consistent gait/cadence using walker much of the time.  Brief trial w/o AD (~20 ft) with decreased speed, stability, confidence but no LOBs.    Stairs            Wheelchair Mobility    Modified Rankin (Stroke Patients Only)       Balance Overall  balance assessment: Modified Independent                                           Pertinent Vitals/Pain Pain Assessment: No/denies pain    Home Living Family/patient expects to be discharged to:: Private residence Living Arrangements: Alone Available Help at Discharge: Family;Available PRN/intermittently Type of Home: House Home Access: Level entry (steps at the back, does not use)     Home Layout: One level Home Equipment: Walker - 2 wheels;Cane - single point      Prior Function Level of Independence: Independent         Comments: Ind Amb without an AD, one fall in the last 6 months, Ind with ADLs.     Hand Dominance        Extremity/Trunk Assessment   Upper Extremity Assessment Upper Extremity Assessment: Overall WFL for tasks assessed;Generalized weakness    Lower Extremity Assessment Lower Extremity Assessment: Overall WFL for tasks assessed;Generalized weakness       Communication   Communication: No difficulties  Cognition Arousal/Alertness: Awake/alert Behavior During Therapy: WFL for tasks assessed/performed Overall Cognitive Status: Within Functional Limits for tasks assessed                                        General  Comments      Exercises     Assessment/Plan    PT Assessment Patient needs continued PT services  PT Problem List Decreased strength;Decreased range of motion;Decreased activity tolerance;Decreased balance;Decreased mobility;Decreased safety awareness;Decreased knowledge of use of DME       PT Treatment Interventions DME instruction;Gait training;Functional mobility training;Therapeutic activities;Therapeutic exercise;Balance training;Patient/family education    PT Goals (Current goals can be found in the Care Plan section)  Acute Rehab PT Goals Patient Stated Goal: go home today PT Goal Formulation: With patient Time For Goal Achievement: 01/29/20 Potential to Achieve Goals: Good     Frequency Min 2X/week   Barriers to discharge        Co-evaluation               AM-PAC PT "6 Clicks" Mobility  Outcome Measure Help needed turning from your back to your side while in a flat bed without using bedrails?: None Help needed moving from lying on your back to sitting on the side of a flat bed without using bedrails?: None Help needed moving to and from a bed to a chair (including a wheelchair)?: None Help needed standing up from a chair using your arms (e.g., wheelchair or bedside chair)?: None Help needed to walk in hospital room?: A Little Help needed climbing 3-5 steps with a railing? : A Little 6 Click Score: 22    End of Session Equipment Utilized During Treatment: Gait belt Activity Tolerance: Patient limited by fatigue;Patient tolerated treatment well Patient left: with bed alarm set;with call bell/phone within reach Nurse Communication: Mobility status PT Visit Diagnosis: Muscle weakness (generalized) (M62.81);Difficulty in walking, not elsewhere classified (R26.2)    Time: 6270-3500 PT Time Calculation (min) (ACUTE ONLY): 19 min   Charges:   PT Evaluation $PT Eval Low Complexity: 1 Low PT Treatments $Gait Training: 8-22 mins        Kreg Shropshire, DPT 01/15/2020, 10:39 AM

## 2020-01-15 NOTE — Care Management (Signed)
TOC consult for substance abuse. Patient denies use of alcohol or need for substance abuse resources.  Yesterday it was reported he told the bedside nurse that he drinks on occasion, but that "it's nobody's business"  Patient has declined to work with PT and OT services during this admission.

## 2020-01-15 NOTE — Discharge Summary (Signed)
[facility] [place] NAME: Daniel Edwards    MR#:  [id number]  [place] OF BIRTH:  10/01/1934  DATE OF ADMISSION:  01/13/2020 ADMITTING PHYSICIAN: Ivor Costa, MD  DATE OF DISCHARGE: 01/15/2020 11:15 AM  PRIMARY CARE PHYSICIAN: None   ADMISSION DIAGNOSIS:  Hypocalcemia [E83.51] Urinary frequency [R35.0] Hypokalemia [E87.6] Hypomagnesemia [E83.42] Abnormal EKG [R94.31] Prolonged QT interval [R94.31] Disorder of electrolytes [E87.8]  DISCHARGE DIAGNOSIS:  Principal Problem:   Disorder of electrolytes Active Problems:   Atrial fibrillation, chronic (HCC)   Hypokalemia   Alcoholism ([place])   Hypomagnesemia   Hypocalcemia   Benign prostatic hyperplasia   Hyponatremia   Tobacco abuse   COPD (chronic obstructive pulmonary disease) (HCC)   Leukocytosis   Urinary frequency   SECONDARY DIAGNOSIS:   Past Medical History:  Diagnosis Date  . BPH (benign prostatic hyperplasia)   . COPD (chronic obstructive pulmonary disease) ([place])   . Erectile dysfunction   . Essential hypertension   . GERD (gastroesophageal reflux disease)   . Simple chronic bronchitis ([place])     HOSPITAL COURSE:   1.  Severe hypomagnesemia.  Patient was given IV magnesium replacement and now in the normal range.  Increase oral placement at home to 3 times daily dosing. 2.  Severe hypokalemia on presentation.  The patient will be given K-Phos today and oral potassium replacement tomorrow.  Start low-dose Aldactone. 3.  Hyponatremia we will give low-dose salt tablets. 4.  Hypocalcemia continue Tums replacement at home. 5.  Essential hypertension on low-dose beta-blocker.  Will add Aldactone. 6.  Chronic atrial fibrillation on beta-blocker.  Not on any anticoagulation besides full dose aspirin. 7.  Constipation then diarrhea.  Hold anticonstipation medications.  Increasing magnesium can also cause loose bowel movements. 8.  Hypophosphatemia.  K-Phos will be given for 2 doses  upon going home. 9.  Weakness physical therapy recommends home health with home health.  Recommending checking labs with follow-up appointment.  DISCHARGE CONDITIONS:   Satisfactory  CONSULTS OBTAINED:  None  DRUG ALLERGIES:  No Known Allergies  DISCHARGE MEDICATIONS:   Allergies as of 01/15/2020   No Known Allergies     Medication List    STOP taking these medications   diphenhydramine-acetaminophen 25-500 MG Tabs tablet Commonly known as: TYLENOL PM     TAKE these medications   aspirin 325 MG tablet Take 325 mg by mouth daily.   calcium carbonate 500 MG chewable tablet Commonly known as: TUMS - dosed in mg elemental calcium Chew 1 tablet (200 mg of elemental calcium total) by mouth 3 (three) times daily.   folic acid 1 MG tablet Commonly known as: FOLVITE Take 1 tablet (1 mg total) by mouth daily. Start taking on: January 16, 2020   Ipratropium-Albuterol 20-100 MCG/ACT Aers respimat Commonly known as: COMBIVENT Inhale 1 puff into the lungs every 6 (six) hours.   magnesium oxide 400 (241.3 Mg) MG tablet Commonly known as: MAG-OX Take 1 tablet (400 mg total) by mouth 3 (three) times daily before meals. What changed: when to take this   metoprolol tartrate 25 MG tablet Commonly known as: LOPRESSOR Take 1 tablet (25 mg total) by mouth 2 (two) times daily.   multivitamin with minerals Tabs tablet Take 1 tablet by mouth daily. Start taking on: January 16, 2020   phosphorus [phone] MG tablet Commonly known as: K PHOS NEUTRAL Take 2 tablets (500 mg total) by mouth every 4 (four) hours for 2 doses.   potassium chloride  10 MEQ tablet Commonly known as: KLOR-CON Take 1 tablet (10 mEq total) by mouth daily for 5 days.   sodium chloride 1 g tablet Take 1 tablet (1 g total) by mouth 2 (two) times daily with a meal.   spironolactone 25 MG tablet Commonly known as: ALDACTONE Take 1 tablet (25 mg total) by mouth daily.   thiamine 100 MG tablet Take 1  tablet (100 mg total) by mouth daily. Start taking on: January 16, 2020        DISCHARGE INSTRUCTIONS:   Follow-up with new medical doctor soon as possible  If you experience worsening of your admission symptoms, develop shortness of breath, life threatening emergency, suicidal or homicidal thoughts you must seek medical attention immediately by calling 911 or calling your MD immediately  if symptoms less severe.  You Must read complete instructions/literature along with all the possible adverse reactions/side effects for all the Medicines you take and that have been prescribed to you. Take any new Medicines after you have completely understood and accept all the possible adverse reactions/side effects.   Please note  You were cared for by a hospitalist during your hospital stay. If you have any questions about your discharge medications or the care you received while you were in the hospital after you are discharged, you can call the unit and asked to speak with the hospitalist on call if the hospitalist that took care of you is not available. Once you are discharged, your primary care physician will handle any further medical issues. Please note that NO REFILLS for any discharge medications will be authorized once you are discharged, as it is imperative that you return to your primary care physician (or establish a relationship with a primary care physician if you do not have one) for your aftercare needs so that they can reassess your need for medications and monitor your lab values.    Today   CHIEF COMPLAINT:   Chief Complaint  Patient presents with  . Urinary Frequency    HISTORY OF PRESENT ILLNESS:  Daniel Edwards  is a 85 y.o. male came in not feeling well at all.   VITAL SIGNS:  Blood pressure (!) 114/92, pulse 93, temperature 97.6 F (36.4 C), temperature source Oral, resp. rate 20, height 5\' 10"  (1.778 m), weight 65.8 kg, SpO2 90 %.  I/O:    Intake/Output Summary  (Last 24 hours) at 01/15/2020 1608 Last data filed at 01/15/2020 0500 Gross per 24 hour  Intake 960 ml  Output 500 ml  Net 460 ml    PHYSICAL EXAMINATION:  GENERAL:  85 y.o.-year-old patient lying in the bed with no acute distress.  EYES: Pupils equal, round, reactive to light and accommodation. No scleral icterus.  HEENT: Head atraumatic, normocephalic. Oropharynx and nasopharynx clear.  LUNGS: Normal breath sounds bilaterally, no wheezing, rales,rhonchi or crepitation. No use of accessory muscles of respiration.  CARDIOVASCULAR: S1, S2 normal. No murmurs, rubs, or gallops.  ABDOMEN: Soft, non-tender, non-distended. Bowel sounds present. No organomegaly or mass.  EXTREMITIES: No pedal edema, cyanosis, or clubbing.  NEUROLOGIC: Cranial nerves II through XII are intact. Muscle strength 5/5 in all extremities. Sensation intact. Gait not checked.  PSYCHIATRIC: The patient is alert and oriented x 3.  SKIN: No obvious rash, lesion, or ulcer.   DATA REVIEW:   CBC Recent Labs  Lab 01/15/20 0431  WBC 18.8*  HGB 11.5*  HCT 35.5*  PLT 269    Chemistries  Recent Labs  Lab 01/13/20 0329 01/14/20  3810 01/15/20 0431  NA 133*   < > 129*  K 2.3*   < > 3.5  CL 86*   < > 88*  CO2 33*   < > 31  GLUCOSE 175*   < > 123*  BUN 18   < > 21  CREATININE 0.99   < > 0.72  CALCIUM 6.2*   < > 7.4*  MG 0.4*   < > 2.1  AST 28  --   --   ALT 15  --   --   ALKPHOS 64  --   --   BILITOT 0.6  --   --    < > = values in this interval not displayed.    Microbiology Results  Results for orders placed or performed during the hospital encounter of 01/13/20  Urine Culture     Status: None   Collection Time: 01/13/20  3:29 AM   Specimen: Urine, Random  Result Value Ref Range Status   Specimen Description   Final    URINE, RANDOM Performed at [facility], [address], [place], [place] [address]    Special Requests   Final    Normal Performed at [facility], [address] [place], [place] [address]    Culture   Final    NO GROWTH Performed at [facility], [place] [address], [place], [place] [address]    Report Status 01/14/2020 FINAL  Final  Resp Panel by RT-PCR (Flu A&B, Covid) Nasopharyngeal Swab     Status: None   Collection Time: 01/13/20  6:17 AM   Specimen: Nasopharyngeal Swab; Nasopharyngeal(NP) swabs in vial transport medium  Result Value Ref Range Status   SARS Coronavirus 2 by RT PCR NEGATIVE NEGATIVE Final    Comment: (NOTE) SARS-CoV-2 target nucleic acids are NOT DETECTED.  The SARS-CoV-2 RNA is generally detectable in upper respiratory specimens during the acute phase of infection. The lowest concentration of SARS-CoV-2 viral copies this assay can detect is 138 copies/mL. A negative result does not preclude SARS-Cov-2 infection and should not be used as the sole basis for treatment or other patient management decisions. A negative result may occur with  improper specimen collection/handling, submission of specimen other than nasopharyngeal swab, presence of viral mutation(s) within the areas targeted by this assay, and inadequate number of viral copies(<138 copies/mL). A negative result must be combined with clinical observations, patient history, and epidemiological information. The expected result is Negative.  Fact Sheet for Patients:  EntrepreneurPulse.com.au  Fact Sheet for Healthcare Providers:  IncredibleEmployment.be  This test is no t yet approved or cleared by the [place] FDA and  has been authorized for detection and/or diagnosis of SARS-CoV-2 by FDA under an Emergency Use Authorization (EUA). This EUA will remain  in effect (meaning this test can be used) for the duration of the COVID-19 declaration under Section 564(b)(1) of the Act, 21 U.S.C.section 360bbb-3(b)(1), unless the authorization is terminated  or revoked sooner.       Influenza A by PCR NEGATIVE NEGATIVE  Final   Influenza B by PCR NEGATIVE NEGATIVE Final    Comment: (NOTE) The Xpert Xpress SARS-CoV-2/FLU/RSV plus assay is intended as an aid in the diagnosis of influenza from Nasopharyngeal swab specimens and should not be used as a sole basis for treatment. Nasal washings and aspirates are unacceptable for Xpert Xpress SARS-CoV-2/FLU/RSV testing.  Fact Sheet for Patients: EntrepreneurPulse.com.au  Fact Sheet for Healthcare Providers: IncredibleEmployment.be  This test is not yet  approved or cleared by the [place] and has been authorized for detection and/or diagnosis of SARS-CoV-2 by FDA under an Emergency Use Authorization (EUA). This EUA will remain in effect (meaning this test can be used) for the duration of the COVID-19 declaration under Section 564(b)(1) of the Act, 21 U.S.C. section 360bbb-3(b)(1), unless the authorization is terminated or revoked.  Performed at [facility], [address] [place], [place] [address]      Management plans discussed with the patient, and he told me he is going home because he feels better.  I spoke with the patient's son.  CODE STATUS:     Code Status Orders  (From admission, onward)         Start     Ordered   01/13/20 0831  Full code  Continuous        01/13/20 0830        Code Status History    Date Active Date Inactive Code Status Order ID Comments User Context   02/26/2019 1517 03/01/2019 2034 Full Code [id number]  Sidney Ace, MD ED   01/25/2019 0224 01/31/2019 1742 Full Code [id number]  Elwyn Reach, MD ED   11/15/2018 1629 11/21/2018 2106 Full Code [id number]  Demetrios Loll, MD Inpatient   07/20/2015 1512 07/21/2015 1601 Full Code [id number]  Hillary Bow, MD ED   02/19/2015 0724 02/20/2015 1757 Full Code [id number]  Harrie Foreman, MD Inpatient   Advance Care Planning Activity    Advance Directive Documentation     Most Recent Value  Type of Advance  Directive Healthcare Power of Attorney  Pre-existing out of facility DNR order (yellow form or pink MOST form) --  "MOST" Form in Place? --      TOTAL TIME TAKING CARE OF THIS PATIENT: 35 minutes.    Loletha Grayer M.D on 01/15/2020 at 4:08 PM  Between 7am to 6pm - Pager - [phone]  After 6pm go to [email or web address] - password [email or web address]  [facility]  CC: Primary care physician; none

## 2020-02-04 ENCOUNTER — Other Ambulatory Visit: Payer: Self-pay | Admitting: Internal Medicine

## 2020-02-04 DIAGNOSIS — R600 Localized edema: Secondary | ICD-10-CM | POA: Diagnosis not present

## 2020-02-04 DIAGNOSIS — F17210 Nicotine dependence, cigarettes, uncomplicated: Secondary | ICD-10-CM | POA: Diagnosis not present

## 2020-02-04 DIAGNOSIS — F1010 Alcohol abuse, uncomplicated: Secondary | ICD-10-CM | POA: Diagnosis not present

## 2020-02-04 DIAGNOSIS — J449 Chronic obstructive pulmonary disease, unspecified: Secondary | ICD-10-CM | POA: Diagnosis not present

## 2020-02-04 DIAGNOSIS — Z0001 Encounter for general adult medical examination with abnormal findings: Secondary | ICD-10-CM | POA: Diagnosis not present

## 2020-02-04 DIAGNOSIS — E441 Mild protein-calorie malnutrition: Secondary | ICD-10-CM | POA: Diagnosis not present

## 2020-02-04 DIAGNOSIS — I4821 Permanent atrial fibrillation: Secondary | ICD-10-CM | POA: Diagnosis not present

## 2020-02-04 DIAGNOSIS — I10 Essential (primary) hypertension: Secondary | ICD-10-CM | POA: Diagnosis not present

## 2020-02-11 DIAGNOSIS — E441 Mild protein-calorie malnutrition: Secondary | ICD-10-CM | POA: Diagnosis not present

## 2020-02-11 DIAGNOSIS — Z720 Tobacco use: Secondary | ICD-10-CM | POA: Diagnosis not present

## 2020-02-11 DIAGNOSIS — R600 Localized edema: Secondary | ICD-10-CM | POA: Diagnosis not present

## 2020-02-11 DIAGNOSIS — F1010 Alcohol abuse, uncomplicated: Secondary | ICD-10-CM | POA: Diagnosis not present

## 2020-03-10 DEATH — deceased

## 2020-10-28 IMAGING — CR DG ELBOW COMPLETE 3+V*R*
1 series · 4 of 4 positions shown · non-contrast
Comparison: None.

CLINICAL DATA: Right elbow pain status post fall

EXAM:
RIGHT ELBOW - COMPLETE 3+ VIEW

[Series 1: dg elbow complete right (3+view) · 0.14mm/px · 4 of 4 slices shown]
[im 1/4]
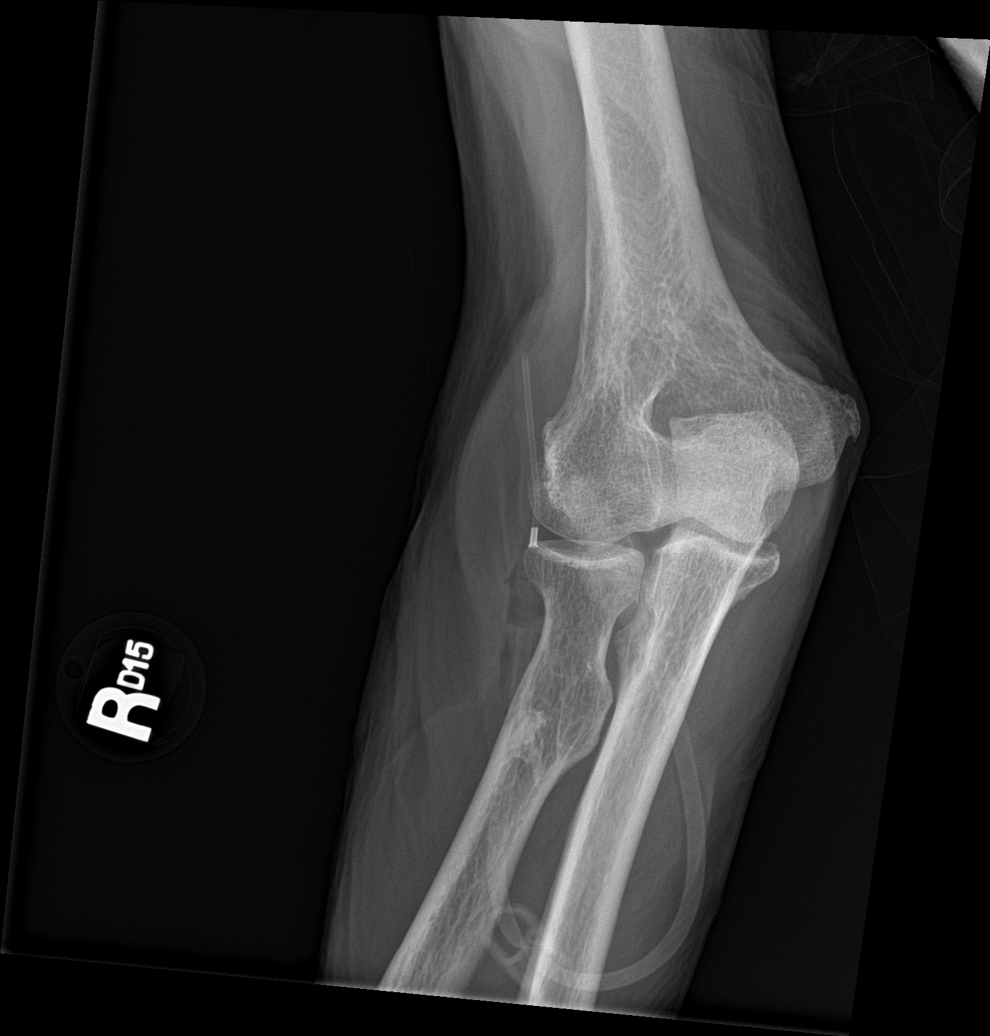
[im 2/4]
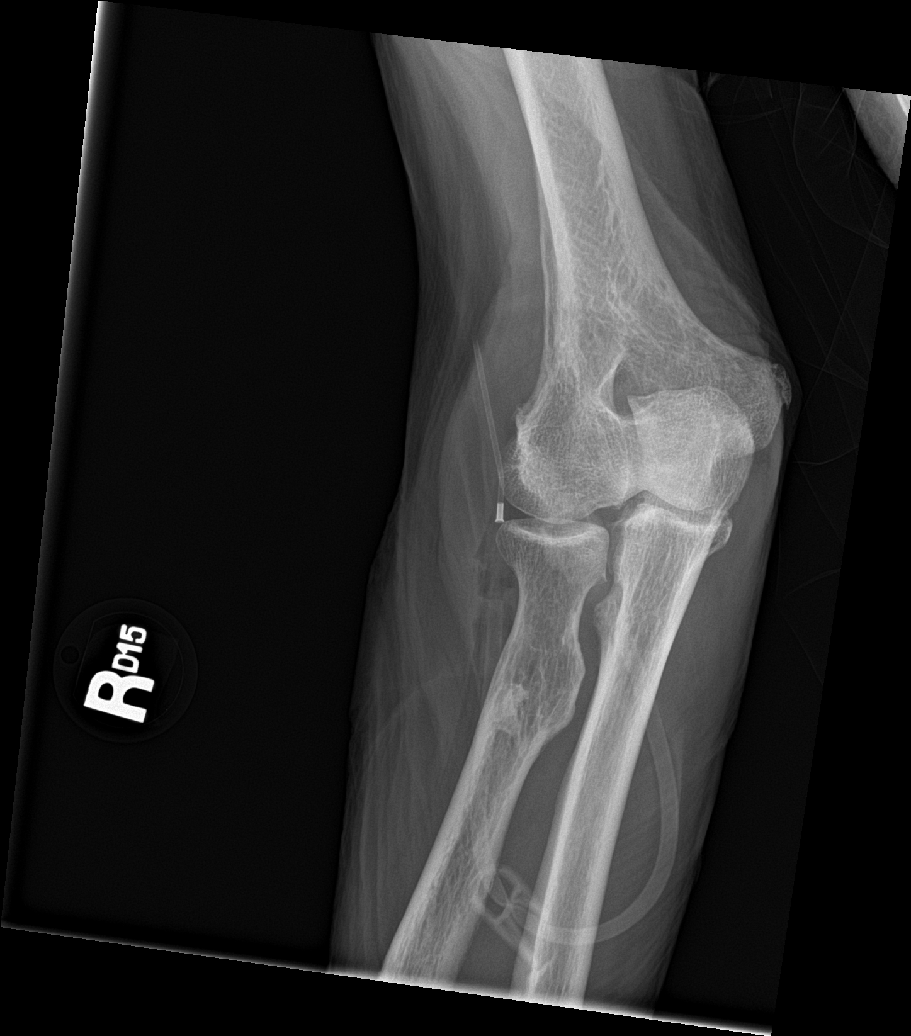
[im 3/4]
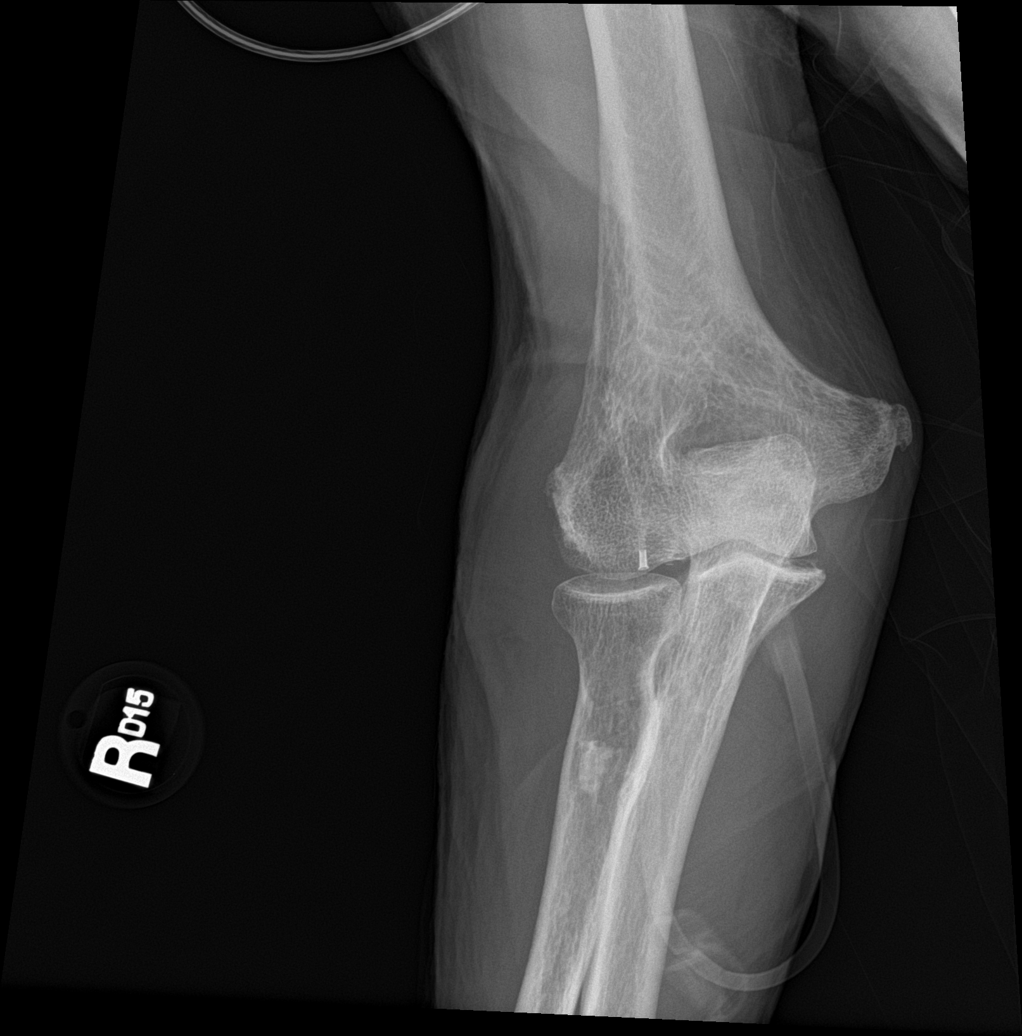
[im 4/4]
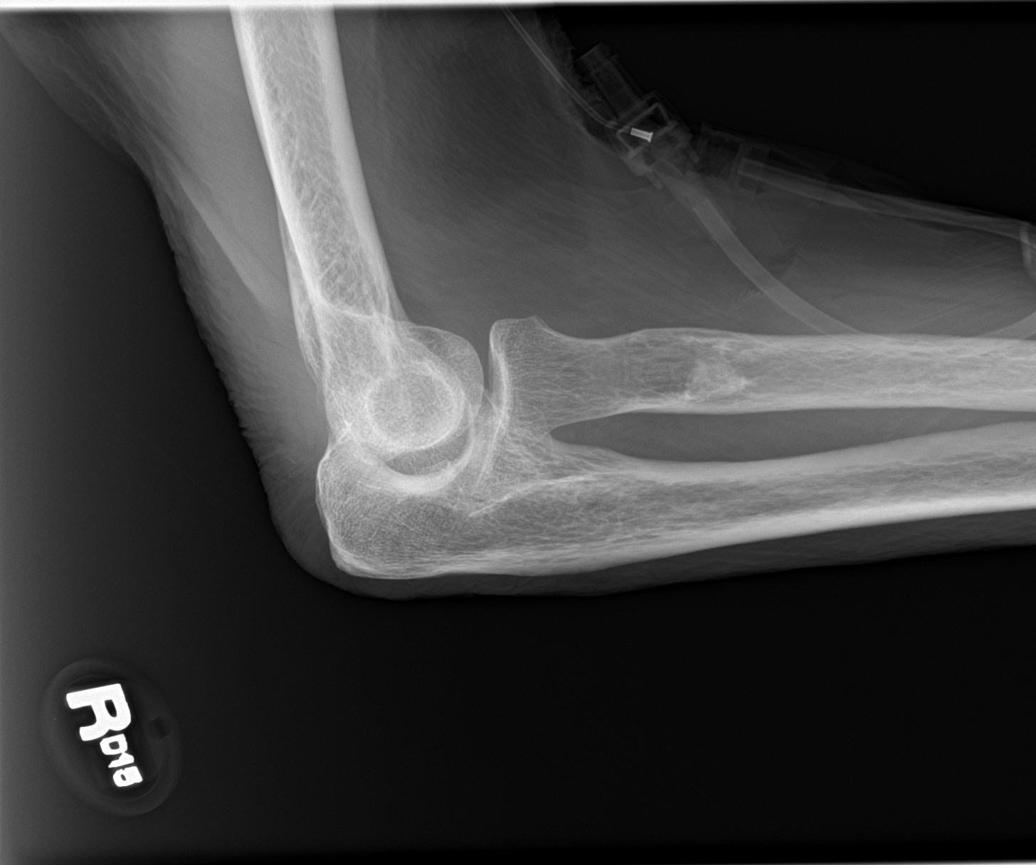

[4 of 4 positions shown; findings below may reference images not displayed]

FINDINGS: There is no evidence of fracture, dislocation, or joint effusion.
There is no evidence of arthropathy or other focal bone abnormality.
Soft tissues are unremarkable.
IMPRESSION: No acute osseous injury of the right elbow.

## 2020-10-28 IMAGING — CR DG HAND COMPLETE 3+V*R*
1 series · 3 of 3 positions shown · non-contrast
Comparison: None.

CLINICAL DATA: Status post fall, right hand pain

EXAM:
RIGHT HAND - COMPLETE 3+ VIEW

[Series 1: dg hand complete right · 0.14mm/px · 3 of 3 slices shown]
[im 1/3]
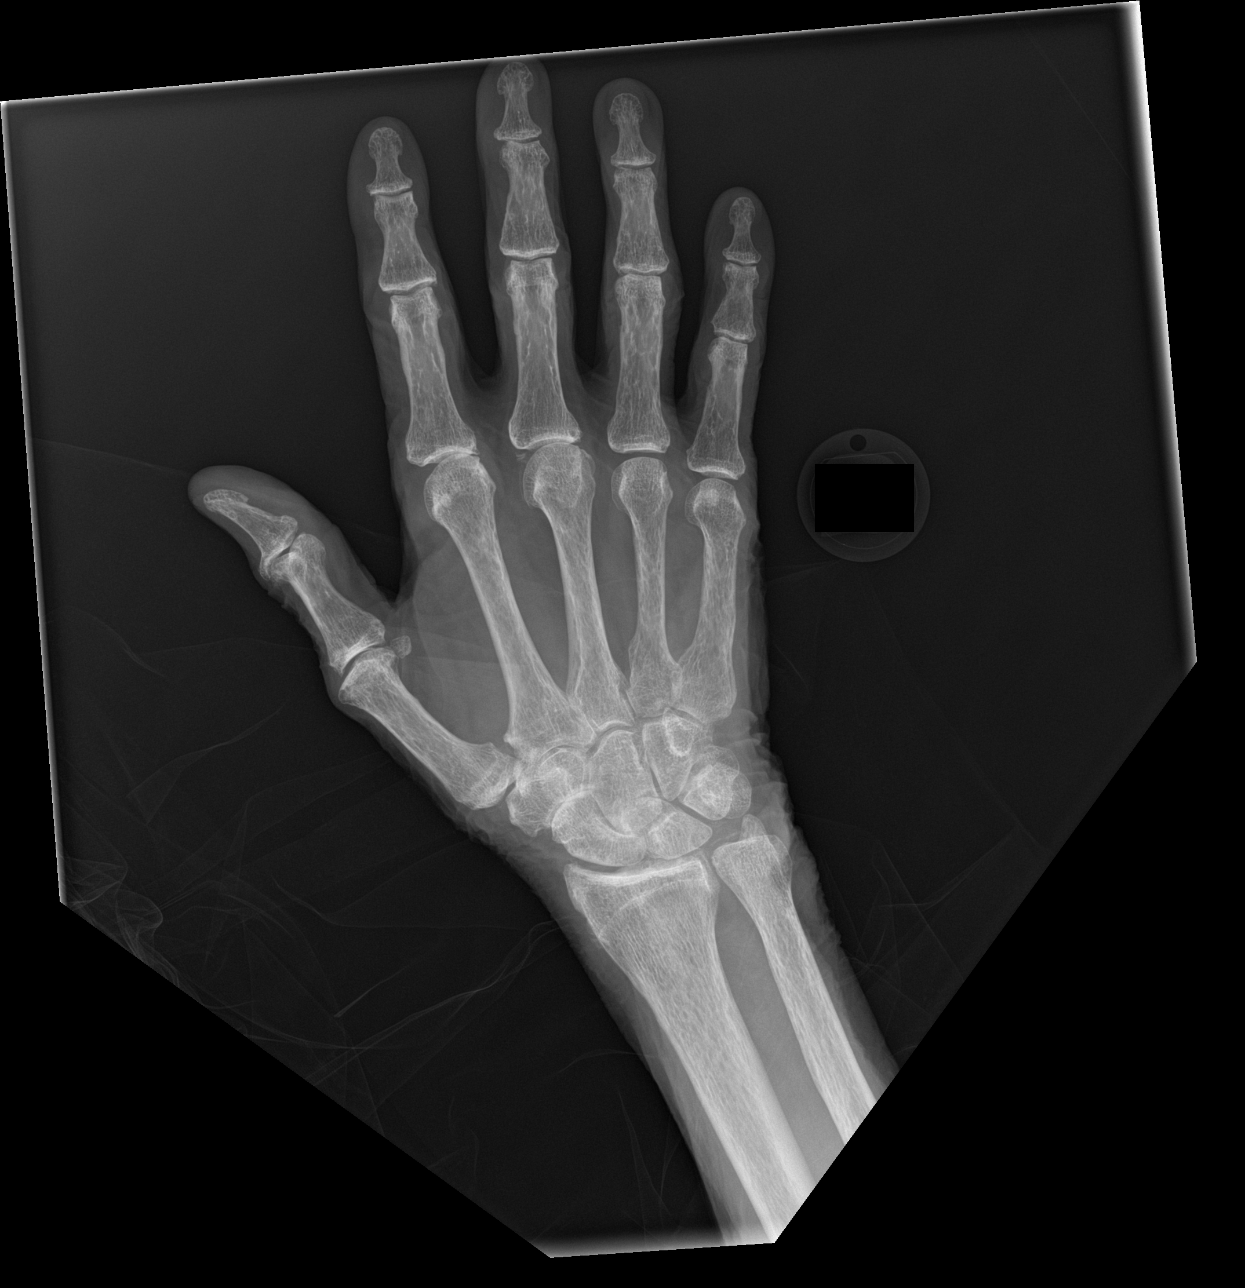
[im 2/3]
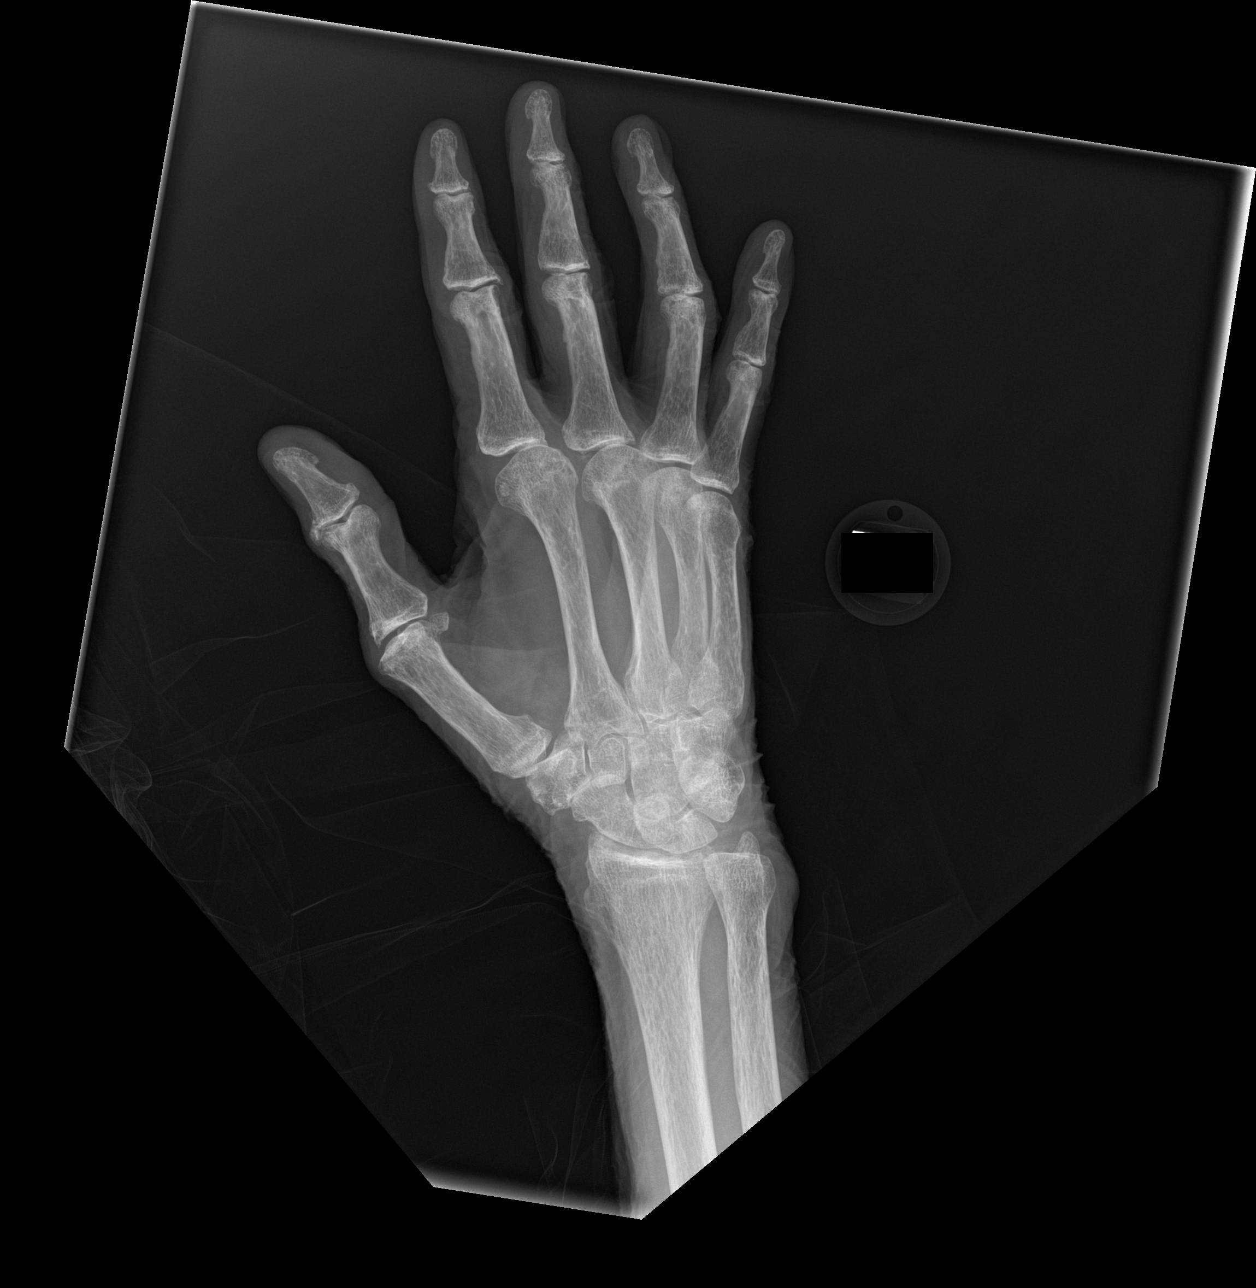
[im 3/3]
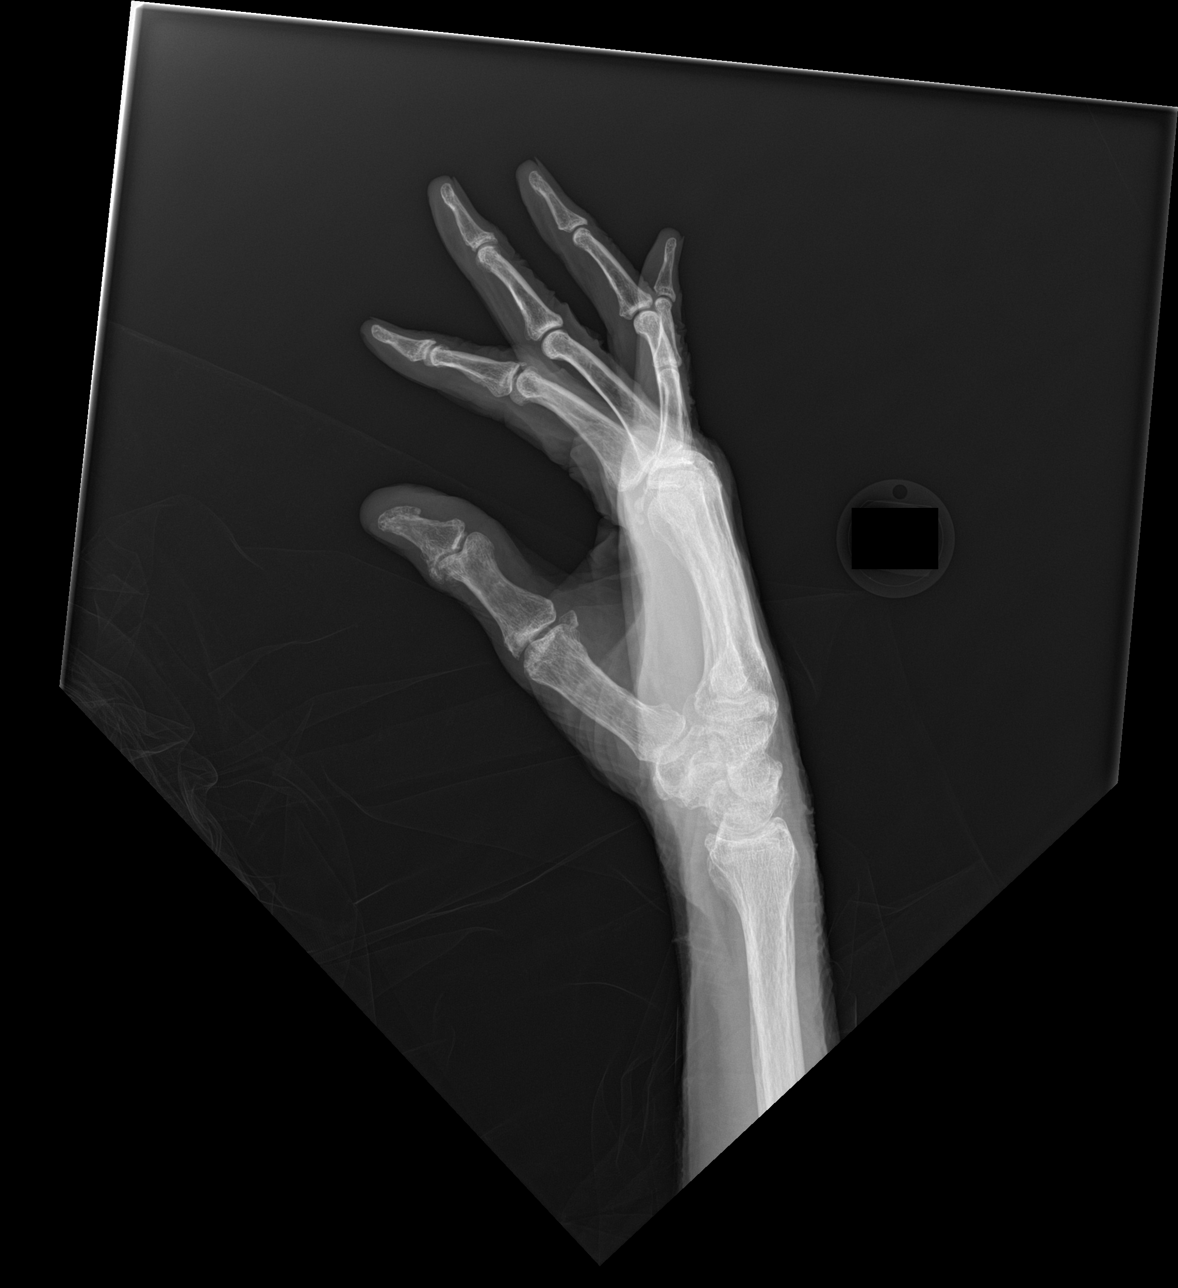

[3 of 3 positions shown; findings below may reference images not displayed]

FINDINGS: Generalized osteopenia. No fracture or dislocation. No aggressive
osseous lesion. Moderate osteoarthritis of the
scaphotrapeziotrapezoid joint. Mild osteoarthritis of the first CMC
joint and first MCP joint. Moderate osteoarthritis of the first IP
joint. Chondrocalcinosis of TFCC as can be seen with CPPD.
Chondrocalcinosis of the third MCP joint. Mild joint space narrowing
of the second and third MCP joints with small marginal osteophytes.
No soft tissue abnormality.
IMPRESSION: 1.  No acute osseous injury of the right hand.
2. Osteoarthritis of the right hand as described above.

## 2020-11-01 IMAGING — CR DG CHEST 2V
2 series · 3 of 3 positions shown · non-contrast
Comparison: 11/18/2018

CLINICAL DATA: Hypoxia, COPD

EXAM:
CHEST - 2 VIEW

[Series 2: chest lat · 0.14mm/px · 2 of 2 slices shown]
[im 1/2]
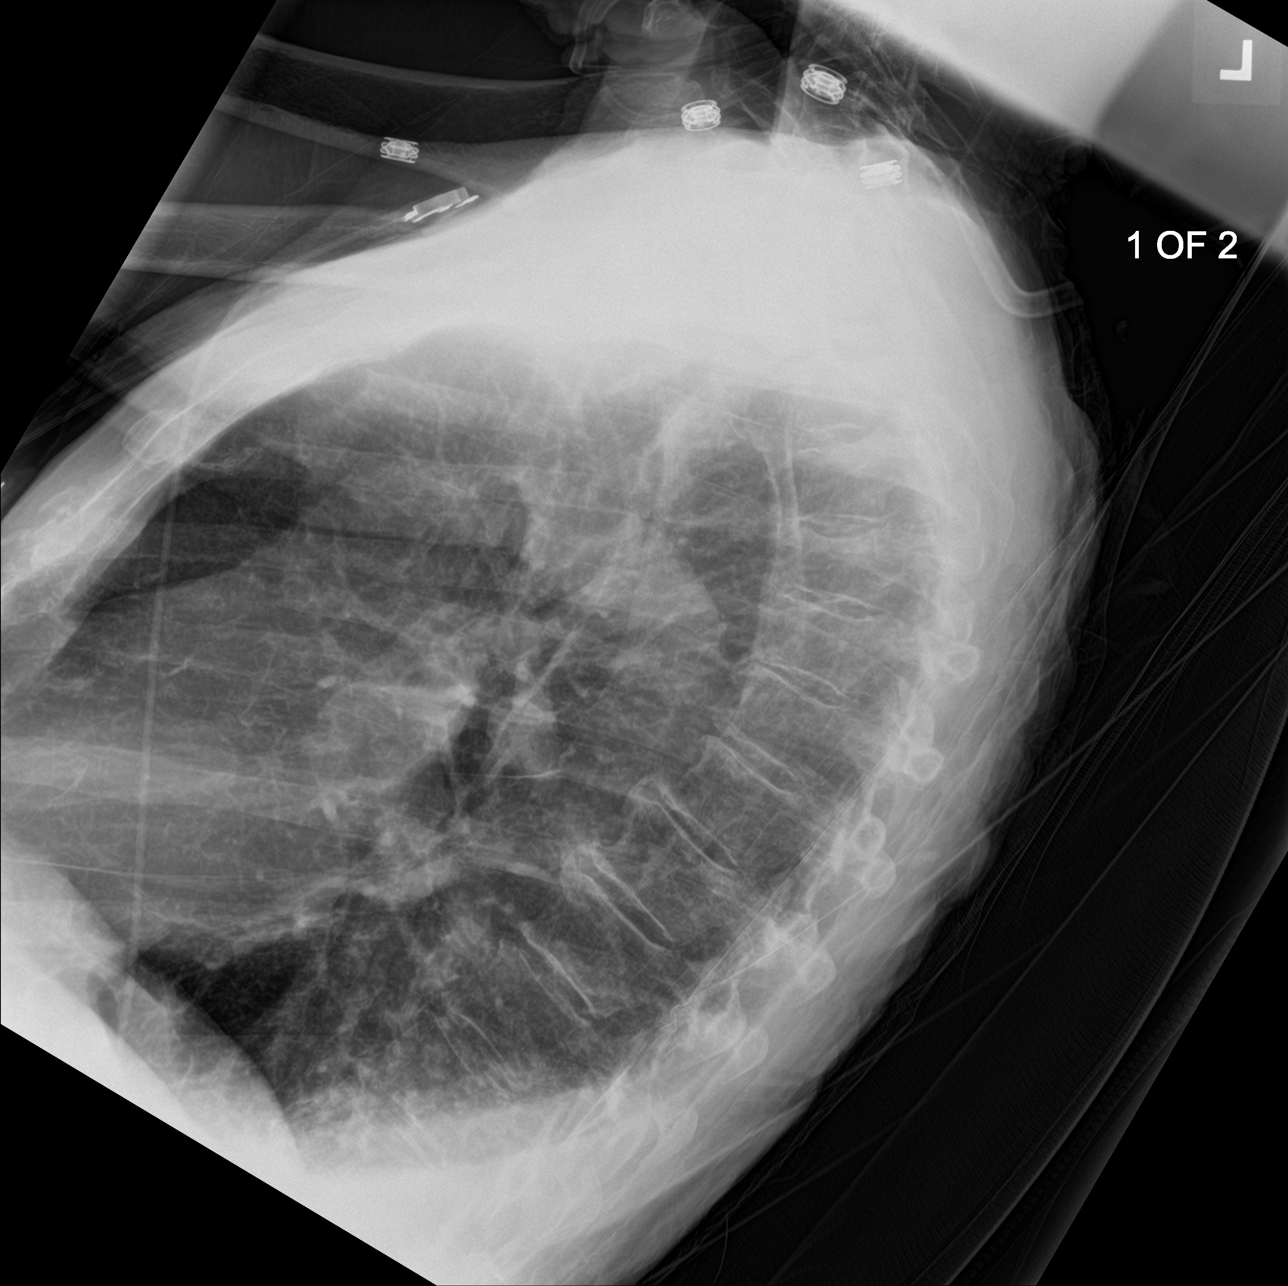
[im 2/2]
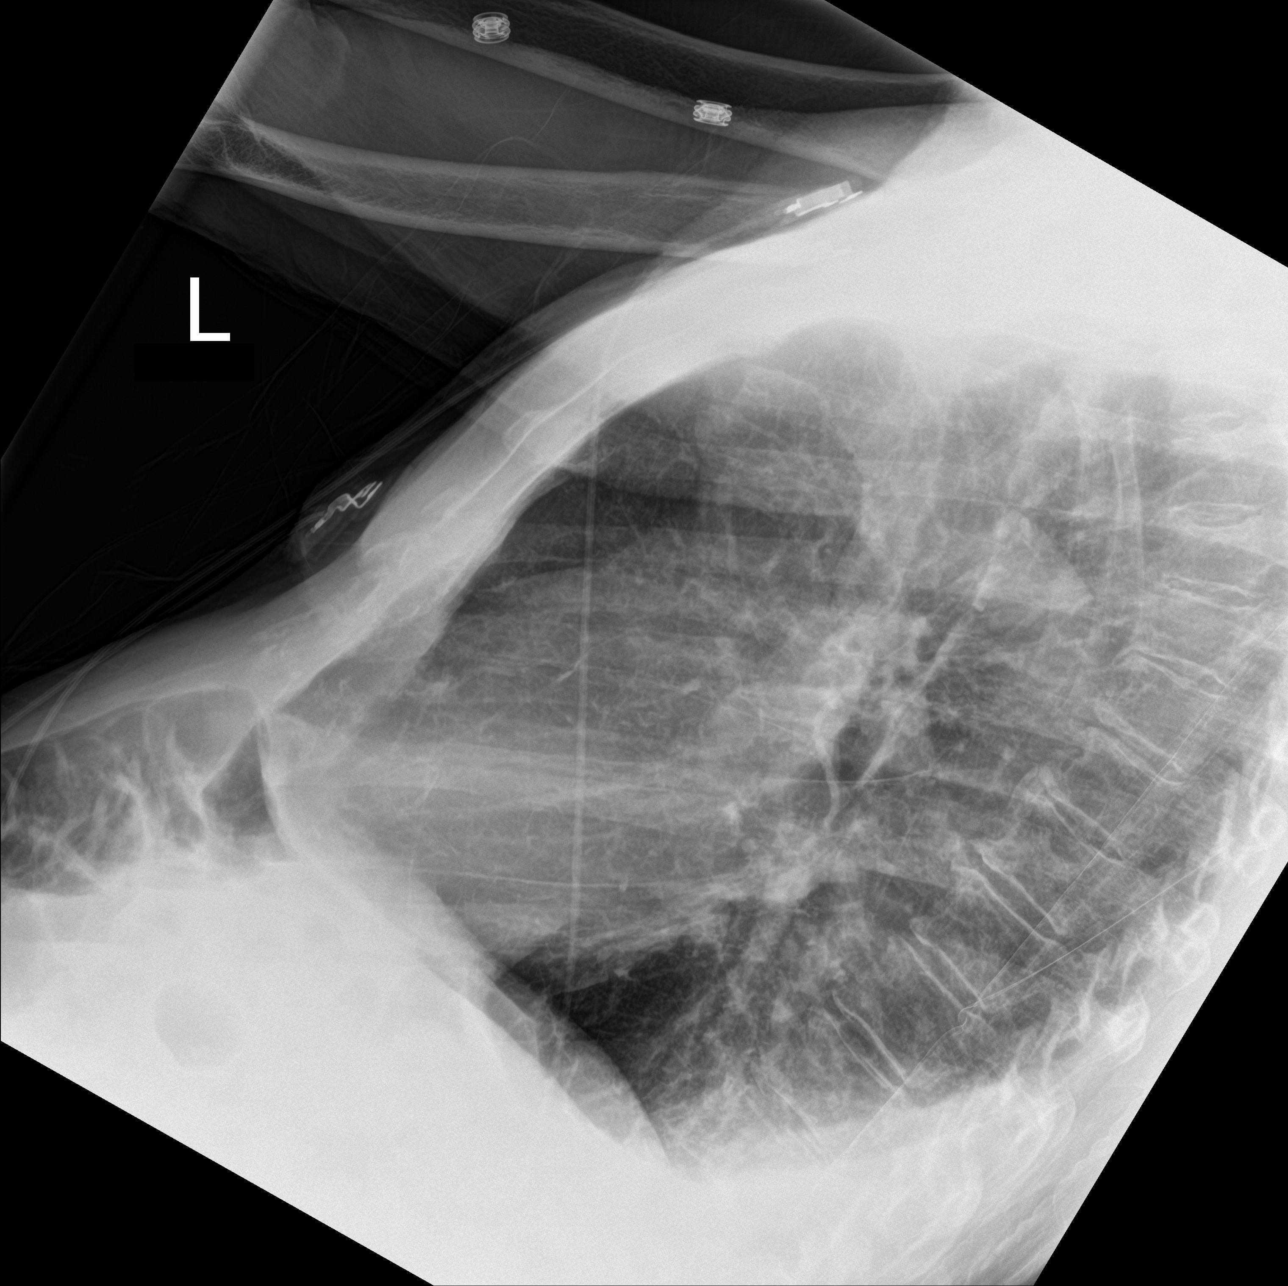

[chest ap]
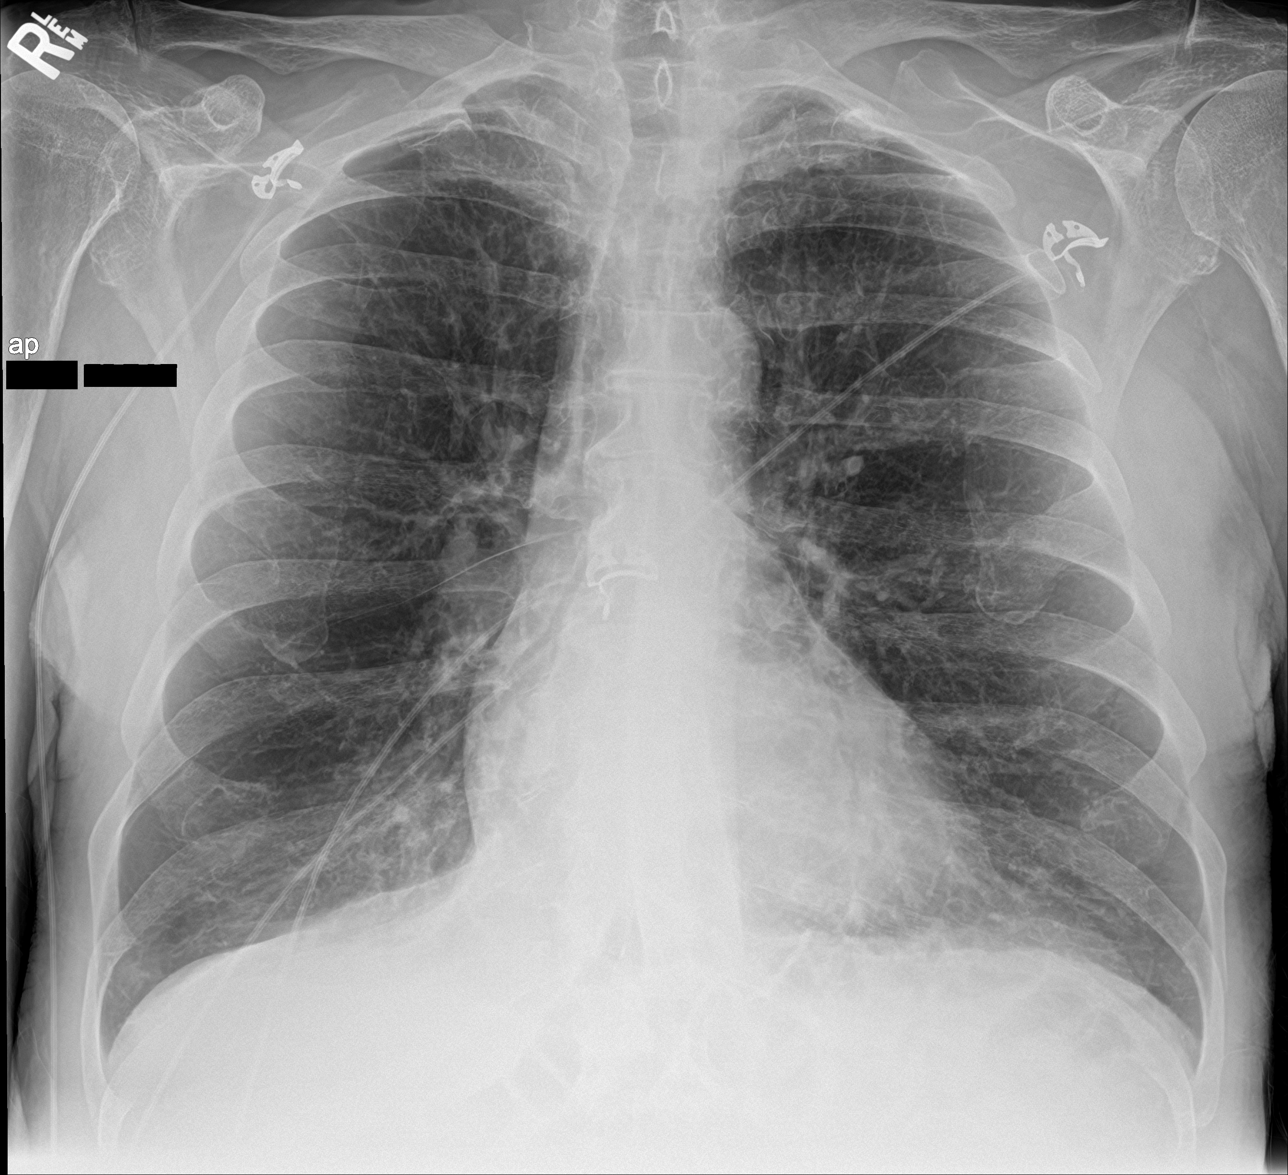

[3 of 3 positions shown; findings below may reference images not displayed]

FINDINGS: There is hyperinflation of the lungs compatible with COPD. Stable
increased markings, particularly at the lung bases, favor chronic
lung disease. Small layering effusions noted on the lateral view.
Heart is normal size. No acute bony abnormality.
IMPRESSION: Small bilateral pleural effusions.

COPD/chronic changes.

## 2021-01-03 IMAGING — CR DG CHEST 2V
2 series · 2 of 2 positions shown · non-contrast
Comparison: Radiograph 11/19/2018

CLINICAL DATA: Atrial fibrillation, worsening sinus pain for 3 days

EXAM:
CHEST - 2 VIEW

[chest pa]
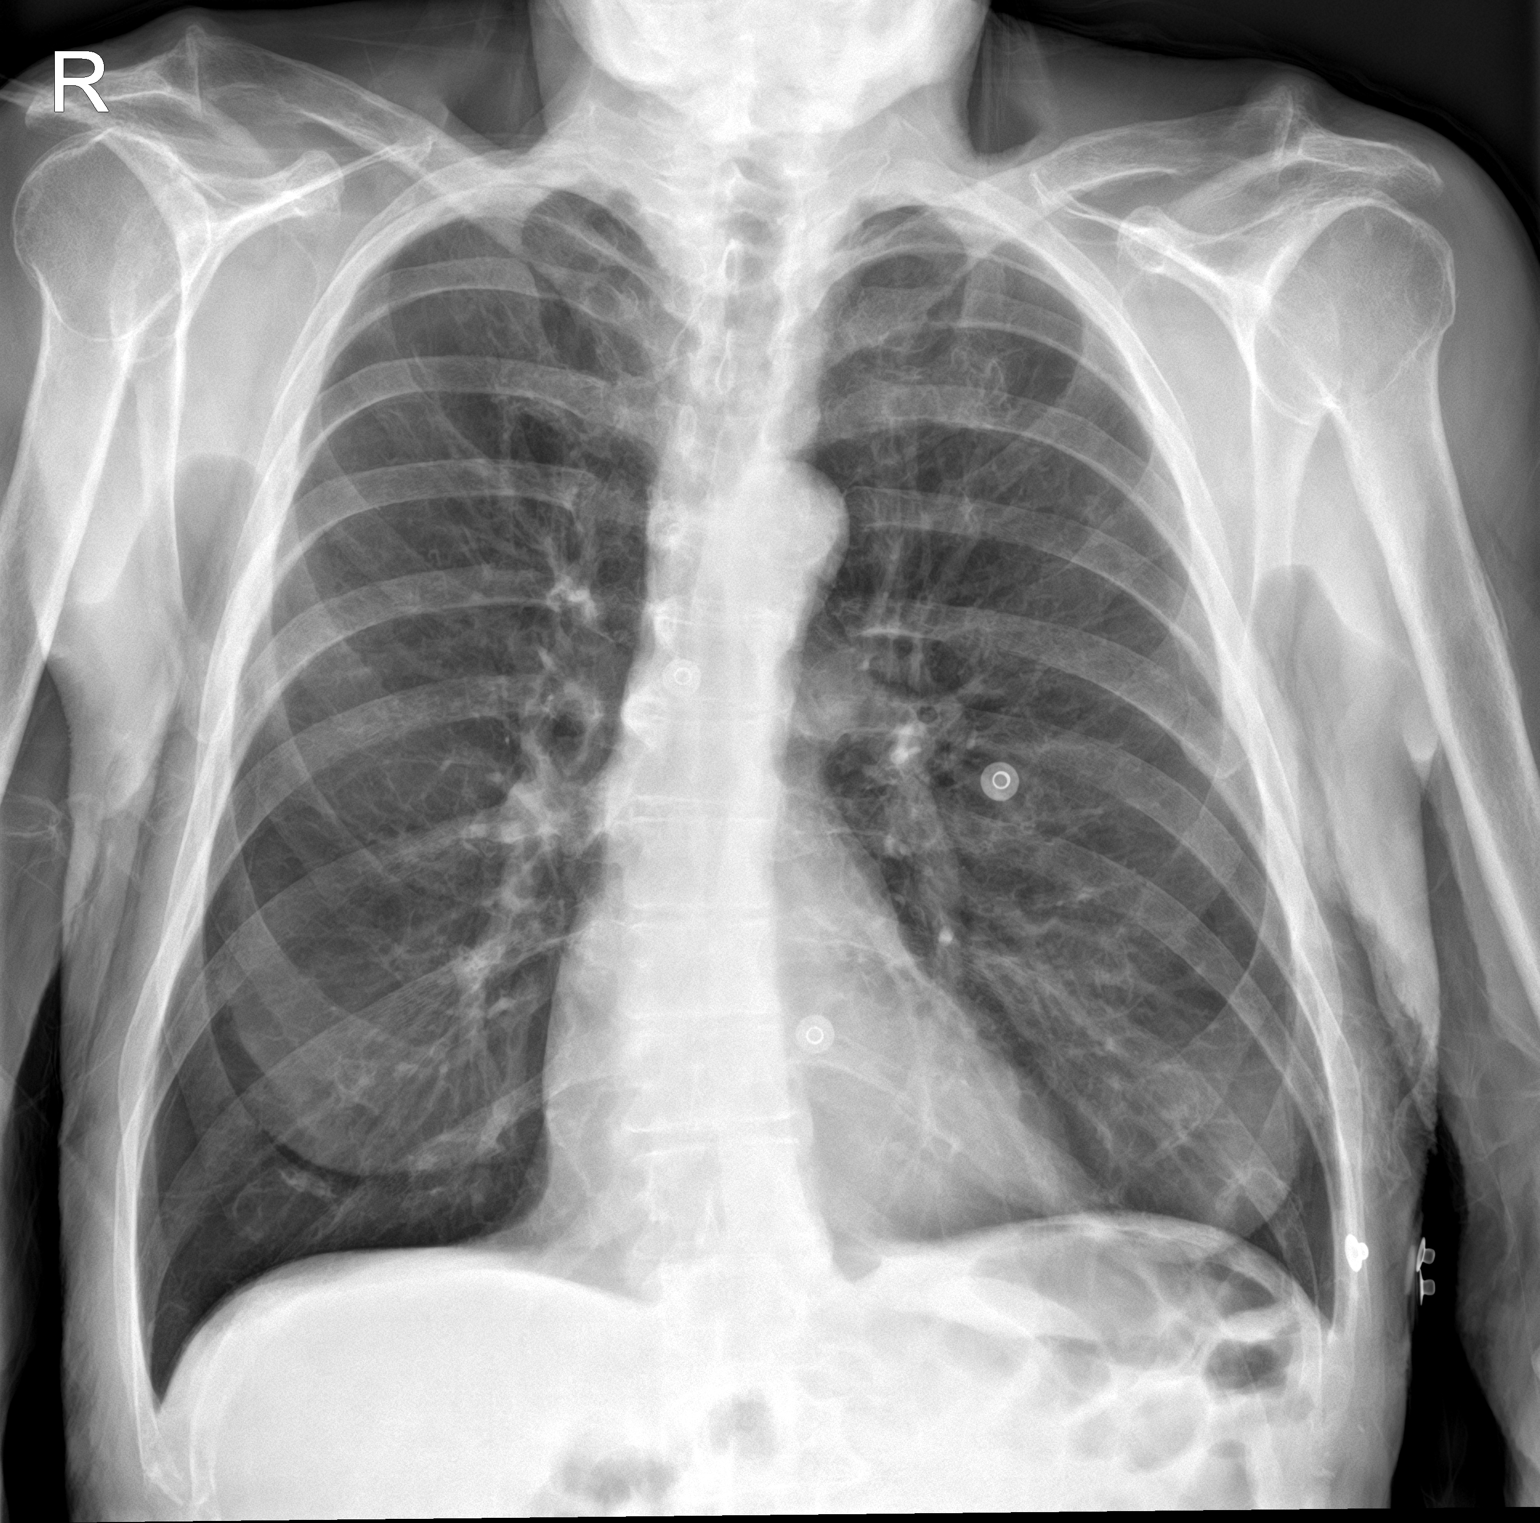

[chest lat]
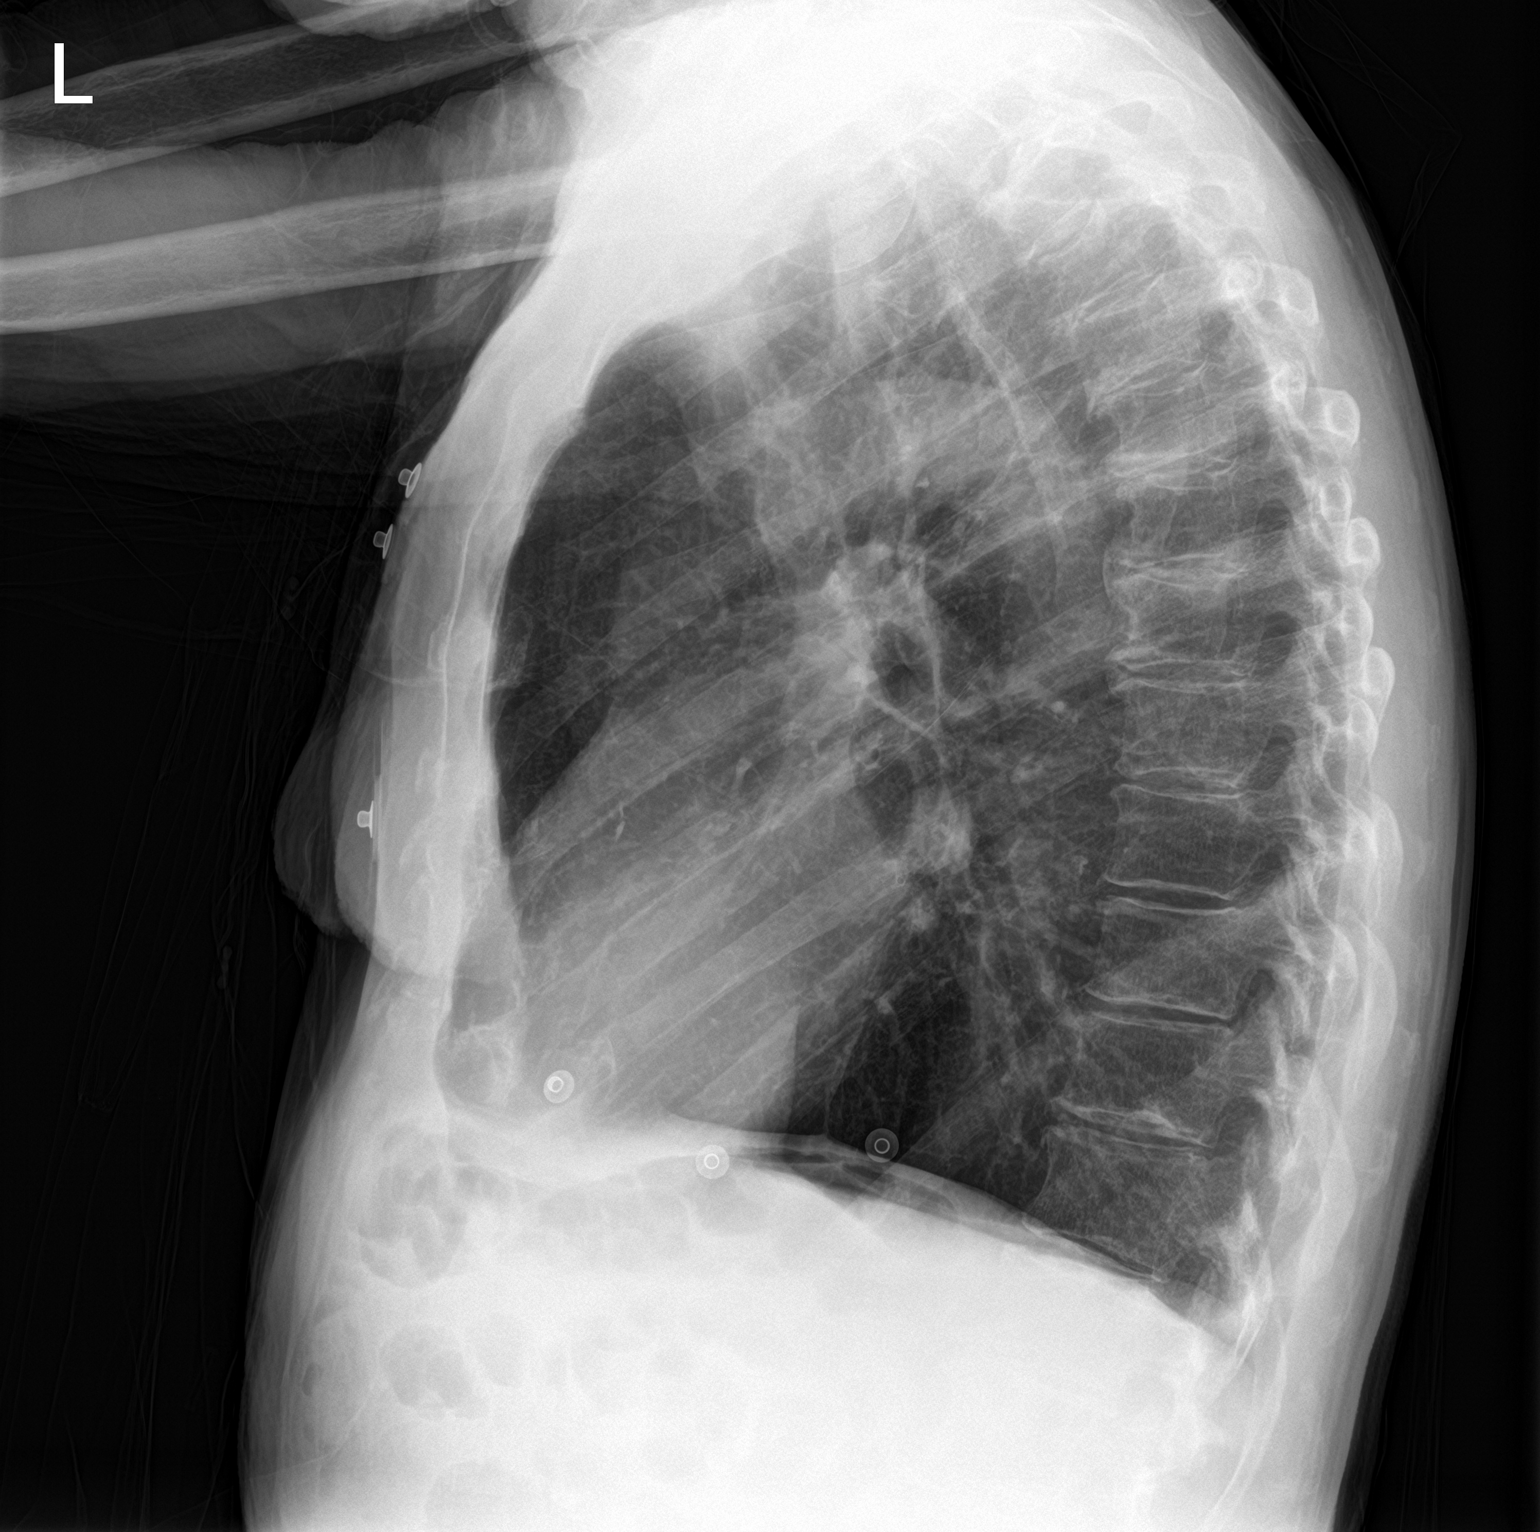

[2 of 2 positions shown; findings below may reference images not displayed]

FINDINGS: Chronic hyperinflation of the lungs with stable chronic bronchitic
change. No consolidative opacity, convincing features of edema,
pneumothorax or effusion. The aorta is calcified. The remaining
cardiomediastinal contours are unremarkable. Degenerative changes
are present in the imaged spine and shoulders. No acute osseous or
soft tissue abnormality.
IMPRESSION: 1. No acute cardiopulmonary findings.
2. Chronic hyperinflation and bronchitic changes may reflect
features of COPD/chronic bronchitis. Correlate with history.
3. Aortic atherosclerosis.
